# Patient Record
Sex: Male | Born: 1935 | Race: White | Hispanic: No | Marital: Married | Smoking: Former smoker
Health system: Southern US, Community
[De-identification: ages and names within clinical notes are randomized; demographics above are authoritative.]

## PROBLEM LIST (undated history)

## (undated) DIAGNOSIS — I10 Essential (primary) hypertension: Secondary | ICD-10-CM

## (undated) DIAGNOSIS — J984 Other disorders of lung: Secondary | ICD-10-CM

## (undated) DIAGNOSIS — M5030 Other cervical disc degeneration, unspecified cervical region: Secondary | ICD-10-CM

## (undated) DIAGNOSIS — I2510 Atherosclerotic heart disease of native coronary artery without angina pectoris: Secondary | ICD-10-CM

## (undated) DIAGNOSIS — D3500 Benign neoplasm of unspecified adrenal gland: Secondary | ICD-10-CM

## (undated) DIAGNOSIS — E785 Hyperlipidemia, unspecified: Secondary | ICD-10-CM

## (undated) DIAGNOSIS — I4891 Unspecified atrial fibrillation: Secondary | ICD-10-CM

## (undated) DIAGNOSIS — R911 Solitary pulmonary nodule: Secondary | ICD-10-CM

## (undated) DIAGNOSIS — I509 Heart failure, unspecified: Secondary | ICD-10-CM

## (undated) HISTORY — PX: REPLACEMENT TOTAL KNEE BILATERAL: SUR1225

## (undated) HISTORY — PX: CATARACT EXTRACTION, BILATERAL: SHX1313

## (undated) HISTORY — PX: EXPLORATION POST OPERATIVE OPEN HEART: SHX5061

## (undated) HISTORY — DX: Heart failure, unspecified: I50.9

## (undated) HISTORY — PX: CORONARY ARTERY BYPASS GRAFT: SHX141

## (undated) HISTORY — PX: CARDIAC SURGERY: SHX584

## (undated) HISTORY — PX: LUMBAR FUSION: SHX111

## (undated) HISTORY — DX: Essential (primary) hypertension: I10

---

## 2002-02-14 ENCOUNTER — Ambulatory Visit: Payer: Self-pay | Admitting: Unknown Physician Specialty

## 2005-01-19 ENCOUNTER — Ambulatory Visit: Payer: Self-pay | Admitting: *Deleted

## 2005-04-13 ENCOUNTER — Ambulatory Visit: Payer: Self-pay | Admitting: Internal Medicine

## 2005-09-02 ENCOUNTER — Ambulatory Visit: Payer: Self-pay | Admitting: Internal Medicine

## 2005-11-18 ENCOUNTER — Encounter: Payer: Self-pay | Admitting: Internal Medicine

## 2005-12-03 ENCOUNTER — Encounter: Payer: Self-pay | Admitting: Internal Medicine

## 2006-01-02 ENCOUNTER — Encounter: Payer: Self-pay | Admitting: Internal Medicine

## 2006-02-02 ENCOUNTER — Encounter: Payer: Self-pay | Admitting: Internal Medicine

## 2006-03-05 ENCOUNTER — Encounter: Payer: Self-pay | Admitting: Internal Medicine

## 2006-04-03 ENCOUNTER — Encounter: Payer: Self-pay | Admitting: Internal Medicine

## 2008-05-31 ENCOUNTER — Ambulatory Visit: Payer: Self-pay | Admitting: Unknown Physician Specialty

## 2010-09-03 ENCOUNTER — Ambulatory Visit: Payer: Self-pay | Admitting: Internal Medicine

## 2012-06-09 ENCOUNTER — Ambulatory Visit: Payer: Self-pay | Admitting: Internal Medicine

## 2013-06-19 ENCOUNTER — Other Ambulatory Visit: Payer: Self-pay | Admitting: Internal Medicine

## 2013-06-19 LAB — TROPONIN I: Troponin-I: 0.03 ng/mL

## 2013-06-23 ENCOUNTER — Ambulatory Visit: Payer: Self-pay | Admitting: Internal Medicine

## 2013-07-03 ENCOUNTER — Ambulatory Visit: Payer: Self-pay | Admitting: Internal Medicine

## 2013-07-07 ENCOUNTER — Ambulatory Visit: Payer: Self-pay | Admitting: Cardiothoracic Surgery

## 2013-07-12 LAB — CBC CANCER CENTER
Basophil #: 0 x10 3/mm (ref 0.0–0.1)
Basophil %: 0.5 %
EOS ABS: 0 x10 3/mm (ref 0.0–0.7)
EOS PCT: 0.1 %
HCT: 46.6 % (ref 40.0–52.0)
HGB: 15.5 g/dL (ref 13.0–18.0)
LYMPHS ABS: 1.2 x10 3/mm (ref 1.0–3.6)
LYMPHS PCT: 13.2 %
MCH: 32.5 pg (ref 26.0–34.0)
MCHC: 33.3 g/dL (ref 32.0–36.0)
MCV: 98 fL (ref 80–100)
Monocyte #: 0.5 x10 3/mm (ref 0.2–1.0)
Monocyte %: 5.9 %
Neutrophil #: 7.4 x10 3/mm — ABNORMAL HIGH (ref 1.4–6.5)
Neutrophil %: 80.3 %
Platelet: 186 x10 3/mm (ref 150–440)
RBC: 4.78 10*6/uL (ref 4.40–5.90)
RDW: 13.5 % (ref 11.5–14.5)
WBC: 9.2 x10 3/mm (ref 3.8–10.6)

## 2013-07-12 LAB — COMPREHENSIVE METABOLIC PANEL
ALBUMIN: 4 g/dL (ref 3.4–5.0)
ALK PHOS: 94 U/L
ANION GAP: 7 (ref 7–16)
BILIRUBIN TOTAL: 0.9 mg/dL (ref 0.2–1.0)
BUN: 15 mg/dL (ref 7–18)
CHLORIDE: 104 mmol/L (ref 98–107)
CREATININE: 0.68 mg/dL (ref 0.60–1.30)
Calcium, Total: 9.5 mg/dL (ref 8.5–10.1)
Co2: 29 mmol/L (ref 21–32)
EGFR (African American): >60
EGFR (Non-African Amer.): >60
GLUCOSE: 116 mg/dL — AB (ref 65–99)
OSMOLALITY: 281 (ref 275–301)
Potassium: 4 mmol/L (ref 3.5–5.1)
SGOT(AST): 16 U/L (ref 15–37)
SGPT (ALT): 28 U/L (ref 12–78)
Sodium: 140 mmol/L (ref 136–145)
Total Protein: 7.8 g/dL (ref 6.4–8.2)

## 2013-08-02 ENCOUNTER — Ambulatory Visit: Payer: Self-pay | Admitting: Cardiothoracic Surgery

## 2013-08-23 ENCOUNTER — Ambulatory Visit: Payer: Self-pay | Admitting: Internal Medicine

## 2013-11-16 ENCOUNTER — Ambulatory Visit: Payer: Self-pay | Admitting: Specialist

## 2015-01-07 ENCOUNTER — Other Ambulatory Visit
Admission: RE | Admit: 2015-01-07 | Discharge: 2015-01-07 | Disposition: A | Discharge status: Home / Self Care | Payer: Medicare Other | Source: Ambulatory Visit | Attending: Unknown Physician Specialty | Admitting: Unknown Physician Specialty

## 2015-01-07 DIAGNOSIS — Z96659 Presence of unspecified artificial knee joint: Secondary | ICD-10-CM | POA: Insufficient documentation

## 2015-01-07 LAB — SYNOVIAL CELL COUNT + DIFF, W/ CRYSTALS
Crystals, Fluid: NONE SEEN
Eosinophils-Synovial: 0 %
LYMPHOCYTES-SYNOVIAL FLD: 32 %
Monocyte-Macrophage-Synovial Fluid: 12 %
Neutrophil, Synovial: 56 %
Other Cells-SYN: 0
WBC, SYNOVIAL: 251 /mm3 — AB (ref 0–200)

## 2015-01-11 LAB — BODY FLUID CULTURE: Culture: NO GROWTH

## 2015-01-12 IMAGING — PT NM PET TUM IMG SKULL BASE T - THIGH
1 of 10 series · 1 of 25 positions shown · non-contrast
Comparison: 06/23/2013

CLINICAL DATA: Initial treatment strategy for lung nodule.

EXAM:
NUCLEAR MEDICINE PET SKULL BASE TO THIGH
TECHNIQUE: 12.9 mCi F-18 FDG was injected intravenously. Full-ring PET imaging
was performed from the skull base to thigh after the radiotracer. CT
data was obtained and used for attenuation correction and anatomic
localization.
FASTING BLOOD GLUCOSE:  Value: 102 mg/dl

[Series 3: ct wb 5.0 b30f · axial · 5.0mm · 0.98mm/px · 1 of 329 slices shown]
[im 329/329  brain]
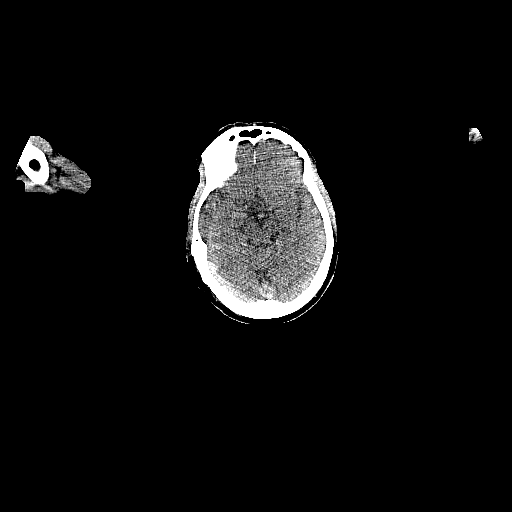

[1 of 25 positions shown; findings below may reference images not displayed]

FINDINGS: NECK

No hypermetabolic lymph nodes in the neck.

CHEST

9 mm right paratracheal lymph node has an SUV max equal to 4.1.
Pre-vascular lymph node measures 1 cm and has an SUV max equal to
3.7, image 80. SUV max associated with the right hilar lymph node is
equal to 3.7, image 90. The heart size appears enlarged. The patient
is status post median sternotomy and CABG procedure. Non-union
deformity involving the sternum is noted.

No pleural effusion identified. Right upper lobe pulmonary nodule
measures 1.5 cm, image 81/ series 3. The SUV max associated with
this is equal to 2.7, image 80. This is above background activity
within the lung parenchyma which is equal to approximately 1.3. The
SUV max associated with the right lower lobe subpleural nodule is
equal to 1.5, image 83.

ABDOMEN/PELVIS

No abnormal hypermetabolic activity within the liver, pancreas,
adrenal glands, or spleen. Stones identified within the gallbladder.
No hypermetabolic lymph nodes in the abdomen or pelvis.

SKELETON

No focal hypermetabolic activity to suggest skeletal metastasis.
IMPRESSION: 1. There is low level, malignant range FDG uptake associated with
the right upper lobe pulmonary nodule. Given the increase in size
from [DATE] this is worrisome for low grade pulmonary neoplasm.
2. Increased FDG uptake associated with right hilar and mediastinal
lymph nodes. The degree of uptake is as high at 1.5 times that of
background blood pool activity. Cannot rule out metastatic
adenopathy.
3. Atherosclerotic disease
4. Nonunion of the sternotomy defect.

## 2015-09-20 ENCOUNTER — Ambulatory Visit
Admission: RE | Admit: 2015-09-20 | Discharge: 2015-09-20 | Disposition: A | Discharge status: Home / Self Care | Payer: Medicare Other | Source: Ambulatory Visit | Attending: Internal Medicine | Admitting: Internal Medicine

## 2015-09-20 ENCOUNTER — Other Ambulatory Visit: Payer: Self-pay | Admitting: Internal Medicine

## 2015-09-20 DIAGNOSIS — R0602 Shortness of breath: Secondary | ICD-10-CM

## 2015-09-20 DIAGNOSIS — K8020 Calculus of gallbladder without cholecystitis without obstruction: Secondary | ICD-10-CM | POA: Insufficient documentation

## 2015-09-20 DIAGNOSIS — R109 Unspecified abdominal pain: Secondary | ICD-10-CM

## 2015-09-20 DIAGNOSIS — I700 Atherosclerosis of aorta: Secondary | ICD-10-CM | POA: Insufficient documentation

## 2015-09-20 DIAGNOSIS — M4640 Discitis, unspecified, site unspecified: Secondary | ICD-10-CM | POA: Insufficient documentation

## 2015-09-20 DIAGNOSIS — M5135 Other intervertebral disc degeneration, thoracolumbar region: Secondary | ICD-10-CM | POA: Insufficient documentation

## 2015-09-20 DIAGNOSIS — M4646 Discitis, unspecified, lumbar region: Secondary | ICD-10-CM

## 2015-10-09 ENCOUNTER — Other Ambulatory Visit: Payer: Self-pay | Admitting: Internal Medicine

## 2015-10-09 DIAGNOSIS — M545 Low back pain: Secondary | ICD-10-CM

## 2015-12-10 ENCOUNTER — Other Ambulatory Visit: Payer: Self-pay | Admitting: Neurological Surgery

## 2015-12-10 DIAGNOSIS — M545 Low back pain, unspecified: Secondary | ICD-10-CM

## 2015-12-10 DIAGNOSIS — G8929 Other chronic pain: Secondary | ICD-10-CM

## 2015-12-12 ENCOUNTER — Ambulatory Visit: Payer: Medicare Other

## 2015-12-24 ENCOUNTER — Other Ambulatory Visit (INDEPENDENT_AMBULATORY_CARE_PROVIDER_SITE_OTHER): Payer: Self-pay | Admitting: Vascular Surgery

## 2015-12-24 ENCOUNTER — Encounter (INDEPENDENT_AMBULATORY_CARE_PROVIDER_SITE_OTHER): Payer: Self-pay

## 2015-12-24 ENCOUNTER — Ambulatory Visit (INDEPENDENT_AMBULATORY_CARE_PROVIDER_SITE_OTHER): Payer: Medicare Other | Admitting: Vascular Surgery

## 2015-12-24 ENCOUNTER — Encounter (INDEPENDENT_AMBULATORY_CARE_PROVIDER_SITE_OTHER): Payer: Self-pay | Admitting: Vascular Surgery

## 2015-12-24 VITALS — BP 181/94 | HR 82 | Resp 16 | Ht 72.0 in | Wt 255.0 lb

## 2015-12-24 DIAGNOSIS — I10 Essential (primary) hypertension: Secondary | ICD-10-CM | POA: Diagnosis not present

## 2015-12-24 DIAGNOSIS — I25810 Atherosclerosis of coronary artery bypass graft(s) without angina pectoris: Secondary | ICD-10-CM | POA: Diagnosis not present

## 2015-12-24 DIAGNOSIS — I7025 Atherosclerosis of native arteries of other extremities with ulceration: Secondary | ICD-10-CM

## 2015-12-24 DIAGNOSIS — I509 Heart failure, unspecified: Secondary | ICD-10-CM

## 2015-12-24 DIAGNOSIS — I499 Cardiac arrhythmia, unspecified: Secondary | ICD-10-CM | POA: Insufficient documentation

## 2015-12-24 DIAGNOSIS — I4891 Unspecified atrial fibrillation: Secondary | ICD-10-CM | POA: Diagnosis not present

## 2015-12-24 NOTE — Assessment & Plan Note (Signed)
Has an appointment with cardiology tomorrow.

## 2015-12-24 NOTE — Assessment & Plan Note (Signed)
blood pressure control important in reducing the progression of atherosclerotic disease. On appropriate oral medications.  

## 2015-12-24 NOTE — Assessment & Plan Note (Signed)
On anticoagulation 

## 2015-12-24 NOTE — Patient Instructions (Signed)

## 2015-12-24 NOTE — Assessment & Plan Note (Signed)
The patient has ulcerations of both feet. His ulcerations are more severe on the right great and second toe than on the left, but on the left he has 3 small heel ulcerations which are more painful. His pedal pulse exam is poor and his foot has the appearance of chronic ischemia. This is a very worrisome and limb threatening situation and a very ill patient with multiple medical issues. Given the situation, limb threat is clearly present and he is very concerned about losing his legs. I have discussed options for treatment including expediting an angiogram. I discussed an angiogram would be used for one lower extremity at a time, and would be both diagnostic and potentially therapeutic. I discussed the risks and benefits of the procedure. I have discussed the gravity of the situation and discussed with the patient that even with revascularization, tissue loss is possible. The patient has a large list of medical comorbidities which make the situation more worrisome. He and his wife have decided to proceed with angiography and possible revascularization. Risks and benefits were discussed.

## 2015-12-24 NOTE — Progress Notes (Signed)
Patient ID: Norman Morales, male   DOB: 16-Sep-1926, 82 y.o.   MRN: [id number]  Chief Complaint  Patient presents with  . New Patient (Initial Visit)    HPI Norman Morales is a 82 y.o. male.  I am asked to see the patient by Caryl Comes for evaluation of lower extremity ischemia with ulceration.  The patient reports several ulcerations on both feet that have started over the past several weeks. He said he had an episode of worsening swelling of his legs likely associated with his congestive heart failure, and has had worsening of the ulceration since that time. Nothing seems to be making them better. The patient has ulcerations of both feet. His ulcerations are more severe on the right great and second toe than on the left, but on the left he has 3 small heel ulcerations which are more painful. His feet are very purplish discolored. He reports no trauma or injury. He does not have any fever or chills or signs systemic infection.   Past Medical History:  Diagnosis Date  . CHF (congestive heart failure) ([place])   . Hypertension     Past Surgical History:  Procedure Laterality Date  . CATARACT EXTRACTION, BILATERAL    . EXPLORATION POST OPERATIVE OPEN HEART    . LUMBAR FUSION    . REPLACEMENT TOTAL KNEE BILATERAL      Family History  Problem Relation Age of Onset  . Aortic aneurysm Mother   No bleeding disorders, clotting disorders, or autoimmune diseases  Social History Social History  Substance Use Topics  . Smoking status: Former Research scientist (life sciences)  . Smokeless tobacco: Never Used  . Alcohol use Yes     Comment: occasionally  Married, lives with wife. Retired physical therapist  No Known Allergies  Current Outpatient Prescriptions  Medication Sig Dispense Refill  . albuterol (PROVENTIL HFA;VENTOLIN HFA) 108 (90 Base) MCG/ACT inhaler INHALE TWO INHALATIONS INTO THE LUNGS EVERY 6 HOURS AS NEEDED FOR WHEEZING    . allopurinol (ZYLOPRIM) 100 MG tablet TAKE 2 TABLETS BY MOUTH EVERY DAY AS  DIRECTED    . aspirin EC 81 MG tablet Take 81 mg by mouth.    Marland Kitchen atorvastatin (LIPITOR) 80 MG tablet TAKE ONE TABLET AT BEDTIME    . azelastine (ASTELIN) 0.1 % nasal spray Place into the nose.    . bumetanide (BUMEX) 2 MG tablet TAKE 1 TABLET BY MOUTH EVERY DAY.    Marland Kitchen Cyanocobalamin (RA VITAMIN B-12 TR) 1000 MCG TBCR Take by mouth.    Arne Cleveland 5 MG TABS tablet     . metoprolol tartrate (LOPRESSOR) 25 MG tablet     . spironolactone (ALDACTONE) 25 MG tablet      No current facility-administered medications for this visit.       REVIEW OF SYSTEMS (Negative unless checked)  Constitutional: [] Weight loss  [] Fever  [] Chills Cardiac: [] Chest pain   [] Chest pressure   [x] Palpitations   [x] Shortness of breath when laying flat   [] Shortness of breath at rest   [x] Shortness of breath with exertion. Vascular:  [] Pain in legs with walking   [] Pain in legs at rest   [] Pain in legs when laying flat   [] Claudication   [x] Pain in feet when walking  [] Pain in feet at rest  [] Pain in feet when laying flat   [] History of DVT   [] Phlebitis   [x] Swelling in legs   [] Varicose veins   [] Non-healing ulcers Pulmonary:   [] Uses home oxygen   [] Productive cough   []   Hemoptysis   [] Wheeze  [] COPD   [] Asthma Neurologic:  [] Dizziness  [] Blackouts   [] Seizures   [] History of stroke   [] History of TIA  [] Aphasia   [] Temporary blindness   [] Dysphagia   [] Weakness or numbness in arms   [] Weakness or numbness in legs Musculoskeletal:  [] Arthritis   [] Joint swelling   [x] Joint pain   [] Low back pain Hematologic:  [] Easy bruising  [] Easy bleeding   [] Hypercoagulable state   [] Anemic  [] Hepatitis Gastrointestinal:  [] Blood in stool   [] Vomiting blood  [] Gastroesophageal reflux/heartburn   [] Abdominal pain Genitourinary:  [] Chronic kidney disease   [] Difficult urination  [] Frequent urination  [] Burning with urination   [] Hematuria Skin:  [] Rashes   [x] Ulcers   [x] Wounds Psychological:  [] History of anxiety   []  History of major  depression.    Physical Exam BP (!) 181/94   Pulse 82   Resp 16   Ht 6' (1.829 m)   Wt 255 lb (115.7 kg)   BMI 34.58 kg/m  Gen:  WD/WN, NAD Head: [place]/AT, No temporalis wasting. Prominent temp pulse not noted. Ear/Nose/Throat: Hearing grossly intact, nares w/o erythema or drainage, oropharynx w/o Erythema/Exudate Eyes: Conjunctiva clear, sclera non-icteric  Neck: trachea midline.  No bruit or JVD.  Pulmonary:  Good air movement, clear to auscultation bilaterally.  Cardiac: Irregularly irregular Vascular:  Vessel Right Left  Radial Palpable Palpable  Ulnar Palpable Palpable  Brachial Palpable Palpable  Carotid Palpable, without bruit Palpable, without bruit  Aorta Not palpable N/A  Femoral Palpable Palpable  Popliteal Not Palpable Not Palpable  PT Trace Palpable Not Palpable  DP Trace Palpable 1+ Palpable   Gastrointestinal: soft, non-tender/non-distended. No guarding/reflex. No masses, surgical incisions, or scars. Musculoskeletal: M/S 5/5 throughout.  Clean dry ulcerations as described above on both feet.  No deformity or atrophy. Trace right lower extremity edema and 1-2+ left lower extremity edema. Neurologic: Sensation grossly intact in extremities.  Symmetrical.  Speech is fluent. Motor exam as listed above. Psychiatric: Judgment intact, Mood & affect appropriate for pt's clinical situation. Dermatologic: Clean dry ulcerations on both feet as described above. No erythema or drainage. Lymph : No Cervical, Axillary, or Inguinal lymphadenopathy.   Radiology No results found.  Labs No results found for this or any previous visit (from the past 2160 hour(s)).  Assessment/Plan:  Arrhythmia On anticoagulation  Essential hypertension blood pressure control important in reducing the progression of atherosclerotic disease. On appropriate oral medications.   Congestive heart failure (HCC) On diuretics but still has issues with swelling which can exacerbate his lower  extremity ulcerations. Very worrisome situation. Overall perfusion could be impaired to his feet from cardiac dysfunction as well.  Atherosclerosis of native arteries of the extremities with ulceration ([place]) The patient has ulcerations of both feet. His ulcerations are more severe on the right great and second toe than on the left, but on the left he has 3 small heel ulcerations which are more painful. His pedal pulse exam is poor and his foot has the appearance of chronic ischemia. This is a very worrisome and limb threatening situation and a very ill patient with multiple medical issues. Given the situation, limb threat is clearly present and he is very concerned about losing his legs. I have discussed options for treatment including expediting an angiogram. I discussed an angiogram would be used for one lower extremity at a time, and would be both diagnostic and potentially therapeutic. I discussed the risks and benefits of the procedure. I have discussed the  gravity of the situation and discussed with the patient that even with revascularization, tissue loss is possible. The patient has a large list of medical comorbidities which make the situation more worrisome. He and his wife have decided to proceed with angiography and possible revascularization. Risks and benefits were discussed.  CAD (coronary artery disease) of artery bypass graft Has an appointment with cardiology tomorrow.      Leotis Pain 12/24/2015, 10:09 AM   This note was created with Dragon medical transcription system.  Any errors from dictation are unintentional.

## 2015-12-24 NOTE — Assessment & Plan Note (Signed)
On diuretics but still has issues with swelling which can exacerbate his lower extremity ulcerations. Very worrisome situation. Overall perfusion could be impaired to his feet from cardiac dysfunction as well.

## 2015-12-25 ENCOUNTER — Inpatient Hospital Stay: Admission: RE | Admit: 2015-12-25 | Payer: Medicare Other | Source: Ambulatory Visit

## 2015-12-27 ENCOUNTER — Other Ambulatory Visit
Admission: RE | Admit: 2015-12-27 | Discharge: 2015-12-27 | Disposition: A | Discharge status: Home / Self Care | Payer: Medicare Other | Source: Ambulatory Visit | Attending: Vascular Surgery | Admitting: Vascular Surgery

## 2015-12-27 DIAGNOSIS — I739 Peripheral vascular disease, unspecified: Secondary | ICD-10-CM | POA: Diagnosis present

## 2015-12-27 LAB — CREATININE, SERUM
Creatinine, Ser: 1.1 mg/dL (ref 0.61–1.24)
GFR calc Af Amer: >60 mL/min (ref >60)
GFR calc non Af Amer: 58 mL/min — ABNORMAL LOW (ref >60)

## 2015-12-27 LAB — BUN: BUN: 28 mg/dL — AB (ref 6–20)

## 2015-12-30 ENCOUNTER — Encounter: Payer: Self-pay | Admitting: *Deleted

## 2015-12-30 ENCOUNTER — Encounter
Admission: RE | Disposition: A | Discharge status: Home / Self Care | Payer: Self-pay | Source: Ambulatory Visit | Attending: Vascular Surgery

## 2015-12-30 ENCOUNTER — Ambulatory Visit
Admission: RE | Admit: 2015-12-30 | Discharge: 2015-12-30 | Disposition: A | Discharge status: Home / Self Care | Payer: Medicare Other | Source: Ambulatory Visit | Attending: Vascular Surgery | Admitting: Vascular Surgery

## 2015-12-30 DIAGNOSIS — Z981 Arthrodesis status: Secondary | ICD-10-CM | POA: Insufficient documentation

## 2015-12-30 DIAGNOSIS — I509 Heart failure, unspecified: Secondary | ICD-10-CM | POA: Diagnosis not present

## 2015-12-30 DIAGNOSIS — Z9841 Cataract extraction status, right eye: Secondary | ICD-10-CM | POA: Insufficient documentation

## 2015-12-30 DIAGNOSIS — I7025 Atherosclerosis of native arteries of other extremities with ulceration: Secondary | ICD-10-CM | POA: Insufficient documentation

## 2015-12-30 DIAGNOSIS — I110 Hypertensive heart disease with heart failure: Secondary | ICD-10-CM | POA: Diagnosis not present

## 2015-12-30 DIAGNOSIS — Z7982 Long term (current) use of aspirin: Secondary | ICD-10-CM | POA: Insufficient documentation

## 2015-12-30 DIAGNOSIS — Z96653 Presence of artificial knee joint, bilateral: Secondary | ICD-10-CM | POA: Diagnosis not present

## 2015-12-30 DIAGNOSIS — Z8249 Family history of ischemic heart disease and other diseases of the circulatory system: Secondary | ICD-10-CM | POA: Insufficient documentation

## 2015-12-30 DIAGNOSIS — Z87891 Personal history of nicotine dependence: Secondary | ICD-10-CM | POA: Insufficient documentation

## 2015-12-30 DIAGNOSIS — I2510 Atherosclerotic heart disease of native coronary artery without angina pectoris: Secondary | ICD-10-CM | POA: Insufficient documentation

## 2015-12-30 DIAGNOSIS — I70248 Atherosclerosis of native arteries of left leg with ulceration of other part of lower left leg: Secondary | ICD-10-CM

## 2015-12-30 DIAGNOSIS — Z9842 Cataract extraction status, left eye: Secondary | ICD-10-CM | POA: Insufficient documentation

## 2015-12-30 HISTORY — DX: Atherosclerotic heart disease of native coronary artery without angina pectoris: I25.10

## 2015-12-30 HISTORY — DX: Hyperlipidemia, unspecified: E78.5

## 2015-12-30 HISTORY — PX: PERIPHERAL VASCULAR CATHETERIZATION: SHX172C

## 2015-12-30 SURGERY — LOWER EXTREMITY ANGIOGRAPHY
Anesthesia: Moderate Sedation | Site: Leg Lower | Laterality: Left

## 2015-12-30 MED ORDER — HEPARIN (PORCINE) IN NACL 2-0.9 UNIT/ML-% IJ SOLN
INTRAMUSCULAR | Status: AC
  Filled 2015-12-30: qty 1000

## 2015-12-30 MED ORDER — HEPARIN SODIUM (PORCINE) 1000 UNIT/ML IJ SOLN
INTRAMUSCULAR | Status: AC
  Filled 2015-12-30: qty 1

## 2015-12-30 MED ORDER — FENTANYL CITRATE (PF) 100 MCG/2ML IJ SOLN
INTRAMUSCULAR | Status: AC
  Filled 2015-12-30: qty 2

## 2015-12-30 MED ORDER — IPRATROPIUM-ALBUTEROL 0.5-2.5 (3) MG/3ML IN SOLN
RESPIRATORY_TRACT | Status: AC
  Filled 2015-12-30: qty 3

## 2015-12-30 MED ORDER — ONDANSETRON HCL 4 MG/2ML IJ SOLN: 4.0000 mg | Freq: Four times a day (QID) | INTRAMUSCULAR | Status: DC | PRN

## 2015-12-30 MED ORDER — GUAIFENESIN-DM 100-10 MG/5ML PO SYRP: 15.0000 mL | ORAL_SOLUTION | ORAL | Status: DC | PRN

## 2015-12-30 MED ORDER — OXYCODONE-ACETAMINOPHEN 5-325 MG PO TABS
1.0000 | ORAL_TABLET | ORAL | Status: DC | PRN
  Administered 2015-12-30: 1 via ORAL

## 2015-12-30 MED ORDER — MIDAZOLAM HCL 2 MG/2ML IJ SOLN
INTRAMUSCULAR | Status: DC | PRN
  Administered 2015-12-30 (×3): 1 mg via INTRAVENOUS
  Administered 2015-12-30: 0.5 mg via INTRAVENOUS
  Administered 2015-12-30: 2 mg via INTRAVENOUS
  Administered 2015-12-30: 1 mg via INTRAVENOUS
  Administered 2015-12-30: 0.5 mg via INTRAVENOUS

## 2015-12-30 MED ORDER — LIDOCAINE HCL (PF) 1 % IJ SOLN
INTRAMUSCULAR | Status: AC
  Filled 2015-12-30: qty 10

## 2015-12-30 MED ORDER — ACETAMINOPHEN 325 MG PO TABS: 325.0000 mg | ORAL_TABLET | ORAL | Status: DC | PRN

## 2015-12-30 MED ORDER — IOPAMIDOL (ISOVUE-300) INJECTION 61%
INTRAVENOUS | Status: DC | PRN
  Administered 2015-12-30: 75 mL via INTRA_ARTERIAL

## 2015-12-30 MED ORDER — METOPROLOL TARTRATE 5 MG/5ML IV SOLN: 2.0000 mg | INTRAVENOUS | Status: DC | PRN

## 2015-12-30 MED ORDER — OXYCODONE-ACETAMINOPHEN 5-325 MG PO TABS
ORAL_TABLET | ORAL | Status: AC
  Filled 2015-12-30: qty 1

## 2015-12-30 MED ORDER — HEPARIN SODIUM (PORCINE) 1000 UNIT/ML IJ SOLN
INTRAMUSCULAR | Status: DC | PRN
  Administered 2015-12-30: 5000 [IU] via INTRAVENOUS

## 2015-12-30 MED ORDER — HYDRALAZINE HCL 20 MG/ML IJ SOLN: 5.0000 mg | INTRAMUSCULAR | Status: DC | PRN

## 2015-12-30 MED ORDER — ACETAMINOPHEN 325 MG RE SUPP
325.0000 mg | RECTAL | Status: DC | PRN
Start: 2015-12-30 — End: 2015-12-30
  Filled 2015-12-30: qty 2

## 2015-12-30 MED ORDER — LABETALOL HCL 5 MG/ML IV SOLN: 10.0000 mg | INTRAVENOUS | Status: DC | PRN

## 2015-12-30 MED ORDER — METHYLPREDNISOLONE SODIUM SUCC 125 MG IJ SOLR: 125.0000 mg | INTRAMUSCULAR | Status: DC | PRN

## 2015-12-30 MED ORDER — SODIUM CHLORIDE 0.9 % IV SOLN: 500.0000 mL | Freq: Once | INTRAVENOUS | Status: DC | PRN

## 2015-12-30 MED ORDER — DEXTROSE 5 % IV SOLN
1.5000 g | INTRAVENOUS | Status: AC
  Administered 2015-12-30: 1.5 g via INTRAVENOUS

## 2015-12-30 MED ORDER — SODIUM CHLORIDE 0.9 % IV SOLN
INTRAVENOUS | Status: DC
  Administered 2015-12-30: 11:00:00 via INTRAVENOUS

## 2015-12-30 MED ORDER — MIDAZOLAM HCL 5 MG/5ML IJ SOLN
INTRAMUSCULAR | Status: AC
  Filled 2015-12-30: qty 5

## 2015-12-30 MED ORDER — IPRATROPIUM-ALBUTEROL 0.5-2.5 (3) MG/3ML IN SOLN
3.0000 mL | Freq: Once | RESPIRATORY_TRACT | Status: AC
  Administered 2015-12-30: 3 mL via RESPIRATORY_TRACT

## 2015-12-30 MED ORDER — MIDAZOLAM HCL 2 MG/2ML IJ SOLN
INTRAMUSCULAR | Status: AC
  Filled 2015-12-30: qty 2

## 2015-12-30 MED ORDER — HYDROMORPHONE HCL 1 MG/ML IJ SOLN: 0.5000 mg | INTRAMUSCULAR | Status: DC | PRN

## 2015-12-30 MED ORDER — PHENOL 1.4 % MT LIQD: 1.0000 | OROMUCOSAL | Status: DC | PRN

## 2015-12-30 MED ORDER — FENTANYL CITRATE (PF) 100 MCG/2ML IJ SOLN
INTRAMUSCULAR | Status: DC | PRN
  Administered 2015-12-30: 25 ug via INTRAVENOUS
  Administered 2015-12-30: 50 ug via INTRAVENOUS
  Administered 2015-12-30 (×2): 25 ug via INTRAVENOUS
  Administered 2015-12-30: 50 ug via INTRAVENOUS
  Administered 2015-12-30 (×2): 25 ug via INTRAVENOUS
  Administered 2015-12-30: 50 ug via INTRAVENOUS

## 2015-12-30 MED ORDER — HYDROMORPHONE HCL 1 MG/ML IJ SOLN: 1.0000 mg | Freq: Once | INTRAMUSCULAR | Status: DC

## 2015-12-30 MED ORDER — FAMOTIDINE 20 MG PO TABS: 40.0000 mg | ORAL_TABLET | ORAL | Status: DC | PRN

## 2015-12-30 MED ORDER — HYDRALAZINE HCL 20 MG/ML IJ SOLN
INTRAMUSCULAR | Status: AC
  Filled 2015-12-30: qty 1

## 2015-12-30 MED ORDER — HYDRALAZINE HCL 20 MG/ML IJ SOLN
INTRAMUSCULAR | Status: DC | PRN
  Administered 2015-12-30: 20 mg via INTRAVENOUS

## 2015-12-30 SURGICAL SUPPLY — 31 items
BALLN ARMADA 5.0X60X150 (BALLOONS) ×4
BALLN LUTONIX 5X150X130 (BALLOONS) ×4
BALLN LUTONIX DCB 4X100X130 (BALLOONS) ×8
BALLN LUTONIX DCB 7X60X130 (BALLOONS) ×4
BALLN ULTRVRSE 018 2.5X100X150 (BALLOONS) ×4
BALLN ULTRVRSE 3X150X150 (BALLOONS) ×4
BALLOON ARMADA 5.0X60X150 (BALLOONS) ×2 IMPLANT
BALLOON LUTONIX 5X150X130 (BALLOONS) ×2 IMPLANT
BALLOON LUTONIX DCB 4X100X130 (BALLOONS) ×4 IMPLANT
BALLOON LUTONIX DCB 7X60X130 (BALLOONS) ×2 IMPLANT
BALLOON ULTRVRSE 3X150X150 (BALLOONS) ×2 IMPLANT
BALLOON ULTRVS 018 2.5X100X150 (BALLOONS) ×2 IMPLANT
CANNULA 5F STIFF (CANNULA) ×4 IMPLANT
CATH CXI SUPP ANG 4FR 135 (MICROCATHETER) ×2 IMPLANT
CATH CXI SUPP ANG 4FR 135CM (MICROCATHETER) ×4
CATH CXI SUPP ST 4FR 135CM (MICROCATHETER) ×4 IMPLANT
CATH PIG 70CM (CATHETERS) ×4 IMPLANT
CATH QUICKCROSS .035X135CM (MICROCATHETER) ×4 IMPLANT
CATH VERT 100CM (CATHETERS) ×4 IMPLANT
DEVICE PRESTO INFLATION (MISCELLANEOUS) ×4 IMPLANT
DEVICE STARCLOSE SE CLOSURE (Vascular Products) ×4 IMPLANT
GLIDEWIRE ADV .035X260CM (WIRE) ×4 IMPLANT
GUIDEWIRE PFTE-COATED .018X300 (WIRE) ×4 IMPLANT
PACK ANGIOGRAPHY (CUSTOM PROCEDURE TRAY) ×4 IMPLANT
SHEATH ANL2 6FRX45 HC (SHEATH) ×4 IMPLANT
SHEATH BRITE TIP 5FRX11 (SHEATH) ×4 IMPLANT
SHEATH RAABE 6FRX70 (SHEATH) ×4 IMPLANT
SYR MEDRAD MARK V 150ML (SYRINGE) ×4 IMPLANT
TUBING CONTRAST HIGH PRESS 72 (TUBING) ×4 IMPLANT
WIRE G V18X300CM (WIRE) ×4 IMPLANT
WIRE J 3MM .035X145CM (WIRE) ×4 IMPLANT

## 2015-12-30 NOTE — H&P (Signed)
[facility] History & Physical Update  The patient was interviewed and re-examined.  The patient's previous History and Physical has been reviewed and is unchanged.  There is no change in the plan of care. We plan to proceed with the scheduled procedure.  Leotis Pain, MD  12/30/2015, 11:51 AM

## 2015-12-30 NOTE — Op Note (Signed)
[facility] Percutaneous Study/Intervention Procedural Note   Date of Surgery: 12/30/2015  Surgeon(s):Shynia Daleo   Assistants:none  Pre-operative Diagnosis: PAD with ulceration bilateral lower extremities  Post-operative diagnosis: Same  Procedure(s) Performed: 1. Ultrasound guidance for vascular access right femoral artery 2. Catheter placement into left anterior tibial artery from right femoral approach 3. Aortogram and selective left lower extremity angiogram 4. Percutaneous transluminal angioplasty of proximal anterior tibial artery with 2.5 mm diameter conventional and 4 mm diameter Lutonix drug-coated balloon 5. Percutaneous transluminal angioplasty of the left popliteal artery with 5 mm diameter Lutonix drug-coated angioplasty balloon  6.  Percutaneous transluminal angioplasty of the proximal left SFA with 7 mm diameter Lutonix drug-coated angioplasty balloon 7. StarClose closure device right femoral artery  EBL: 25 cc  Contrast: 75 cc  Fluoro Time: 18.7 minutes  Moderate Conscious Sedation Time: approximately 70 minutes using 7 mg of Versed and 275 mcg of Fentanyl  Indications: Patient is a 82 y.o.male with nonhealing ulcerations and cyanosis of both feet. The patient is brought in for angiography for further evaluation and potential treatment. Risks and benefits are discussed and informed consent is obtained  Procedure: The patient was identified and appropriate procedural time out was performed. The patient was then placed supine on the table and prepped and draped in the usual sterile fashion.Moderate conscious sedation was administered during a face to face encounter with the patient throughout the procedure with my supervision of the RN administering medicines and monitoring the patient's vital signs, pulse oximetry, telemetry and mental status  throughout from the start of the procedure until the patient was taken to the recovery room. Ultrasound was used to evaluate the right common femoral artery. It was patent but highly calcific. A digital ultrasound image was acquired. A Seldinger needle was used to access the right common femoral artery under direct ultrasound guidance and a permanent image was performed. A 0.035 J wire was advanced without resistance and a 5Fr sheath was placed. Pigtail catheter was placed into the aorta and an AP aortogram was performed. This demonstrated a >60% right renal artery stenosis and a normal left renal artery and normal aorta and iliac segments without significant stenosis. I then crossed the aortic bifurcation and advanced to the left femoral head. Selective left lower extremity angiogram was then performed. This demonstrated moderate stenosis in the origin and proximal SFA in the 50-60% range. The popliteal artery was difficult to evaluate secondary to his knee prosthesis, but with flexed oblique imaging there was clearly a near occlusive stenosis in the mid popliteal artery at just above the knee. The vessel then normalized and the origin of all 3 tibial vessels was a very calcific occlusion. The anterior tibial artery reconstituted early in its course and was the best runoff to the foot. The peroneal artery also provided a secondary runoff vessel when it reconstituted in its proximal segment but was smaller. The posterior tibial artery appeared to have the least flow when it reconstituted. The patient was systemically heparinized and a 6 [place] Ansell sheath was then placed over the [employer] wire. I then used a Kumpe catheter and the advantage wire to navigate through the SFA and popliteal lesions. After a significant amount of difficulty I was able to get the advantage wire across the occlusion in the anterior tibial artery. Initially, a wire would not track and we exchanged for a 0.018 wire. Multiple  catheters were tried and eventually I was able to get a quick cross catheter across  the occlusion and confirm intraluminal flow in the mid anterior tibial artery. A V 18 wire was then replaced. Intervention was performed using a 2.5 mm diameter by 15 cm length angioplasty balloon in the anterior tibial artery and distal popliteal artery. This was inflated to 14 atm for 1 minute. High-grade residual stenosis persisted, and a 4 mm diameter by 10 cm length Lutonix drug-coated angioplasty balloon was used to treat the proximal anterior tibial artery and the distal popliteal artery. This was inflated to 10 atm for 1 minute. I then used a 5 mm diameter by 15 cm length Lutonix drug-coated angioplasty balloon to treat the popliteal stenosis from the below-knee popliteal artery up to the proximal popliteal artery. This was inflated to 12 atm for 1 minute. Completion and gram following this which again was difficult to see secondary to his knee prosthesis, but appeared to have markedly improved flow with only about a 20% residual stenosis in the popliteal artery and a 40% residual stenosis at the origin of the anterior tibial artery but brisk flow was seen and there did not appear to be residual high-grade stenosis. I then turned my attention to the proximal SFA for the moderate lesion air. A 7 mm diameter by 6 cm length Lutonix drug-coated angioplasty balloon was inflated to 10 atm for 1 minute. Completion angiogram showed about a 20% residual stenosis. I elected to terminate the procedure. The sheath was removed and StarClose closure device was deployed in the right femoral artery with excellent hemostatic result. The patient was taken to the recovery room in stable condition having tolerated the procedure well.  Findings:  Aortogram: right renal artery stenosis of >60%, no left renal artery stenosis, aorta widely patent, iliac with marked calcification, but no significant stenosis. Left Lower  Extremity: Moderate stenosis in the origin and proximal SFA in the 50-60% range. The popliteal artery was difficult to evaluate secondary to his knee prosthesis, but with flexed oblique imaging there was clearly a near occlusive stenosis in the mid popliteal artery at just above the knee. The vessel then normalized and the origin of all 3 tibial vessels was a very calcific occlusion. The anterior tibial artery reconstituted early in its course and was the best runoff to the foot. The peroneal artery also provided a secondary runoff vessel when it reconstituted in its proximal segment but was smaller. The posterior tibial artery appeared to have the least flow when it reconstituted.   Disposition: Patient was taken to the recovery room in stable condition having tolerated the procedure well.  Complications: None  Norman Morales 12/30/2015 1:41 PM   This note was created with Dragon Medical transcription system. Any errors in dictation are purely unintentional.

## 2015-12-30 NOTE — Discharge Instructions (Signed)

## 2015-12-30 NOTE — Progress Notes (Signed)
Patient placed on auto CPAP with 2l bleed for procedure, pt tol well at this time.

## 2015-12-30 NOTE — Progress Notes (Signed)
Pt clinically stable post angiogram , no bleeding nor hematoma at right groin site, Dr Lucky Cowboy out to speak with family and patient with questions answered, pt to come back next week for second procedure.

## 2015-12-31 ENCOUNTER — Other Ambulatory Visit (INDEPENDENT_AMBULATORY_CARE_PROVIDER_SITE_OTHER): Payer: Self-pay | Admitting: Vascular Surgery

## 2015-12-31 ENCOUNTER — Encounter: Payer: Self-pay | Admitting: Vascular Surgery

## 2016-01-02 ENCOUNTER — Telehealth (INDEPENDENT_AMBULATORY_CARE_PROVIDER_SITE_OTHER): Payer: Self-pay

## 2016-01-02 NOTE — Telephone Encounter (Signed)
Patient called yesterday to cancel his angio procedure scheduled for 01/06/16. Stating he has a cold and is getting fitted for a CPAP machine and he would call back to reschedule. Per the note left on my desk by Otila Kluver.

## 2016-01-06 ENCOUNTER — Ambulatory Visit: Admission: RE | Admit: 2016-01-06 | Payer: Medicare Other | Source: Ambulatory Visit | Admitting: Vascular Surgery

## 2016-01-06 ENCOUNTER — Encounter: Admission: RE | Payer: Self-pay | Source: Ambulatory Visit

## 2016-01-06 SURGERY — LOWER EXTREMITY ANGIOGRAPHY
Anesthesia: Moderate Sedation | Site: Leg Lower | Laterality: Right

## 2016-03-23 ENCOUNTER — Telehealth (INDEPENDENT_AMBULATORY_CARE_PROVIDER_SITE_OTHER): Payer: Self-pay | Admitting: Vascular Surgery

## 2016-03-23 NOTE — Telephone Encounter (Signed)
Attempted to contact patient but the number goes straight to the mailbox which is full and cannot except any messages.

## 2016-03-23 NOTE — Telephone Encounter (Signed)
Per wife calling pt had angio done 11.29.17 and pt is having redness,swelling,painful and drainage. [place] but need to speak to nurse.Please advise [phone]

## 2016-03-27 ENCOUNTER — Encounter (INDEPENDENT_AMBULATORY_CARE_PROVIDER_SITE_OTHER): Payer: Self-pay | Admitting: Vascular Surgery

## 2016-03-27 ENCOUNTER — Ambulatory Visit (INDEPENDENT_AMBULATORY_CARE_PROVIDER_SITE_OTHER): Payer: Medicare Other | Admitting: Vascular Surgery

## 2016-03-27 VITALS — BP 190/96 | HR 74 | Resp 16 | Ht 72.0 in | Wt 261.0 lb

## 2016-03-27 DIAGNOSIS — M7989 Other specified soft tissue disorders: Secondary | ICD-10-CM | POA: Diagnosis not present

## 2016-03-27 DIAGNOSIS — I4891 Unspecified atrial fibrillation: Secondary | ICD-10-CM

## 2016-03-27 DIAGNOSIS — I10 Essential (primary) hypertension: Secondary | ICD-10-CM | POA: Diagnosis not present

## 2016-03-27 DIAGNOSIS — I7025 Atherosclerosis of native arteries of other extremities with ulceration: Secondary | ICD-10-CM | POA: Diagnosis not present

## 2016-03-27 DIAGNOSIS — I509 Heart failure, unspecified: Secondary | ICD-10-CM

## 2016-03-27 NOTE — Assessment & Plan Note (Signed)
Needs to have his ABIs rechecked. We will do at his follow-up visits.

## 2016-03-27 NOTE — Assessment & Plan Note (Signed)
His swelling is markedly worse today. He needs to have his legs wrapped in pneumoboots. We placed 3 layer wraps on both lower extremities today. These will be changed weekly. I hope we can get his skin healed and his swelling under control over the next few weeks. After that, he would likely benefit from compression stockings and elevation. We are also going to be assessing his arterial system in the near future as well.

## 2016-03-27 NOTE — Progress Notes (Signed)
MRN : [id number]  Norman Morales is a 83 y.o. (25-Mar-1926) male who presents with chief complaint of  Chief Complaint  Patient presents with  . Re-evaluation    Leg leaking lymph fluid  .  History of Present Illness: Patient returns today in follow up of Worsening leg swelling. He and his wife Route of the country last week and he began developing lymphatic fluid from his left leg. The drainage has largely stopped since last night, but he still has several small scabs worse on the left leg than the right. He has swelling bilaterally which is worse than his last visit. He has not returned for his postprocedural arterial assessment after left lower extremity intervention back in November. He was scheduled to have his right leg fixed but this got delayed secondary to the flu and he has not returned for follow-up. He does not have fever or chills. He did take an antibiotic while he was out of the country and has resumed his Lasix which seems to help somewhat.       Past Medical History:  Diagnosis Date  . CHF (congestive heart failure) ([place])   . Hypertension          Past Surgical History:  Procedure Laterality Date  . CATARACT EXTRACTION, BILATERAL    . EXPLORATION POST OPERATIVE OPEN HEART    . LUMBAR FUSION    . REPLACEMENT TOTAL KNEE BILATERAL           Family History  Problem Relation Age of Onset  . Aortic aneurysm Mother   No bleeding disorders, clotting disorders, or autoimmune diseases  Social History        Social History   Substance Use Topics   . Smoking status: Former Research scientist (life sciences)   . Smokeless tobacco: Never Used   . Alcohol use Yes     Comment: occasionally   Married, lives with wife. Retired physical therapist  No Known Allergies        Current Outpatient Prescriptions  Medication Sig Dispense Refill  . albuterol (PROVENTIL HFA;VENTOLIN HFA) 108 (90 Base) MCG/ACT inhaler INHALE TWO INHALATIONS INTO THE LUNGS EVERY 6 HOURS AS NEEDED FOR  WHEEZING    . allopurinol (ZYLOPRIM) 100 MG tablet TAKE 2 TABLETS BY MOUTH EVERY DAY AS DIRECTED    . aspirin EC 81 MG tablet Take 81 mg by mouth.    Marland Kitchen atorvastatin (LIPITOR) 80 MG tablet TAKE ONE TABLET AT BEDTIME    . azelastine (ASTELIN) 0.1 % nasal spray Place into the nose.    . bumetanide (BUMEX) 2 MG tablet TAKE 1 TABLET BY MOUTH EVERY DAY.    Marland Kitchen Cyanocobalamin (RA VITAMIN B-12 TR) 1000 MCG TBCR Take by mouth.    Arne Cleveland 5 MG TABS tablet     . metoprolol tartrate (LOPRESSOR) 25 MG tablet     . spironolactone (ALDACTONE) 25 MG tablet      No current facility-administered medications for this visit.       REVIEW OF SYSTEMS (Negative unless checked)  Constitutional: [] Weight loss  [] Fever  [] Chills Cardiac: [] Chest pain   [] Chest pressure   [x] Palpitations   [x] Shortness of breath when laying flat   [] Shortness of breath at rest   [x] Shortness of breath with exertion. Vascular:  [] Pain in legs with walking   [] Pain in legs at rest   [] Pain in legs when laying flat   [] Claudication   [x] Pain in feet when walking  [] Pain in feet at rest  [] Pain  in feet when laying flat   [] History of DVT   [] Phlebitis   [x] Swelling in legs   [] Varicose veins   [] Non-healing ulcers Pulmonary:   [] Uses home oxygen   [] Productive cough   [] Hemoptysis   [] Wheeze  [] COPD   [] Asthma Neurologic:  [] Dizziness  [] Blackouts   [] Seizures   [] History of stroke   [] History of TIA  [] Aphasia   [] Temporary blindness   [] Dysphagia   [] Weakness or numbness in arms   [] Weakness or numbness in legs Musculoskeletal:  [] Arthritis   [] Joint swelling   [x] Joint pain   [] Low back pain Hematologic:  [] Easy bruising  [] Easy bleeding   [] Hypercoagulable state   [] Anemic  [] Hepatitis Gastrointestinal:  [] Blood in stool   [] Vomiting blood  [] Gastroesophageal reflux/heartburn   [] Abdominal pain Genitourinary:  [] Chronic kidney disease   [] Difficult urination  [] Frequent urination  [] Burning with urination    [] Hematuria Skin:  [] Rashes   [x] Ulcers   [x] Wounds Psychological:  [] History of anxiety   []  History of major depression.    Physical Exam BP (!) 181/94   Pulse 82   Resp 16   Ht 6' (1.829 m)   Wt 255 lb (115.7 kg)   BMI 34.58 kg/m  Gen:  WD/WN, NAD Head: [place]/AT, No temporalis wasting. Prominent temp pulse not noted. Ear/Nose/Throat: Hearing grossly intact, nares w/o erythema or drainage, oropharynx w/o Erythema/Exudate Eyes: Conjunctiva clear, sclera non-icteric  Neck: trachea midline.  No JVD.  Pulmonary:  Good air movement, no use of accessory muscles Cardiac: Irregularly irregular Vascular:  Vessel Right Left  Radial Palpable Palpable  Ulnar Palpable Palpable  Brachial Palpable Palpable  Carotid Palpable, without bruit Palpable, without bruit  Aorta Not palpable N/A  Femoral Palpable Palpable  Popliteal Not Palpable Not Palpable  PT Trace Palpable trace Palpable  DP Trace Palpable 1+ Palpable   Gastrointestinal: soft, non-tender/non-distended. No guarding/reflex. No masses, surgical incisions, or scars. Musculoskeletal: M/S 5/5 throughout.  Clean dry ulcerations as described above on both feet.  No deformity or atrophy. 1-2 right lower extremity edema and 2-3+ left lower extremity edema. Neurologic: Sensation grossly intact in extremities.  Symmetrical.  Speech is fluent. Motor exam as listed above. Psychiatric: Judgment intact, Mood & affect appropriate for pt's clinical situation. Dermatologic: Clean dry ulcerations on both feet as described above. No erythema or drainage. Several small superficial ulcerations on the left calf area. Lymph : No Cervical, Axillary, or Inguinal lymphadenopathy.    Labs No results found for this or any previous visit (from the past 2160 hour(s)).  Radiology No results found.   Assessment/Plan Arrhythmia On anticoagulation  Essential hypertension blood pressure control important in reducing the progression of  atherosclerotic disease. On appropriate oral medications.   Congestive heart failure (HCC) On diuretics but still has issues with swelling which can exacerbate his lower extremity ulcerations. Very worrisome situation. Overall perfusion could be impaired to his feet from cardiac dysfunction as well.  Atherosclerosis of native arteries of the extremities with ulceration ([place]) Needs to have his ABIs rechecked. We will do at his follow-up visits.  Swelling of limb His swelling is markedly worse today. He needs to have his legs wrapped in pneumoboots. We placed 3 layer wraps on both lower extremities today. These will be changed weekly. I hope we can get his skin healed and his swelling under control over the next few weeks. After that, he would likely benefit from compression stockings and elevation. We are also going to be assessing his arterial system  in the near future as well.    Leotis Pain, MD  03/27/2016 4:37 PM    This note was created with Dragon medical transcription system.  Any errors from dictation are purely unintentional

## 2016-04-03 ENCOUNTER — Encounter (INDEPENDENT_AMBULATORY_CARE_PROVIDER_SITE_OTHER): Payer: Medicare Other

## 2016-07-17 ENCOUNTER — Encounter: Payer: Self-pay | Admitting: Medical Oncology

## 2016-07-17 ENCOUNTER — Emergency Department
Admission: EM | Admit: 2016-07-17 | Discharge: 2016-07-17 | Disposition: A | Discharge status: Home / Self Care | Payer: Medicare Other | Attending: Emergency Medicine | Admitting: Emergency Medicine

## 2016-07-17 DIAGNOSIS — I509 Heart failure, unspecified: Secondary | ICD-10-CM | POA: Insufficient documentation

## 2016-07-17 DIAGNOSIS — Z87891 Personal history of nicotine dependence: Secondary | ICD-10-CM | POA: Diagnosis not present

## 2016-07-17 DIAGNOSIS — I2510 Atherosclerotic heart disease of native coronary artery without angina pectoris: Secondary | ICD-10-CM | POA: Insufficient documentation

## 2016-07-17 DIAGNOSIS — R040 Epistaxis: Secondary | ICD-10-CM | POA: Insufficient documentation

## 2016-07-17 DIAGNOSIS — I110 Hypertensive heart disease with heart failure: Secondary | ICD-10-CM | POA: Diagnosis not present

## 2016-07-17 DIAGNOSIS — Z96653 Presence of artificial knee joint, bilateral: Secondary | ICD-10-CM | POA: Insufficient documentation

## 2016-07-17 MED ORDER — OXYMETAZOLINE HCL 0.05 % NA SOLN
2.0000 | Freq: Once | NASAL | Status: AC
Start: 2016-07-17 — End: 2016-07-17
  Administered 2016-07-17: 2 via NASAL

## 2016-07-17 MED ORDER — TRANEXAMIC ACID 1000 MG/10ML IV SOLN
500.0000 mg | Freq: Once | INTRAVENOUS | Status: AC
  Administered 2016-07-17: 500 mg via TOPICAL
  Filled 2016-07-17: qty 10

## 2016-07-17 MED ORDER — OXYMETAZOLINE HCL 0.05 % NA SOLN
1.0000 | Freq: Once | NASAL | Status: AC
  Administered 2016-07-17: 1 via NASAL
  Filled 2016-07-17: qty 15

## 2016-07-17 MED ORDER — METOPROLOL TARTRATE 50 MG PO TABS
50.0000 mg | ORAL_TABLET | Freq: Once | ORAL | Status: AC
  Administered 2016-07-17: 50 mg via ORAL
  Filled 2016-07-17: qty 1

## 2016-07-17 MED ORDER — AMOXICILLIN 500 MG PO CAPS: 500.0000 mg | ORAL_CAPSULE | Freq: Three times a day (TID) | ORAL | 0 refills | Status: DC

## 2016-07-17 MED ORDER — OXYCODONE-ACETAMINOPHEN 5-325 MG PO TABS: 1.0000 | ORAL_TABLET | Freq: Four times a day (QID) | ORAL | 0 refills | Status: DC | PRN

## 2016-07-17 MED ORDER — SPIRONOLACTONE 25 MG PO TABS
25.0000 mg | ORAL_TABLET | Freq: Every day | ORAL | Status: DC
  Administered 2016-07-17: 25 mg via ORAL
  Filled 2016-07-17: qty 1

## 2016-07-17 MED ORDER — ACETAMINOPHEN 500 MG PO TABS
1000.0000 mg | ORAL_TABLET | Freq: Once | ORAL | Status: AC
  Administered 2016-07-17: 1000 mg via ORAL
  Filled 2016-07-17: qty 2

## 2016-07-17 NOTE — ED Notes (Signed)
Pt vomiting blood

## 2016-07-17 NOTE — ED Provider Notes (Signed)
[name] Emergency Department Provider Note       Time seen: ----------------------------------------- 9:18 AM on 07/17/2016 -----------------------------------------     I have reviewed the triage vital signs and the nursing notes.   HISTORY   Chief Complaint Epistaxis    HPI Norman Morales is a 83 y.o. male who presents to the ED for bilateral epistaxis that started about 30 mins prior to arrival. Patient denies any known cause. Patient states he does have a history of this because he is on Eliquis but typically just one side bleeds at a time and it stops with pressure. This time pressure has not improved it. He denies any other bleeding or other complaints.   Past Medical History:  Diagnosis Date  . CHF (congestive heart failure) ([place])   . Coronary artery disease   . Hyperlipidemia   . Hypertension     Patient Active Problem List   Diagnosis Date Noted  . Swelling of limb 03/27/2016  . Arrhythmia 12/24/2015  . Essential hypertension 12/24/2015  . Congestive heart failure ([place]) 12/24/2015  . Atherosclerosis of native arteries of the extremities with ulceration ([place]) 12/24/2015  . CAD (coronary artery disease) of artery bypass graft 12/24/2015    Past Surgical History:  Procedure Laterality Date  . CATARACT EXTRACTION, BILATERAL    . EXPLORATION POST OPERATIVE OPEN HEART    . LUMBAR FUSION    . PERIPHERAL VASCULAR CATHETERIZATION Left 12/30/2015   Procedure: Lower Extremity Angiography;  Surgeon: Algernon Huxley, MD;  Location: [facility];  Service: Cardiovascular;  Laterality: Left;  . PERIPHERAL VASCULAR CATHETERIZATION  12/30/2015   Procedure: Lower Extremity Intervention;  Surgeon: Algernon Huxley, MD;  Location: [facility];  Service: Cardiovascular;;  . REPLACEMENT TOTAL KNEE BILATERAL      Allergies Patient has no known allergies.  Social History Social History  Substance Use Topics  . Smoking status: Former  Smoker    Packs/day: 1.00    Years: 42.00    Types: Cigarettes  . Smokeless tobacco: Never Used     Comment: quit 42 years ago  . Alcohol use Yes     Comment: occasionally    Review of Systems Constitutional: Negative for fever. Eyes: Negative for vision changes ENT: Positive for bilateral epistaxis Cardiovascular: Negative for chest pain. Respiratory: Negative for shortness of breath. Gastrointestinal: Negative for abdominal pain, vomiting and diarrhea. Musculoskeletal: Negative for back pain. Skin: Negative for rash. Neurological: Negative for headaches, focal weakness or numbness.  All systems negative/normal/unremarkable except as stated in the HPI  ____________________________________________   PHYSICAL EXAM:  VITAL SIGNS: ED Triage Vitals  Enc Vitals Group     BP 07/17/16 0901 (!) 169/95     Pulse Rate 07/17/16 0901 86     Resp 07/17/16 0901 20     Temp 07/17/16 0901 97.8 F (36.6 C)     Temp Source 07/17/16 0901 Axillary     SpO2 07/17/16 0901 92 %     Weight 07/17/16 0859 235 lb (106.6 kg)     Height 07/17/16 0859 6' (1.829 m)     Head Circumference --      Peak Flow --      Pain Score --      Pain Loc --      Pain Edu? --      Excl. in [place]? --     Constitutional: Alert and oriented. Well appearing and in no distress. Eyes: Conjunctivae are normal. Normal extraocular  movements. ENT   Head: Normocephalic and atraumatic.   Nose: Unilateral anterior left-sided epistaxis is noted. No posterior bleeding   Mouth/Throat: Mucous membranes are moist.   Neck: No stridor. Cardiovascular: Normal rate, regular rhythm. No murmurs, rubs, or gallops. Respiratory: Normal respiratory effort without tachypnea nor retractions. Breath sounds are clear and equal bilaterally. No wheezes/rales/rhonchi. Musculoskeletal: Nontender with normal range of motion in extremities. No lower extremity tenderness nor edema. Neurologic:  Normal speech and language. No gross  focal neurologic deficits are appreciated.  Skin:  Skin is warm, dry and intact. No rash noted. Psychiatric: Mood and affect are normal. Speech and behavior are normal.  ___________________________________________  ED COURSE:  Pertinent labs & imaging results that were available during my care of the patient were reviewed by me and considered in my medical decision making (see chart for details). Patient presents for epistaxis, we will apply Afrin and nasal pressure. He would likely benefit from tranexamic acid   .Epistaxis Management Date/Time: 07/17/2016 9:39 AM Performed by: Earleen Newport Authorized by: Lenise Arena E   Consent:    Consent obtained:  Verbal   Consent given by:  Patient Procedure details:    Treatment site:  L anterior   Treatment method:  Anterior pack   Treatment complexity:  Limited   Treatment episode: initial   Post-procedure details:    Assessment:  Bleeding stopped   Patient tolerance of procedure:  Tolerated well, no immediate complications Comments:     TXA soaked cotton balls were applied and left in place  Bleeding subsequently reoccurred, calm balls were removed and rapid Rhino was placed    ____________________________________________  FINAL ASSESSMENT AND PLAN  Epistaxis  Plan: Patient's labs and imaging were dictated above. Patient had presented for epistaxis which initially did not improve until epistaxis balloon was placed. He was then observed for a long period time without any further bleeding. He is encouraged to hold his Eliquis the next 48 hours. He will have close outpatient follow-up with ENT.   Earleen Newport, MD   Note: This note was generated in part or whole with voice recognition software. Voice recognition is usually quite accurate but there are transcription errors that can and very often do occur. I apologize for any typographical errors that were not detected and corrected.     Earleen Newport, MD 07/17/16 [phone]

## 2016-07-17 NOTE — Discharge Instructions (Signed)
Hold Eliquis for 48 hours, return for rebleeding

## 2016-07-17 NOTE — ED Triage Notes (Signed)
Pt reports he began having bilt nare nose bleed about 30 min pta. Pt denies any known cause. Pt denies pain. Pt takes eliquis.

## 2016-09-04 ENCOUNTER — Ambulatory Visit (INDEPENDENT_AMBULATORY_CARE_PROVIDER_SITE_OTHER): Payer: Medicare Other | Admitting: Vascular Surgery

## 2016-09-04 ENCOUNTER — Encounter (INDEPENDENT_AMBULATORY_CARE_PROVIDER_SITE_OTHER): Payer: Self-pay | Admitting: Vascular Surgery

## 2016-09-04 VITALS — BP 159/80 | HR 79 | Resp 16 | Wt 257.0 lb

## 2016-09-04 DIAGNOSIS — I739 Peripheral vascular disease, unspecified: Secondary | ICD-10-CM | POA: Insufficient documentation

## 2016-09-04 DIAGNOSIS — I10 Essential (primary) hypertension: Secondary | ICD-10-CM | POA: Diagnosis not present

## 2016-09-04 DIAGNOSIS — I7025 Atherosclerosis of native arteries of other extremities with ulceration: Secondary | ICD-10-CM | POA: Diagnosis not present

## 2016-09-04 DIAGNOSIS — I509 Heart failure, unspecified: Secondary | ICD-10-CM

## 2016-09-04 DIAGNOSIS — E785 Hyperlipidemia, unspecified: Secondary | ICD-10-CM | POA: Insufficient documentation

## 2016-09-04 NOTE — Assessment & Plan Note (Signed)
lipid control important in reducing the progression of atherosclerotic disease. Continue statin therapy  

## 2016-09-04 NOTE — Patient Instructions (Signed)

## 2016-09-04 NOTE — Assessment & Plan Note (Signed)
Can certainly contribute to the poor perfusion to the feet

## 2016-09-04 NOTE — Assessment & Plan Note (Signed)
blood pressure control important in reducing the progression of atherosclerotic disease. On appropriate oral medications.  

## 2016-09-04 NOTE — Assessment & Plan Note (Signed)
The patient has a known history of peripheral arterial disease which is significant. A lot of what he describes sounds neuropathic, we're going to try some Neurontin 300 mg twice a day for several weeks to see if that gives him any improvement. We will have him come back and get some repeat noninvasive studies to reassess his perfusion as well. I'll see him back in a month or 2.

## 2016-09-04 NOTE — Progress Notes (Signed)
MRN : [id number]  Norman Morales is a 83 y.o. (Dec 19, 1926) male who presents with chief complaint of  Chief Complaint  Patient presents with  . Re-evaluation    circulation problems  .  History of Present Illness: Patient returns today in follow up of PAD. He continues to complain of a burning pain in the toes on both feet, worse on the right than the left. He is undergone revascularization of the left lower extremity almost a year ago now. This helped his foot and lower leg pain and little but certainly did not resolve it. He had some small ulcerations on his feet which have healed. He denies any new ulcerations. He denies fever or chills. He had some leg swelling after travel about 6 months ago, but really not much leg swelling recently.  Current Outpatient Prescriptions  Medication Sig Dispense Refill  . acetaminophen (TYLENOL) 325 MG tablet Take 650 mg by mouth daily as needed for mild pain or headache.    . albuterol (PROVENTIL HFA;VENTOLIN HFA) 108 (90 Base) MCG/ACT inhaler Inhale into the lungs every 6 (six) hours as needed for wheezing or shortness of breath.    . allopurinol (ZYLOPRIM) 100 MG tablet TAKE 2 TABLETS BY MOUTH EVERY DAY AS DIRECTED    . aspirin EC 81 MG tablet Take 81 mg by mouth.    Marland Kitchen atorvastatin (LIPITOR) 80 MG tablet TAKE ONE TABLET AT BEDTIME    . azelastine (ASTELIN) 0.1 % nasal spray Place 1-2 sprays into the nose 2 (two) times daily as needed for rhinitis.     . bumetanide (BUMEX) 2 MG tablet Take 42ms daily in the morning    . Cyanocobalamin (B-12) 1000 MCG/ML KIT Inject 1,000 mcg as directed every 30 (thirty) days.    .Marland KitchenELIQUIS 5 MG TABS tablet Take 5 mg by mouth 2 (two) times daily.     . Melatonin 1 MG TABS Take 1 mg by mouth daily as needed (sleep).    . metolazone (ZAROXOLYN) 2.5 MG tablet Take 2.5 mg by mouth every other day.    . metoprolol tartrate (LOPRESSOR) 25 MG tablet Take 75 mg by mouth 2 (two) times daily.     .Marland Kitchenneomycin-bacitracin-polymyxin  (NEOSPORIN) ointment Apply 1 application topically as needed for wound care. apply to eye    . spironolactone (ALDACTONE) 25 MG tablet Take 25 mg by mouth daily.     . Acetylcysteine 600 MG CAPS Take 1 capsule by mouth daily.    .Marland Kitchenamoxicillin (AMOXIL) 500 MG capsule Take 1 capsule (500 mg total) by mouth 3 (three) times daily. (Patient not taking: Reported on 09/04/2016) 21 capsule 0  . Coenzyme Q10 (COQ-10) 200 MG CAPS Take 200 mg by mouth every evening.    .Marland Kitchenoseltamivir (TAMIFLU) 75 MG capsule TAKE ONE CAPSULE BY MOUTH EVERY DAY FOR 10 DAYS  0  . oxyCODONE-acetaminophen (PERCOCET) 5-325 MG tablet Take 1-2 tablets by mouth every 6 (six) hours as needed. (Patient not taking: Reported on 09/04/2016) 20 tablet 0  . telmisartan (MICARDIS) 80 MG tablet Take 80 mg by mouth daily.    .Marland Kitchentopiramate (TOPAMAX) 50 MG tablet Take by mouth.    . traMADol (ULTRAM) 50 MG tablet     . umeclidinium-vilanterol (ANORO ELLIPTA) 62.5-25 MCG/INH AEPB Inhale 1 puff into the lungs 2 (two) times daily.     No current facility-administered medications for this visit.     Past Medical History:  Diagnosis Date  . CHF (congestive heart  failure) ([place])   . Coronary artery disease   . Hyperlipidemia   . Hypertension     Past Surgical History:  Procedure Laterality Date  . CATARACT EXTRACTION, BILATERAL    . EXPLORATION POST OPERATIVE OPEN HEART    . LUMBAR FUSION    . PERIPHERAL VASCULAR CATHETERIZATION Left 12/30/2015   Procedure: Lower Extremity Angiography;  Surgeon: Algernon Huxley, MD;  Location: [facility];  Service: Cardiovascular;  Laterality: Left;  . PERIPHERAL VASCULAR CATHETERIZATION  12/30/2015   Procedure: Lower Extremity Intervention;  Surgeon: Algernon Huxley, MD;  Location: [facility];  Service: Cardiovascular;;  . REPLACEMENT TOTAL KNEE BILATERAL      Social History Social History  Substance Use Topics  . Smoking status: Former Smoker    Packs/day: 1.00    Years: 42.00    Types:  Cigarettes  . Smokeless tobacco: Never Used     Comment: quit 42 years ago  . Alcohol use Yes     Comment: occasionally    Family History Family History  Problem Relation Age of Onset  . Aortic aneurysm Mother     No Known Allergies   REVIEW OF SYSTEMS (Negative unless checked)  Constitutional: _0 Weight loss  _1 Fever  _2 Chills Cardiac: _3 Chest pain   _4 Chest pressure   _5 Palpitations   _6 Shortness of breath when laying flat   _7 Shortness of breath at rest   _8 Shortness of breath with exertion. Vascular:  _9 Pain in legs with walking   _10 Pain in legs at rest   _11 Pain in legs when laying flat   _12 Claudication   _13 Pain in feet when walking  _14 Pain in feet at rest  _15 Pain in feet when laying flat   _16 History of DVT   _17 Phlebitis   _18 Swelling in legs   _19 Varicose veins   _20 Non-healing ulcers Pulmonary:   _21 Uses home oxygen   _22 Productive cough   _23 Hemoptysis   _24 Wheeze  _25 COPD   _26 Asthma Neurologic:  _27 Dizziness  _28 Blackouts   _29 Seizures   _30 History of stroke   _31 History of TIA  _32 Aphasia   _33 Temporary blindness   _34 Dysphagia   _35 Weakness or numbness in arms   _36 Weakness or numbness in legs Musculoskeletal:  _37 Arthritis   _38 Joint swelling   _39 Joint pain   _40 Low back pain Hematologic:  _41 Easy bruising  _42 Easy bleeding   _43 Hypercoagulable state   _44 Anemic   Gastrointestinal:  _45 Blood in stool   _46 Vomiting blood  _47 Gastroesophageal reflux/heartburn   _48 Abdominal pain Genitourinary:  _49 Chronic kidney disease   _50 Difficult urination  _51 Frequent urination  _52 Burning with urination   _53 Hematuria Skin:  _54 Rashes   _55 Ulcers   _56 Wounds Psychological:  _57 History of anxiety   _58  History of major depression.  Physical Examination  BP (!) 159/80   Pulse 79   Resp 16   Wt 257 lb (116.6 kg)   BMI 34.86 kg/m  Gen:  WD/WN, NAD Head: [place]/AT, No temporalis wasting. Ear/Nose/Throat: Hearing grossly intact, nares w/o erythema or drainage, trachea midline Eyes: Conjunctiva clear. Sclera  non-icteric Neck: Supple.  No JVD.  Pulmonary:  Good air movement, no use of accessory muscles.  Cardiac: Irregular Vascular:  Vessel Right Left  Radial Palpable Palpable                          PT Not Palpable Not Palpable  DP Trace Palpable 1+ Palpable    Musculoskeletal: M/S 5/5 throughout.  No deformity or atrophy. Moderate stasis changes and mild bilateral  edema. Neurologic: Sensation grossly intact in extremities.  Symmetrical.  Speech is fluent.  Psychiatric: Judgment intact, Mood & affect appropriate for pt's clinical situation. Dermatologic: No rashes or ulcers noted.  No cellulitis or open wounds.       Labs No results found for this or any previous visit (from the past 2160 hour(s)).  Radiology No results found.   Assessment/Plan  Congestive heart failure (HCC) Can certainly contribute to the poor perfusion to the feet  Essential hypertension blood pressure control important in reducing the progression of atherosclerotic disease. On appropriate oral medications.   Hyperlipidemia lipid control important in reducing the progression of atherosclerotic disease. Continue statin therapy   PAD (peripheral artery disease) ([place]) The patient has a known history of peripheral arterial disease which is significant. A lot of what he describes sounds neuropathic, we're going to try some Neurontin 300 mg twice a day for several weeks to see if that gives him any improvement. We will have him come back and get some repeat noninvasive studies to reassess his perfusion as well. I'll see him back in a month or 2.    Leotis Pain, MD  09/04/2016 1:48 PM    This note was created with Dragon medical transcription system.  Any errors from dictation are purely unintentional

## 2016-11-10 ENCOUNTER — Encounter (INDEPENDENT_AMBULATORY_CARE_PROVIDER_SITE_OTHER): Payer: Medicare Other

## 2016-11-10 ENCOUNTER — Ambulatory Visit (INDEPENDENT_AMBULATORY_CARE_PROVIDER_SITE_OTHER): Payer: Medicare Other | Admitting: Vascular Surgery

## 2016-12-23 ENCOUNTER — Other Ambulatory Visit: Payer: Self-pay

## 2016-12-23 ENCOUNTER — Encounter: Payer: Self-pay | Admitting: Emergency Medicine

## 2016-12-23 ENCOUNTER — Emergency Department: Payer: Medicare Other

## 2016-12-23 ENCOUNTER — Inpatient Hospital Stay
Admission: EM | Admit: 2016-12-23 | Discharge: 2016-12-27 | DRG: 871 | Disposition: A | Discharge status: Home Health | Payer: Medicare Other | Attending: Internal Medicine | Admitting: Internal Medicine

## 2016-12-23 DIAGNOSIS — Z79891 Long term (current) use of opiate analgesic: Secondary | ICD-10-CM | POA: Diagnosis not present

## 2016-12-23 DIAGNOSIS — I959 Hypotension, unspecified: Secondary | ICD-10-CM | POA: Diagnosis present

## 2016-12-23 DIAGNOSIS — I4891 Unspecified atrial fibrillation: Secondary | ICD-10-CM | POA: Diagnosis present

## 2016-12-23 DIAGNOSIS — Z9842 Cataract extraction status, left eye: Secondary | ICD-10-CM | POA: Diagnosis not present

## 2016-12-23 DIAGNOSIS — Z79899 Other long term (current) drug therapy: Secondary | ICD-10-CM

## 2016-12-23 DIAGNOSIS — I25810 Atherosclerosis of coronary artery bypass graft(s) without angina pectoris: Secondary | ICD-10-CM | POA: Diagnosis present

## 2016-12-23 DIAGNOSIS — Z981 Arthrodesis status: Secondary | ICD-10-CM

## 2016-12-23 DIAGNOSIS — E785 Hyperlipidemia, unspecified: Secondary | ICD-10-CM | POA: Diagnosis present

## 2016-12-23 DIAGNOSIS — I739 Peripheral vascular disease, unspecified: Secondary | ICD-10-CM | POA: Diagnosis present

## 2016-12-23 DIAGNOSIS — Z87891 Personal history of nicotine dependence: Secondary | ICD-10-CM | POA: Diagnosis not present

## 2016-12-23 DIAGNOSIS — Z7982 Long term (current) use of aspirin: Secondary | ICD-10-CM

## 2016-12-23 DIAGNOSIS — Z7901 Long term (current) use of anticoagulants: Secondary | ICD-10-CM

## 2016-12-23 DIAGNOSIS — J189 Pneumonia, unspecified organism: Secondary | ICD-10-CM

## 2016-12-23 DIAGNOSIS — I10 Essential (primary) hypertension: Secondary | ICD-10-CM | POA: Diagnosis present

## 2016-12-23 DIAGNOSIS — Z9841 Cataract extraction status, right eye: Secondary | ICD-10-CM | POA: Diagnosis not present

## 2016-12-23 DIAGNOSIS — A419 Sepsis, unspecified organism: Principal | ICD-10-CM | POA: Diagnosis present

## 2016-12-23 DIAGNOSIS — I5032 Chronic diastolic (congestive) heart failure: Secondary | ICD-10-CM | POA: Diagnosis present

## 2016-12-23 DIAGNOSIS — J9601 Acute respiratory failure with hypoxia: Secondary | ICD-10-CM | POA: Diagnosis present

## 2016-12-23 DIAGNOSIS — I509 Heart failure, unspecified: Secondary | ICD-10-CM

## 2016-12-23 DIAGNOSIS — J181 Lobar pneumonia, unspecified organism: Secondary | ICD-10-CM

## 2016-12-23 DIAGNOSIS — I110 Hypertensive heart disease with heart failure: Secondary | ICD-10-CM | POA: Diagnosis present

## 2016-12-23 LAB — COMPREHENSIVE METABOLIC PANEL
ALK PHOS: 92 U/L (ref 38–126)
ALT: 15 U/L — AB (ref 17–63)
AST: 25 U/L (ref 15–41)
Albumin: 3.9 g/dL (ref 3.5–5.0)
Anion gap: 11 (ref 5–15)
BUN: 37 mg/dL — AB (ref 6–20)
CALCIUM: 9.3 mg/dL (ref 8.9–10.3)
CHLORIDE: 103 mmol/L (ref 101–111)
CO2: 24 mmol/L (ref 22–32)
CREATININE: 1 mg/dL (ref 0.61–1.24)
Glucose, Bld: 144 mg/dL — ABNORMAL HIGH (ref 65–99)
Potassium: 4.7 mmol/L (ref 3.5–5.1)
Sodium: 138 mmol/L (ref 135–145)
Total Bilirubin: 1.2 mg/dL (ref 0.3–1.2)
Total Protein: 7.6 g/dL (ref 6.5–8.1)

## 2016-12-23 LAB — CBC WITH DIFFERENTIAL/PLATELET
BASOS PCT: 0 %
Basophils Absolute: 0 10*3/uL (ref 0–0.1)
Eosinophils Absolute: 0 10*3/uL (ref 0–0.7)
Eosinophils Relative: 0 %
HEMATOCRIT: 49.2 % (ref 40.0–52.0)
HEMOGLOBIN: 16.2 g/dL (ref 13.0–18.0)
LYMPHS ABS: 1 10*3/uL (ref 1.0–3.6)
LYMPHS PCT: 6 %
MCH: 34.1 pg — AB (ref 26.0–34.0)
MCHC: 32.9 g/dL (ref 32.0–36.0)
MCV: 103.7 fL — AB (ref 80.0–100.0)
MONO ABS: 0.6 10*3/uL (ref 0.2–1.0)
MONOS PCT: 4 %
NEUTROS ABS: 13.9 10*3/uL — AB (ref 1.4–6.5)
NEUTROS PCT: 90 %
Platelets: 171 10*3/uL (ref 150–440)
RBC: 4.74 MIL/uL (ref 4.40–5.90)
RDW: 17.6 % — AB (ref 11.5–14.5)
WBC: 15.6 10*3/uL — ABNORMAL HIGH (ref 3.8–10.6)

## 2016-12-23 LAB — BRAIN NATRIURETIC PEPTIDE: B NATRIURETIC PEPTIDE 5: 226 pg/mL — AB (ref 0.0–100.0)

## 2016-12-23 LAB — LACTIC ACID, PLASMA
LACTIC ACID, VENOUS: 2.6 mmol/L — AB (ref 0.5–1.9)
Lactic Acid, Venous: 2.7 mmol/L (ref 0.5–1.9)

## 2016-12-23 LAB — TROPONIN I: Troponin I: <0.03 ng/mL (ref <0.03)

## 2016-12-23 LAB — MAGNESIUM: MAGNESIUM: 2.1 mg/dL (ref 1.7–2.4)

## 2016-12-23 MED ORDER — METOPROLOL TARTRATE 50 MG PO TABS
75.0000 mg | ORAL_TABLET | Freq: Two times a day (BID) | ORAL | Status: DC
  Filled 2016-12-23: qty 1

## 2016-12-23 MED ORDER — GABAPENTIN 600 MG PO TABS
300.0000 mg | ORAL_TABLET | Freq: Two times a day (BID) | ORAL | Status: DC
  Administered 2016-12-23 – 2016-12-27 (×8): 300 mg via ORAL
  Filled 2016-12-23 (×8): qty 1

## 2016-12-23 MED ORDER — ZOLPIDEM TARTRATE 5 MG PO TABS
5.0000 mg | ORAL_TABLET | Freq: Every evening | ORAL | Status: DC | PRN
  Administered 2016-12-24 – 2016-12-27 (×4): 5 mg via ORAL
  Filled 2016-12-23 (×5): qty 1

## 2016-12-23 MED ORDER — ACETAMINOPHEN 325 MG PO TABS: 650.0000 mg | ORAL_TABLET | Freq: Four times a day (QID) | ORAL | Status: DC | PRN

## 2016-12-23 MED ORDER — CEFTRIAXONE SODIUM IN DEXTROSE 20 MG/ML IV SOLN
1.0000 g | INTRAVENOUS | Status: DC
  Filled 2016-12-23: qty 50

## 2016-12-23 MED ORDER — ATORVASTATIN CALCIUM 20 MG PO TABS
80.0000 mg | ORAL_TABLET | Freq: Every day | ORAL | Status: DC
  Administered 2016-12-23 – 2016-12-26 (×4): 80 mg via ORAL
  Filled 2016-12-23 (×4): qty 4

## 2016-12-23 MED ORDER — LABETALOL HCL 5 MG/ML IV SOLN: 10.0000 mg | INTRAVENOUS | Status: DC | PRN

## 2016-12-23 MED ORDER — SPIRONOLACTONE 25 MG PO TABS
25.0000 mg | ORAL_TABLET | Freq: Every day | ORAL | Status: DC
  Filled 2016-12-23: qty 1

## 2016-12-23 MED ORDER — BUMETANIDE 1 MG PO TABS
2.0000 mg | ORAL_TABLET | Freq: Once | ORAL | Status: AC
  Administered 2016-12-23: 2 mg via ORAL
  Filled 2016-12-23: qty 1

## 2016-12-23 MED ORDER — DEXTROSE 5 % IV SOLN
INTRAVENOUS | Status: DC
  Administered 2016-12-24 – 2016-12-26 (×3): via INTRAVENOUS
  Filled 2016-12-23 (×4): qty 10

## 2016-12-23 MED ORDER — PREDNISONE 50 MG PO TABS
50.0000 mg | ORAL_TABLET | Freq: Every day | ORAL | Status: DC
  Administered 2016-12-23 – 2016-12-27 (×5): 50 mg via ORAL
  Filled 2016-12-23 (×5): qty 1

## 2016-12-23 MED ORDER — ASPIRIN EC 81 MG PO TBEC
81.0000 mg | DELAYED_RELEASE_TABLET | Freq: Every day | ORAL | Status: DC
  Administered 2016-12-23: 81 mg via ORAL
  Filled 2016-12-23 (×2): qty 1

## 2016-12-23 MED ORDER — APIXABAN 5 MG PO TABS
5.0000 mg | ORAL_TABLET | Freq: Two times a day (BID) | ORAL | Status: DC
  Administered 2016-12-23 – 2016-12-27 (×9): 5 mg via ORAL
  Filled 2016-12-23 (×9): qty 1

## 2016-12-23 MED ORDER — IPRATROPIUM-ALBUTEROL 0.5-2.5 (3) MG/3ML IN SOLN: 3.0000 mL | RESPIRATORY_TRACT | Status: DC | PRN

## 2016-12-23 MED ORDER — ONDANSETRON HCL 4 MG PO TABS: 4.0000 mg | ORAL_TABLET | Freq: Four times a day (QID) | ORAL | Status: DC | PRN

## 2016-12-23 MED ORDER — GUAIFENESIN-DM 100-10 MG/5ML PO SYRP
5.0000 mL | ORAL_SOLUTION | ORAL | Status: DC | PRN
  Administered 2016-12-23 – 2016-12-27 (×3): 5 mL via ORAL
  Filled 2016-12-23 (×3): qty 5

## 2016-12-23 MED ORDER — METOPROLOL TARTRATE 5 MG/5ML IV SOLN: 5.0000 mg | INTRAVENOUS | Status: DC | PRN

## 2016-12-23 MED ORDER — ALLOPURINOL 100 MG PO TABS
200.0000 mg | ORAL_TABLET | Freq: Every day | ORAL | Status: DC
  Administered 2016-12-23 – 2016-12-27 (×5): 200 mg via ORAL
  Filled 2016-12-23 (×5): qty 2

## 2016-12-23 MED ORDER — CEFTRIAXONE SODIUM IN DEXTROSE 20 MG/ML IV SOLN
1.0000 g | Freq: Once | INTRAVENOUS | Status: AC
  Administered 2016-12-23: 1 g via INTRAVENOUS
  Filled 2016-12-23: qty 50

## 2016-12-23 MED ORDER — IRBESARTAN 150 MG PO TABS
300.0000 mg | ORAL_TABLET | Freq: Every day | ORAL | Status: DC
  Filled 2016-12-23: qty 2

## 2016-12-23 MED ORDER — ALBUTEROL SULFATE (2.5 MG/3ML) 0.083% IN NEBU: 2.5000 mg | INHALATION_SOLUTION | RESPIRATORY_TRACT | Status: DC | PRN

## 2016-12-23 MED ORDER — IPRATROPIUM-ALBUTEROL 0.5-2.5 (3) MG/3ML IN SOLN
3.0000 mL | Freq: Four times a day (QID) | RESPIRATORY_TRACT | Status: DC
  Administered 2016-12-23 – 2016-12-27 (×13): 3 mL via RESPIRATORY_TRACT
  Filled 2016-12-23 (×13): qty 3

## 2016-12-23 MED ORDER — BUMETANIDE 2 MG PO TABS
2.0000 mg | ORAL_TABLET | Freq: Every day | ORAL | Status: DC
Start: 2016-12-23 — End: 2016-12-27
  Administered 2016-12-24 – 2016-12-27 (×4): 2 mg via ORAL
  Filled 2016-12-23 (×5): qty 1

## 2016-12-23 MED ORDER — METOPROLOL TARTRATE 25 MG PO TABS: 12.5000 mg | ORAL_TABLET | Freq: Two times a day (BID) | ORAL | Status: DC

## 2016-12-23 MED ORDER — ACETAMINOPHEN 650 MG RE SUPP: 650.0000 mg | Freq: Four times a day (QID) | RECTAL | Status: DC | PRN

## 2016-12-23 MED ORDER — UMECLIDINIUM-VILANTEROL 62.5-25 MCG/INH IN AEPB
1.0000 | INHALATION_SPRAY | Freq: Every day | RESPIRATORY_TRACT | Status: DC
  Administered 2016-12-23 – 2016-12-27 (×5): 1 via RESPIRATORY_TRACT
  Filled 2016-12-23: qty 14

## 2016-12-23 MED ORDER — DEXTROSE 5 % IV SOLN
500.0000 mg | Freq: Once | INTRAVENOUS | Status: AC
  Administered 2016-12-23: 500 mg via INTRAVENOUS
  Filled 2016-12-23: qty 500

## 2016-12-23 MED ORDER — ONDANSETRON HCL 4 MG/2ML IJ SOLN: 4.0000 mg | Freq: Four times a day (QID) | INTRAMUSCULAR | Status: DC | PRN

## 2016-12-23 MED ORDER — DEXTROSE 5 % IV SOLN
500.0000 mg | INTRAVENOUS | Status: DC
  Administered 2016-12-24 – 2016-12-27 (×4): 500 mg via INTRAVENOUS
  Filled 2016-12-23 (×4): qty 500

## 2016-12-23 NOTE — ED Triage Notes (Signed)
Patient coming from home via EMS for shortness of breath x last couple hours. Patient 02 sat was 70-80% on RA. Patient placed on non-rebreather and up to 97%. Patient vitals for EMS was 201/112 and hx of a-fib and patient is in afib.

## 2016-12-23 NOTE — ED Notes (Signed)
Floor refused to take patient due to being on bipap. Report attempted to be called. Katie in ED given report

## 2016-12-23 NOTE — H&P (Signed)
[facility] [place] NAME: Norman Morales    MR#:  [id number]  DATE OF BIRTH:  1926-02-25  DATE OF ADMISSION:  12/23/2016  PRIMARY CARE PHYSICIAN: Adin Hector, MD   REQUESTING/REFERRING PHYSICIAN: Karma Greaser, MD  CHIEF COMPLAINT:   Chief Complaint  Patient presents with  . Shortness of Breath    HISTORY OF PRESENT ILLNESS:  Norman Morales  is a 83 y.o. male who presents with cough and productive sputum for around a week, but with acute onset shortness of breath that began last night.  He called EMS to bring him to the hospital and when they arrived his oxygen sats on room air were 70s-80s.  His sats improved significantly on nonrebreather, but he had some work of breathing when he arrived here to the ED was placed on BiPAP for some time.  Imaging showed pneumonia.  Hospitalist were called for admission  PAST MEDICAL HISTORY:   Past Medical History:  Diagnosis Date  . CHF (congestive heart failure) ([place])   . Coronary artery disease   . Hyperlipidemia   . Hypertension     PAST SURGICAL HISTORY:   Past Surgical History:  Procedure Laterality Date  . CARDIAC SURGERY    . CATARACT EXTRACTION, BILATERAL    . EXPLORATION POST OPERATIVE OPEN HEART    . LUMBAR FUSION    . PERIPHERAL VASCULAR CATHETERIZATION Left 12/30/2015   Procedure: Lower Extremity Angiography;  Surgeon: Algernon Huxley, MD;  Location: [facility];  Service: Cardiovascular;  Laterality: Left;  . PERIPHERAL VASCULAR CATHETERIZATION  12/30/2015   Procedure: Lower Extremity Intervention;  Surgeon: Algernon Huxley, MD;  Location: [facility];  Service: Cardiovascular;;  . REPLACEMENT TOTAL KNEE BILATERAL      SOCIAL HISTORY:   Social History   Tobacco Use  . Smoking status: Former Smoker    Packs/day: 1.00    Years: 42.00    Pack years: 42.00    Types: Cigarettes  . Smokeless tobacco: Never Used  . Tobacco comment: quit 42 years ago   Substance Use Topics  . Alcohol use: Yes    Comment: occasionally    FAMILY HISTORY:   Family History  Problem Relation Age of Onset  . Aortic aneurysm Mother     DRUG ALLERGIES:  No Known Allergies  MEDICATIONS AT HOME:   Prior to Admission medications   Medication Sig Start Date End Date Taking? Authorizing Provider  acetaminophen (TYLENOL) 325 MG tablet Take 650 mg by mouth daily as needed for mild pain or headache.    [provider]  Acetylcysteine 600 MG CAPS Take 1 capsule by mouth daily.    [provider]  albuterol (PROVENTIL HFA;VENTOLIN HFA) 108 (90 Base) MCG/ACT inhaler Inhale into the lungs every 6 (six) hours as needed for wheezing or shortness of breath.    [provider]  allopurinol (ZYLOPRIM) 100 MG tablet TAKE 2 TABLETS BY MOUTH EVERY DAY AS DIRECTED 07/15/15   [provider]  aspirin EC 81 MG tablet Take 81 mg by mouth.    [provider]  atorvastatin (LIPITOR) 80 MG tablet TAKE ONE TABLET AT BEDTIME 04/15/15   [provider]  azelastine (ASTELIN) 0.1 % nasal spray Place 1-2 sprays into the nose 2 (two) times daily as needed for rhinitis.     [provider]  bumetanide (BUMEX) 2 MG tablet Take 24ms daily in the morning 12/03/15   [provider]  Coenzyme Q10 (COQ-10) 200 MG CAPS Take 200 mg by mouth every evening.    [provider]  Cyanocobalamin (B-12) 1000 MCG/ML KIT Inject 1,000 mcg as directed every 30 (thirty) days.    [provider]  ELIQUIS 5 MG TABS tablet Take 5 mg by mouth 2 (two) times daily.  12/16/15   [provider]  Melatonin 1 MG TABS Take 1 mg by mouth daily as needed (sleep).    [provider]  metolazone (ZAROXOLYN) 2.5 MG tablet Take 2.5 mg by mouth every other day.    [provider]  metoprolol tartrate (LOPRESSOR) 25 MG tablet Take 75 mg by mouth 2 (two) times daily.  12/16/15   [provider]   neomycin-bacitracin-polymyxin (NEOSPORIN) ointment Apply 1 application topically as needed for wound care. apply to eye    [provider]  oseltamivir (TAMIFLU) 75 MG capsule TAKE ONE CAPSULE BY MOUTH EVERY DAY FOR 10 DAYS 03/02/16   [provider]  spironolactone (ALDACTONE) 25 MG tablet Take 25 mg by mouth daily.  12/16/15   [provider]  telmisartan (MICARDIS) 80 MG tablet Take 80 mg by mouth daily.    [provider]  topiramate (TOPAMAX) 50 MG tablet Take by mouth. 09/09/15 09/08/16  [provider]  traMADol Veatrice Bourbon) 50 MG tablet  03/06/16   [provider]  umeclidinium-vilanterol (ANORO ELLIPTA) 62.5-25 MCG/INH AEPB Inhale 1 puff into the lungs 2 (two) times daily. 05/16/14   [provider]    REVIEW OF SYSTEMS:  Review of Systems  Constitutional: Negative for chills, fever, malaise/fatigue and weight loss.  HENT: Negative for ear pain, hearing loss and tinnitus.   Eyes: Negative for blurred vision, double vision, pain and redness.  Respiratory: Positive for cough, sputum production and shortness of breath. Negative for hemoptysis.   Cardiovascular: Negative for chest pain, palpitations, orthopnea and leg swelling.  Gastrointestinal: Negative for abdominal pain, constipation, diarrhea, nausea and vomiting.  Genitourinary: Negative for dysuria, frequency and hematuria.  Musculoskeletal: Negative for back pain, joint pain and neck pain.  Skin:       No acne, rash, or lesions  Neurological: Negative for dizziness, tremors, focal weakness and weakness.  Endo/Heme/Allergies: Negative for polydipsia. Does not bruise/bleed easily.  Psychiatric/Behavioral: Negative for depression. The patient is not nervous/anxious and does not have insomnia.      VITAL SIGNS:   Vitals:   12/23/16 0358 12/23/16 0359 12/23/16 0445 12/23/16 0515  BP: (!) 202/91  (!) 160/100 (!) 147/73  Pulse: (!) 121  (!) 102   Resp: (!) 30  (!) 29 (!) 25   Temp: 98.1 F (36.7 C)     TempSrc: Oral     SpO2: 97%  96%   Weight:  108.9 kg (240 lb)    Height:  6' (1.829 m)     Wt Readings from Last 3 Encounters:  12/23/16 108.9 kg (240 lb)  09/04/16 116.6 kg (257 lb)  07/17/16 106.6 kg (235 lb)    PHYSICAL EXAMINATION:  Physical Exam  Vitals reviewed. Constitutional: He is oriented to person, place, and time. He appears well-developed and well-nourished. No distress.  HENT:  Head: Normocephalic and atraumatic.  Mouth/Throat: Oropharynx is clear and moist.  Eyes: Conjunctivae and EOM are normal. Pupils are equal, round, and reactive to light. No scleral icterus.  Neck: Normal range of motion. Neck supple. No JVD present. No thyromegaly present.  Cardiovascular: Intact distal pulses. Exam reveals no gallop and no  friction rub.  No murmur heard. Tachycardic, irregular rhythm  Respiratory: He is in respiratory distress. He has no wheezes. He has no rales.  Right greater than left basilar rhonchi  GI: Soft. Bowel sounds are normal. He exhibits no distension. There is no tenderness.  Musculoskeletal: Normal range of motion. He exhibits no edema.  No arthritis, no gout  Lymphadenopathy:    He has no cervical adenopathy.  Neurological: He is alert and oriented to person, place, and time. No cranial nerve deficit.  No dysarthria, no aphasia  Skin: Skin is warm and dry. No rash noted. No erythema.  Psychiatric: He has a normal mood and affect. His behavior is normal. Judgment and thought content normal.    LABORATORY PANEL:   CBC Recent Labs  Lab 12/23/16 0405  WBC 15.6*  HGB 16.2  HCT 49.2  PLT 171   ------------------------------------------------------------------------------------------------------------------  Chemistries  Recent Labs  Lab 12/23/16 0405  NA 138  K 4.7  CL 103  CO2 24  GLUCOSE 144*  BUN 37*  CREATININE 1.00  CALCIUM 9.3  MG 2.1  AST 25  ALT 15*  ALKPHOS 92  BILITOT 1.2    ------------------------------------------------------------------------------------------------------------------  Cardiac Enzymes Recent Labs  Lab 12/23/16 0405  TROPONINI <0.03   ------------------------------------------------------------------------------------------------------------------  RADIOLOGY:  Dg Chest Portable 1 View  Result Date: 12/23/2016 CLINICAL DATA:  Shortness of breath for couple of hours. History of CHF, hypertension, former smoker. EXAM: PORTABLE CHEST 1 VIEW COMPARISON:  None. FINDINGS: Postoperative changes in the mediastinum. Fractures of multiple sternotomy wires. Shallow inspiration with linear atelectasis in the lung bases. Asymmetric infiltration in the left lung base could represent pneumonia. No pneumothorax. No blunting of costophrenic angles. Cardiac enlargement without vascular congestion. Calcification of the aorta. IMPRESSION: Shallow inspiration with linear atelectasis in the lung bases. Possible pneumonia in the left lung base. Aortic atherosclerosis. Cardiac enlargement. Electronically Signed   By: Lucienne Capers M.D.   On: 12/23/2016 04:45    EKG:   Orders placed or performed during the hospital encounter of 12/23/16  . ED EKG  . ED EKG    IMPRESSION AND PLAN:  Principal Problem:   Sepsis ([place]) -etiology as his pneumonia as below, IV antibiotics started, patient is hemodynamically stable, cultures sent, lactic acid pending Active Problems:   CAP (community acquired pneumonia) -IV antibiotics as above, supportive treatments with duo nebs and antitussive, sputum culture   Essential hypertension -elevated, continue home antihypertensives with additional as needed antihypertensives for blood pressure goal less than 160/100   A. fib with RVR -continue home rate controlling medications, continue home dose anticoagulation, IV Lopressor here now to get his heart rate to goal less than 110   CAD (coronary artery disease) of artery bypass graft  -continue home medications   Hyperlipidemia -home dose statin  All the records are reviewed and case discussed with ED provider. Management plans discussed with the patient and/or family.  DVT PROPHYLAXIS: Systemic anticoagulation  GI PROPHYLAXIS: None  ADMISSION STATUS: Inpatient  CODE STATUS: Full Code Status History    Date Active Date Inactive Code Status Order ID Comments User Context   12/30/2015 14:29 12/30/2015 18:59 Full Code [id number]  Algernon Huxley, MD Inpatient    Advance Directive Documentation     Most Recent Value  Type of Advance Directive  Living will  Pre-existing out of facility DNR order (yellow form or pink MOST form)  No data  "MOST" Form in Place?  No data      TOTAL  TIME TAKING CARE OF THIS PATIENT: 45 minutes.   Jannifer Franklin, Norman Morales FIELDING 12/23/2016, 5:23 AM  [facility]  Office  [phone]  CC: Primary care physician; Adin Hector, MD  Note:  This document was prepared using Dragon voice recognition software and may include unintentional dictation errors.

## 2016-12-23 NOTE — ED Notes (Addendum)
Date and time results received: 12/23/16 0645  Test: Lactic Acid  Critical Value: 2.6  Name of Provider Notified: Dr. Brigitte Pulse

## 2016-12-23 NOTE — Progress Notes (Signed)
Paged by nurse regarding patient and family having questions.  Patient seen and examined.  S1, S2 heard.  Bilateral wheezing  * CAP * Sepsis POA * Acute hypoxic resp failure * HTN   IV abx Steroids and nebs added. Flutter valve  All questions answered. Code status discussed and FULL CODE  Time spent 45 minutes

## 2016-12-23 NOTE — ED Provider Notes (Signed)
[facility] Emergency Department Provider Note  ____________________________________________   First MD Initiated Contact with Patient 12/23/16 0406     (approximate)  I have reviewed the triage vital signs and the nursing notes.   HISTORY  Chief Complaint Shortness of Breath  Level 5 caveat:  history/ROS limited by acute/critical illness  HPI Norman Morales is a 83 y.o. male with medical history as listed below who presents by EMS for evaluation of acute onset and severe shortness of breath.  He states he has been in his usual state of health for the last few days and does state that he always has issues with his lungs but he does not use oxygen at home.  He states that he woke up prior to calling EMS and was in severe distress.  He needs to set up to be able to breathe, cannot lie flat or even recline, and had significantly increased work of breathing.  Paramedics discovered that his oxygen saturation was in the low to mid 70s, but they felt he was not using a significant amount of accessory muscles and did not appear to be laboring heavily.  On a nonrebreather his oxygen level came up to the mid 90s and upon arrival he stated that he felt a little bit better.  He denies any chest pain, fever/chills, nausea, vomiting, and abdominal pain.  He states he takes Bumex as a diuretic and has been compliant with his medications.  Past Medical History:  Diagnosis Date  . CHF (congestive heart failure) ([place])   . Coronary artery disease   . Hyperlipidemia   . Hypertension     Patient Active Problem List   Diagnosis Date Noted  . Sepsis ([place]) 12/23/2016  . CAP (community acquired pneumonia) 12/23/2016  . Hyperlipidemia 09/04/2016  . PAD (peripheral artery disease) ([place]) 09/04/2016  . Swelling of limb 03/27/2016  . Arrhythmia 12/24/2015  . Essential hypertension 12/24/2015  . Congestive heart failure ([place]) 12/24/2015  . Atherosclerosis of native arteries of the  extremities with ulceration ([place]) 12/24/2015  . CAD (coronary artery disease) of artery bypass graft 12/24/2015    Past Surgical History:  Procedure Laterality Date  . CARDIAC SURGERY    . CATARACT EXTRACTION, BILATERAL    . EXPLORATION POST OPERATIVE OPEN HEART    . LUMBAR FUSION    . PERIPHERAL VASCULAR CATHETERIZATION Left 12/30/2015   Procedure: Lower Extremity Angiography;  Surgeon: Algernon Huxley, MD;  Location: [facility];  Service: Cardiovascular;  Laterality: Left;  . PERIPHERAL VASCULAR CATHETERIZATION  12/30/2015   Procedure: Lower Extremity Intervention;  Surgeon: Algernon Huxley, MD;  Location: [facility];  Service: Cardiovascular;;  . REPLACEMENT TOTAL KNEE BILATERAL      Prior to Admission medications   Medication Sig Start Date End Date Taking? Authorizing Provider  acetaminophen (TYLENOL) 325 MG tablet Take 650 mg by mouth daily as needed for mild pain or headache.    [provider]  Acetylcysteine 600 MG CAPS Take 1 capsule by mouth daily.    [provider]  albuterol (PROVENTIL HFA;VENTOLIN HFA) 108 (90 Base) MCG/ACT inhaler Inhale into the lungs every 6 (six) hours as needed for wheezing or shortness of breath.    [provider]  allopurinol (ZYLOPRIM) 100 MG tablet TAKE 2 TABLETS BY MOUTH EVERY DAY AS DIRECTED 07/15/15   [provider]  aspirin EC 81 MG tablet Take 81 mg by mouth.    [provider]  atorvastatin (  LIPITOR) 80 MG tablet TAKE ONE TABLET AT BEDTIME 04/15/15   [provider]  azelastine (ASTELIN) 0.1 % nasal spray Place 1-2 sprays into the nose 2 (two) times daily as needed for rhinitis.     [provider]  bumetanide (BUMEX) 2 MG tablet Take 39ms daily in the morning 12/03/15   [provider]  Coenzyme Q10 (COQ-10) 200 MG CAPS Take 200 mg by mouth every evening.    [provider]  Cyanocobalamin (B-12) 1000 MCG/ML KIT Inject 1,000 mcg as directed every 30  (thirty) days.    [provider]  ELIQUIS 5 MG TABS tablet Take 5 mg by mouth 2 (two) times daily.  12/16/15   [provider]  Melatonin 1 MG TABS Take 1 mg by mouth daily as needed (sleep).    [provider]  metolazone (ZAROXOLYN) 2.5 MG tablet Take 2.5 mg by mouth every other day.    [provider]  metoprolol tartrate (LOPRESSOR) 25 MG tablet Take 75 mg by mouth 2 (two) times daily.  12/16/15   [provider]  neomycin-bacitracin-polymyxin (NEOSPORIN) ointment Apply 1 application topically as needed for wound care. apply to eye    [provider]  oseltamivir (TAMIFLU) 75 MG capsule TAKE ONE CAPSULE BY MOUTH EVERY DAY FOR 10 DAYS 03/02/16   [provider]  spironolactone (ALDACTONE) 25 MG tablet Take 25 mg by mouth daily.  12/16/15   [provider]  telmisartan (MICARDIS) 80 MG tablet Take 80 mg by mouth daily.    [provider]  topiramate (TOPAMAX) 50 MG tablet Take by mouth. 09/09/15 09/08/16  [provider]  traMADol (Veatrice Bourbon 50 MG tablet  03/06/16   [provider]  umeclidinium-vilanterol (ANORO ELLIPTA) 62.5-25 MCG/INH AEPB Inhale 1 puff into the lungs 2 (two) times daily. 05/16/14   [provider]    Allergies Patient has no known allergies.  Family History  Problem Relation Age of Onset  . Aortic aneurysm Mother     Social History Social History   Tobacco Use  . Smoking status: Former Smoker    Packs/day: 1.00    Years: 42.00    Pack years: 42.00    Types: Cigarettes  . Smokeless tobacco: Never Used  . Tobacco comment: quit 42 years ago  Substance Use Topics  . Alcohol use: Yes    Comment: occasionally  . Drug use: No    Review of Systems Level 5 caveat:  history/ROS limited by acute/critical illness, but the patient endorses shortness of breath and denies chest pain ____________________________________________   PHYSICAL EXAM:  VITAL SIGNS: ED  Triage Vitals  Enc Vitals Group     BP 12/23/16 0358 (!) 202/91     Pulse Rate 12/23/16 0358 (!) 121     Resp 12/23/16 0358 (!) 30     Temp 12/23/16 0358 98.1 F (36.7 C)     Temp Source 12/23/16 0358 Oral     SpO2 12/23/16 0358 97 %     Weight 12/23/16 0359 108.9 kg (240 lb)     Height 12/23/16 0359 1.829 m (6')     Head Circumference --      Peak Flow --      Pain Score 12/23/16 0357 0     Pain Loc --      Pain Edu? --      Excl. in [place] --     Constitutional: Alert and oriented.  Appears chronically ill and in mild respiratory  distress on a nonrebreather upon arrival Eyes: Conjunctivae are normal.  Head: Atraumatic. Nose: No congestion/rhinnorhea. Mouth/Throat: Mucous membranes are moist. Neck: No stridor.  No meningeal signs.   Cardiovascular: Tachycardia, irregular rhythm (with history of a-fib). Good peripheral circulation. Grossly normal heart sounds. Respiratory: Normal respiratory effort.  No retractions. Lungs CTAB. Gastrointestinal: Soft and nontender. +Distention, possibly chronic Musculoskeletal: No lower extremity tenderness nor edema but chronic skin thickening/darkening in lower extremities. No gross deformities of extremities. Neurologic:  Normal speech and language. No gross focal neurologic deficits are appreciated.  Skin:  Skin is warm, dry and intact. No rash noted. Psychiatric: Mood and affect are normal. Speech and behavior are normal.  ____________________________________________   LABS (all labs ordered are listed, but only abnormal results are displayed)  Labs Reviewed  CBC WITH DIFFERENTIAL/PLATELET - Abnormal; Notable for the following components:      Result Value   WBC 15.6 (*)    MCV 103.7 (*)    MCH 34.1 (*)    RDW 17.6 (*)    Neutro Abs 13.9 (*)    All other components within normal limits  BRAIN NATRIURETIC PEPTIDE - Abnormal; Notable for the following components:   B Natriuretic Peptide 226.0 (*)    All other components within normal  limits  COMPREHENSIVE METABOLIC PANEL - Abnormal; Notable for the following components:   Glucose, Bld 144 (*)    BUN 37 (*)    ALT 15 (*)    All other components within normal limits  CULTURE, BLOOD (ROUTINE X 2)  CULTURE, BLOOD (ROUTINE X 2)  TROPONIN I  MAGNESIUM  LACTIC ACID, PLASMA  LACTIC ACID, PLASMA   ____________________________________________  EKG  ED ECG REPORT I, Hinda Kehr, the attending physician, personally viewed and interpreted this ECG.  Date: 12/23/2016 EKG Time: 4:05 AM Rate: 107 Rhythm: atrial fibrillation QRS Axis: normal Intervals: a-fib, otherwise unremarkable ST/T Wave abnormalities: Non-specific ST segment / T-wave changes, but no evidence of acute ischemia. Narrative Interpretation: no evidence of acute ischemia   ____________________________________________  RADIOLOGY I, Hinda Kehr, personally viewed and evaluated these images (plain radiographs) as part of my medical decision making, as well as reviewing the written report by the radiologist.  Dg Chest Portable 1 View  Result Date: 12/23/2016 CLINICAL DATA:  Shortness of breath for couple of hours. History of CHF, hypertension, former smoker. EXAM: PORTABLE CHEST 1 VIEW COMPARISON:  None. FINDINGS: Postoperative changes in the mediastinum. Fractures of multiple sternotomy wires. Shallow inspiration with linear atelectasis in the lung bases. Asymmetric infiltration in the left lung base could represent pneumonia. No pneumothorax. No blunting of costophrenic angles. Cardiac enlargement without vascular congestion. Calcification of the aorta. IMPRESSION: Shallow inspiration with linear atelectasis in the lung bases. Possible pneumonia in the left lung base. Aortic atherosclerosis. Cardiac enlargement. Electronically Signed   By: Lucienne Capers M.D.   On: 12/23/2016 04:45    ____________________________________________   PROCEDURES  Critical Care performed: Yes, see critical care procedure  note(s)   Procedure(s) performed:   .Critical Care Performed by: Hinda Kehr, MD Authorized by: Hinda Kehr, MD   Critical care provider statement:    Critical care time (minutes):  45   Critical care time was exclusive of:  Separately billable procedures and treating other patients   Critical care was necessary to treat or prevent imminent or life-threatening deterioration of the following conditions:  Cardiac failure and respiratory failure   Critical care was time spent personally by me on the following activities:  Development  of treatment plan with patient or surrogate, discussions with consultants, evaluation of patient's response to treatment, examination of patient, obtaining history from patient or surrogate, ordering and performing treatments and interventions, ordering and review of laboratory studies, ordering and review of radiographic studies, pulse oximetry, re-evaluation of patient's condition and review of old charts     ____________________________________________   INITIAL IMPRESSION / ASSESSMENT AND PLAN / ED COURSE  As part of my medical decision making, I reviewed the following data within the [facility] notes reviewed and incorporated, Labs reviewed , EKG interpreted , Old chart reviewed, Radiograph reviewed  and Discussed with admitting physician     Upon arrival the patient has mild respiratory distress but reportedly was profoundly hypoxemic.  He states he uses a CPAP at night.  Differential diagnosis includes, but is not limited to, ACS, CHF exacerbation, PE, pneumonia, dissection.  Most likely based on the initial presentation is acute on chronic CHF likely with some degree of pulmonary edema.  I will start him on BiPAP given that he uses a CPAP at home and was profoundly hypoxemic.  There is nothing to suggest hypercapnia at this time.  He is not having any chest pain but I will perform a typical cardiac workup.  His blood pressure  was elevated in the field but once he is on BiPAP I will see if he continues to be elevated, if so we will consider nitroglycerin either sublingual or as a drip.  Chest x-ray and EKG are pending.  Clinical Course as of Dec 23 513  Wed Dec 23, 2016  0443 WBC: (!) 15.6 [CF]  0514 CXR suggestive of PNA.  Treating for CAP, obtaining blood cultures, discussed with Dr. Jannifer Franklin who will admit.  [CF]    Clinical Course User Index [CF] Hinda Kehr, MD    ____________________________________________  FINAL CLINICAL IMPRESSION(S) / ED DIAGNOSES  Final diagnoses:  Acute respiratory failure with hypoxemia ([place])  Community acquired pneumonia of left lower lobe of lung ([place])     MEDICATIONS GIVEN DURING THIS VISIT:  Medications  cefTRIAXone (ROCEPHIN) 1 g in dextrose 5 % 50 mL IVPB - Premix (1 g Intravenous New Bag/Given 12/23/16 0514)  azithromycin (ZITHROMAX) 500 mg in dextrose 5 % 250 mL IVPB (not administered)     ED Discharge Orders    None       Note:  This document was prepared using Dragon voice recognition software and may include unintentional dictation errors.    Hinda Kehr, MD 12/23/16 [phone]

## 2016-12-23 NOTE — ED Notes (Signed)
Talked with Dr Darvin Neighbours, orders to take patient off bipap and place on [place].

## 2016-12-23 NOTE — Progress Notes (Signed)
Pharmacy Antibiotic Note  Norman Morales is a 83 y.o. male admitted on 12/23/2016 with pneumonia.  Pharmacy has been consulted for azithromycin/ceftriaxone dosing.  Plan: Patient received Azithromycin 500mg  IV x 1 and Ceftriaxone 1 gram IV x 1 in ER. Will continue with Azithromycin 500mg  IV q24h and Ceftriaxone 1 gram IV q24h.    Height: 6' (182.9 cm) Weight: 240 lb (108.9 kg) IBW/kg (Calculated) : 77.6  Temp (24hrs), Avg:98.1 F (36.7 C), Min:98.1 F (36.7 C), Max:98.1 F (36.7 C)  Recent Labs  Lab 12/23/16 0405 12/23/16 0450  WBC 15.6*  --   CREATININE 1.00  --   LATICACIDVEN  --  2.6*    Estimated Creatinine Clearance: 65.1 mL/min (by C-G formula based on SCr of 1 mg/dL).    No Known Allergies  Antimicrobials this admission: azith 11/21 >>    Ceftriaxone 11/21 >>    Dose adjustments this admission:    Microbiology results: 11/21 BCx: pend   UCx:      Sputum:      MRSA PCR:    Thank you for allowing pharmacy to be a part of this patient's care.  Ryun Velez A 12/23/2016 7:43 AM

## 2016-12-24 ENCOUNTER — Encounter: Payer: Self-pay | Admitting: Internal Medicine

## 2016-12-24 LAB — BASIC METABOLIC PANEL
ANION GAP: 10 (ref 5–15)
BUN: 52 mg/dL — ABNORMAL HIGH (ref 6–20)
CALCIUM: 8.7 mg/dL — AB (ref 8.9–10.3)
CO2: 27 mmol/L (ref 22–32)
Chloride: 97 mmol/L — ABNORMAL LOW (ref 101–111)
Creatinine, Ser: 1.39 mg/dL — ABNORMAL HIGH (ref 0.61–1.24)
GFR calc non Af Amer: 44 mL/min — ABNORMAL LOW (ref >60)
GFR, EST AFRICAN AMERICAN: 51 mL/min — AB (ref >60)
Glucose, Bld: 142 mg/dL — ABNORMAL HIGH (ref 65–99)
Potassium: 4.7 mmol/L (ref 3.5–5.1)
SODIUM: 134 mmol/L — AB (ref 135–145)

## 2016-12-24 LAB — CBC
HCT: 42.3 % (ref 40.0–52.0)
HEMOGLOBIN: 14 g/dL (ref 13.0–18.0)
MCH: 34.1 pg — AB (ref 26.0–34.0)
MCHC: 33.1 g/dL (ref 32.0–36.0)
MCV: 102.9 fL — ABNORMAL HIGH (ref 80.0–100.0)
Platelets: 155 10*3/uL (ref 150–440)
RBC: 4.11 MIL/uL — AB (ref 4.40–5.90)
RDW: 17.3 % — ABNORMAL HIGH (ref 11.5–14.5)
WBC: 18.8 10*3/uL — AB (ref 3.8–10.6)

## 2016-12-24 LAB — HEMOGLOBIN: HEMOGLOBIN: 14.6 g/dL (ref 13.0–18.0)

## 2016-12-24 MED ORDER — METOPROLOL TARTRATE 50 MG PO TABS
50.0000 mg | ORAL_TABLET | Freq: Two times a day (BID) | ORAL | Status: DC
  Administered 2016-12-24 – 2016-12-27 (×7): 50 mg via ORAL
  Filled 2016-12-24 (×7): qty 1

## 2016-12-24 MED ORDER — ASPIRIN EC 81 MG PO TBEC
81.0000 mg | DELAYED_RELEASE_TABLET | Freq: Every day | ORAL | Status: DC
  Administered 2016-12-24 – 2016-12-26 (×3): 81 mg via ORAL
  Filled 2016-12-24 (×3): qty 1

## 2016-12-24 NOTE — Progress Notes (Signed)
Pt has refused cpap tonight 

## 2016-12-24 NOTE — Progress Notes (Signed)
[facility] at [place] NAME: Norman Morales    MR#:  [id number]  DATE OF BIRTH:  Aug 11, 1926  SUBJECTIVE:  CHIEF COMPLAINT:   Chief Complaint  Patient presents with  . Shortness of Breath   Is some improvement in shortness of breath.  Dry cough.  On 4 L oxygen.  REVIEW OF SYSTEMS:    Review of Systems  Constitutional: Positive for malaise/fatigue. Negative for chills and fever.  HENT: Negative for sore throat.   Eyes: Negative for blurred vision, double vision and pain.  Respiratory: Positive for cough and shortness of breath. Negative for hemoptysis and wheezing.   Cardiovascular: Negative for chest pain, palpitations, orthopnea and leg swelling.  Gastrointestinal: Negative for abdominal pain, constipation, diarrhea, heartburn, nausea and vomiting.  Genitourinary: Negative for dysuria and hematuria.  Musculoskeletal: Positive for back pain. Negative for joint pain.  Skin: Negative for rash.  Neurological: Positive for weakness. Negative for sensory change, speech change, focal weakness and headaches.  Endo/Heme/Allergies: Does not bruise/bleed easily.  Psychiatric/Behavioral: Negative for depression. The patient is not nervous/anxious.     DRUG ALLERGIES:  No Known Allergies  VITALS:  Blood pressure 138/80, pulse 77, temperature (!) 97.5 F (36.4 C), temperature source Oral, resp. rate 18, height 6' (1.829 m), weight 108.9 kg (240 lb), SpO2 93 %.  PHYSICAL EXAMINATION:   Physical Exam  GENERAL:  83 y.o.-year-old patient lying in the bed , obese.  Some conversational dyspnea. EYES: Pupils equal, round, reactive to light and accommodation. No scleral icterus. Extraocular muscles intact.  HEENT: Head atraumatic, normocephalic. Oropharynx and nasopharynx clear.  NECK:  Supple, no jugular venous distention. No thyroid enlargement, no tenderness.  LUNGS: Bilateral wheezing and crackles CARDIOVASCULAR: S1, S2 normal. No murmurs, rubs, or  gallops.  ABDOMEN: Soft, nontender, nondistended. Bowel sounds present. No organomegaly or mass.  EXTREMITIES: No cyanosis, clubbing or edema b/l.    NEUROLOGIC: Cranial nerves II through XII are intact. No focal Motor or sensory deficits b/l.   PSYCHIATRIC: The patient is alert and oriented x 3.  SKIN: No obvious rash, lesion, or ulcer.   LABORATORY PANEL:   CBC Recent Labs  Lab 12/24/16 0308 12/24/16 0948  WBC 18.8*  --   HGB 14.0 14.6  HCT 42.3  --   PLT 155  --    ------------------------------------------------------------------------------------------------------------------ Chemistries  Recent Labs  Lab 12/23/16 0405 12/24/16 0308  NA 138 134*  K 4.7 4.7  CL 103 97*  CO2 24 27  GLUCOSE 144* 142*  BUN 37* 52*  CREATININE 1.00 1.39*  CALCIUM 9.3 8.7*  MG 2.1  --   AST 25  --   ALT 15*  --   ALKPHOS 92  --   BILITOT 1.2  --    ------------------------------------------------------------------------------------------------------------------  Cardiac Enzymes Recent Labs  Lab 12/23/16 0405  TROPONINI <0.03   ------------------------------------------------------------------------------------------------------------------  RADIOLOGY:  Dg Chest Portable 1 View  Result Date: 12/23/2016 CLINICAL DATA:  Shortness of breath for couple of hours. History of CHF, hypertension, former smoker. EXAM: PORTABLE CHEST 1 VIEW COMPARISON:  None. FINDINGS: Postoperative changes in the mediastinum. Fractures of multiple sternotomy wires. Shallow inspiration with linear atelectasis in the lung bases. Asymmetric infiltration in the left lung base could represent pneumonia. No pneumothorax. No blunting of costophrenic angles. Cardiac enlargement without vascular congestion. Calcification of the aorta. IMPRESSION: Shallow inspiration with linear atelectasis in the lung bases. Possible pneumonia in the left lung base. Aortic atherosclerosis. Cardiac enlargement. Electronically Signed  By: Lucienne Capers M.D.   On: 12/23/2016 04:45     ASSESSMENT AND PLAN:   * CAP with acute hypoxic respiratory failure On IV antibiotics.  Prednisone and nebulizers added.  Continue oxygen and wean to room air as tolerated.  Encourage patient to ambulate.  * Sepsis POA Resolved  * HTN Blood pressure medication held initially on admission due to hypotension.  Will restart metoprolol now that blood pressure is improved.  *Chronic diastolic congestive heart failure.  On Bumex.  All the records are reviewed and case discussed with Care Management/Social Worker Management plans discussed with the patient, family and they are in agreement.  CODE STATUS: FULL CODE  DVT Prophylaxis: SCDs  TOTAL TIME TAKING CARE OF THIS PATIENT: 35 minutes.   POSSIBLE D/C IN 1-2 DAYS, DEPENDING ON CLINICAL CONDITION.  Neita Carp M.D on 12/24/2016 at 12:41 PM  Between 7am to 6pm - Pager - [phone]  After 6pm go to [email or web address] - password [email or web address] [facility]  [facility]  Office  [phone]  CC: Primary care physician; Adin Hector, MD  Note: This dictation was prepared with Dragon dictation along with smaller phrase technology. Any transcriptional errors that result from this process are unintentional.

## 2016-12-24 NOTE — Progress Notes (Signed)
Patient seen sitting up at side of bed in no distress. Patient has refused bipap for the night. States he just wants to use o2. Asked patient to have RN call RT if he changes his mind.

## 2016-12-25 ENCOUNTER — Inpatient Hospital Stay: Payer: Medicare Other

## 2016-12-25 LAB — BASIC METABOLIC PANEL
Anion gap: 10 (ref 5–15)
BUN: 63 mg/dL — AB (ref 6–20)
CHLORIDE: 97 mmol/L — AB (ref 101–111)
CO2: 27 mmol/L (ref 22–32)
CREATININE: 1.52 mg/dL — AB (ref 0.61–1.24)
Calcium: 8.8 mg/dL — ABNORMAL LOW (ref 8.9–10.3)
GFR calc Af Amer: 45 mL/min — ABNORMAL LOW (ref >60)
GFR calc non Af Amer: 39 mL/min — ABNORMAL LOW (ref >60)
GLUCOSE: 139 mg/dL — AB (ref 65–99)
Potassium: 4.6 mmol/L (ref 3.5–5.1)
SODIUM: 134 mmol/L — AB (ref 135–145)

## 2016-12-25 LAB — CBC WITH DIFFERENTIAL/PLATELET
Basophils Absolute: 0 10*3/uL (ref 0–0.1)
Basophils Relative: 0 %
EOS ABS: 0 10*3/uL (ref 0–0.7)
Eosinophils Relative: 0 %
HCT: 40.8 % (ref 40.0–52.0)
HEMOGLOBIN: 13.3 g/dL (ref 13.0–18.0)
LYMPHS ABS: 0.6 10*3/uL — AB (ref 1.0–3.6)
Lymphocytes Relative: 4 %
MCH: 33.9 pg (ref 26.0–34.0)
MCHC: 32.7 g/dL (ref 32.0–36.0)
MCV: 103.5 fL — ABNORMAL HIGH (ref 80.0–100.0)
MONOS PCT: 9 %
Monocytes Absolute: 1.3 10*3/uL — ABNORMAL HIGH (ref 0.2–1.0)
NEUTROS PCT: 87 %
Neutro Abs: 13.2 10*3/uL — ABNORMAL HIGH (ref 1.4–6.5)
Platelets: 170 10*3/uL (ref 150–440)
RBC: 3.94 MIL/uL — ABNORMAL LOW (ref 4.40–5.90)
RDW: 17.3 % — ABNORMAL HIGH (ref 11.5–14.5)
WBC: 15.2 10*3/uL — ABNORMAL HIGH (ref 3.8–10.6)

## 2016-12-25 MED ORDER — GUAIFENESIN-DM 100-10 MG/5ML PO SYRP
5.0000 mL | ORAL_SOLUTION | ORAL | 0 refills | Status: DC | PRN
Start: 2016-12-25 — End: 2017-03-29

## 2016-12-25 MED ORDER — ZOLPIDEM TARTRATE 5 MG PO TABS: 5.0000 mg | ORAL_TABLET | Freq: Every evening | ORAL | 0 refills | Status: DC | PRN

## 2016-12-25 MED ORDER — PREDNISONE 10 MG (21) PO TBPK: ORAL_TABLET | ORAL | 0 refills | Status: DC

## 2016-12-25 MED ORDER — LEVOFLOXACIN 250 MG PO TABS: 250.0000 mg | ORAL_TABLET | Freq: Every day | ORAL | 0 refills | Status: DC

## 2016-12-25 MED ORDER — SODIUM CHLORIDE 0.9% FLUSH
3.0000 mL | Freq: Two times a day (BID) | INTRAVENOUS | Status: DC
  Administered 2016-12-26 – 2016-12-27 (×3): 3 mL via INTRAVENOUS

## 2016-12-25 MED ORDER — SPIRONOLACTONE 25 MG PO TABS
25.0000 mg | ORAL_TABLET | Freq: Every day | ORAL | Status: DC
  Administered 2016-12-25 – 2016-12-27 (×3): 25 mg via ORAL
  Filled 2016-12-25 (×3): qty 1

## 2016-12-25 MED ORDER — ALBUTEROL SULFATE HFA 108 (90 BASE) MCG/ACT IN AERS: 2.0000 | INHALATION_SPRAY | RESPIRATORY_TRACT | 1 refills | Status: DC | PRN

## 2016-12-25 NOTE — Plan of Care (Signed)
  Progressing Education: Knowledge of General Education information will improve 12/25/2016 1307 - Progressing by Rolley Sims, RN Health Behavior/Discharge Planning: Ability to manage health-related needs will improve 12/25/2016 1307 - Progressing by Rolley Sims, RN Clinical Measurements: Ability to maintain clinical measurements within normal limits will improve 12/25/2016 1307 - Progressing by Rolley Sims, RN

## 2016-12-25 NOTE — Progress Notes (Signed)
[facility] at [place] NAME: Lark Runk    MR#:  [id number]  DATE OF BIRTH:  1926/04/22  SUBJECTIVE:  CHIEF COMPLAINT:   Chief Complaint  Patient presents with  . Shortness of Breath   SOB improving Specks of blood in sputum  REVIEW OF SYSTEMS:    Review of Systems  Constitutional: Positive for malaise/fatigue. Negative for chills and fever.  HENT: Negative for sore throat.   Eyes: Negative for blurred vision, double vision and pain.  Respiratory: Positive for cough and shortness of breath. Negative for hemoptysis and wheezing.   Cardiovascular: Negative for chest pain, palpitations, orthopnea and leg swelling.  Gastrointestinal: Negative for abdominal pain, constipation, diarrhea, heartburn, nausea and vomiting.  Genitourinary: Negative for dysuria and hematuria.  Musculoskeletal: Positive for back pain. Negative for joint pain.  Skin: Negative for rash.  Neurological: Positive for weakness. Negative for sensory change, speech change, focal weakness and headaches.  Endo/Heme/Allergies: Does not bruise/bleed easily.  Psychiatric/Behavioral: Negative for depression. The patient is not nervous/anxious.     DRUG ALLERGIES:  No Known Allergies  VITALS:  Blood pressure (!) 172/81, pulse (!) 102, temperature 98 F (36.7 C), resp. rate 18, height 6' (1.829 m), weight 108.9 kg (240 lb), SpO2 99 %.  PHYSICAL EXAMINATION:   Physical Exam  GENERAL:  83 y.o.-year-old patient lying in the bed , obese.  EYES: Pupils equal, round, reactive to light and accommodation. No scleral icterus. Extraocular muscles intact.  HEENT: Head atraumatic, normocephalic. Oropharynx and nasopharynx clear.  NECK:  Supple, no jugular venous distention. No thyroid enlargement, no tenderness.  LUNGS: Mild wheezing CARDIOVASCULAR: S1, S2 normal. No murmurs, rubs, or gallops.  ABDOMEN: Soft, nontender, nondistended. Bowel sounds present. No organomegaly or mass.   EXTREMITIES: No cyanosis, clubbing or edema b/l.    NEUROLOGIC: Cranial nerves II through XII are intact. No focal Motor or sensory deficits b/l.   PSYCHIATRIC: The patient is alert and oriented x 3.  SKIN: No obvious rash, lesion, or ulcer.   LABORATORY PANEL:   CBC Recent Labs  Lab 12/25/16 0438  WBC 15.2*  HGB 13.3  HCT 40.8  PLT 170   ------------------------------------------------------------------------------------------------------------------ Chemistries  Recent Labs  Lab 12/23/16 0405  12/25/16 0438  NA 138   < > 134*  K 4.7   < > 4.6  CL 103   < > 97*  CO2 24   < > 27  GLUCOSE 144*   < > 139*  BUN 37*   < > 63*  CREATININE 1.00   < > 1.52*  CALCIUM 9.3   < > 8.8*  MG 2.1  --   --   AST 25  --   --   ALT 15*  --   --   ALKPHOS 92  --   --   BILITOT 1.2  --   --    < > = values in this interval not displayed.   ------------------------------------------------------------------------------------------------------------------  Cardiac Enzymes Recent Labs  Lab 12/23/16 0405  TROPONINI <0.03   ------------------------------------------------------------------------------------------------------------------  RADIOLOGY:  Dg Chest 2 View  Result Date: 12/25/2016 CLINICAL DATA:  Intubated for shortness of breath 4 days ago. History of congestive heart failure, coronary artery disease and hypertension. EXAM: CHEST  2 VIEW COMPARISON:  Portable chest 12/23/2016.  Chest CT 06/23/2013. FINDINGS: There is stable cardiomegaly and aortic atherosclerosis status post median sternotomy and CABG. Multiple sternotomy wires are fractured as on previous studies. There are chronic low lung  volumes with chronic bibasilar atelectasis or scarring. No superimposed airspace disease or enlarging pulmonary nodule identified. There is no pleural effusion or pneumothorax. No acute osseous findings are seen. Chronic rib deformities are present on the left. IMPRESSION: No evidence of  acute cardiopulmonary process. Cardiomegaly with chronic bibasilar atelectasis or scarring. Electronically Signed   By: Richardean Sale M.D.   On: 12/25/2016 11:11     ASSESSMENT AND PLAN:   * CAP with acute hypoxic respiratory failure On IV antibiotics.  Prednisone and nebulizers added.   Wean O2 as tolerated. May need O2 at home  * Sepsis POA Resolved  * HTN Continue home meds  * Chronic diastolic congestive heart failure.  On Bumex. Repeat CXR shows no pulm edema  All the records are reviewed and case discussed with Care Management/Social Worker Management plans discussed with the patient, family and they are in agreement.  CODE STATUS: FULL CODE  DVT Prophylaxis: SCDs  TOTAL TIME TAKING CARE OF THIS PATIENT: 35 minutes.   POSSIBLE D/C IN 1-2 DAYS, DEPENDING ON CLINICAL CONDITION.  Neita Carp M.D on 12/25/2016 at 2:43 PM  Between 7am to 6pm - Pager - [phone]  After 6pm go to [email or web address] - password [email or web address] [facility]  [facility]  Office  [phone]  CC: Primary care physician; Adin Hector, MD  Note: This dictation was prepared with Dragon dictation along with smaller phrase technology. Any transcriptional errors that result from this process are unintentional.

## 2016-12-25 NOTE — Care Management (Signed)
Patient has qualified for home oxygen and agency preference is [facility]. Patient adamantly declines home health nurse follow up  as he is a retired Community education officer and his wife is a retired Therapist, sports.  Anticipate discharge home 11/24.  Requested oxygen order so can have it set up today. Jermaine with [facility] is aware of the oxygen referral and stated that patient's wife wants Patient to have a home health nurse.  CM again asked patient and he has "no."

## 2016-12-25 NOTE — Care Management Important Message (Signed)
Important Message  Patient Details  Name: Norman Morales MRN: [id number] Date of Birth: [birth year]   Medicare Important Message Given:  Yes Signed IM notice given    Katrina Stack, RN 12/25/2016, 6:57 PM

## 2016-12-25 NOTE — Progress Notes (Signed)
SATURATION QUALIFICATIONS: (This note is used to comply with regulatory documentation for home oxygen)  Patient Saturations on Room Air at Rest = 98%  Patient Saturations on Room Air while Ambulating = 88%  Patient Saturations on 3 Liters of oxygen while Ambulating = 93%

## 2016-12-26 MED ORDER — FLEET ENEMA 7-19 GM/118ML RE ENEM: 1.0000 | ENEMA | Freq: Once | RECTAL | Status: DC

## 2016-12-26 NOTE — Progress Notes (Signed)
[place] at [place] NAME: Norman Morales    MR#:  [id number]  DATE OF BIRTH:  [birth year]  SUBJECTIVE:  CHIEF COMPLAINT:   Chief Complaint  Patient presents with  . Shortness of Breath   Still has SOB. Streaks of blood in sputum  REVIEW OF SYSTEMS:    Review of Systems  Constitutional: Positive for malaise/fatigue. Negative for chills and fever.  HENT: Negative for sore throat.   Eyes: Negative for blurred vision, double vision and pain.  Respiratory: Positive for cough and shortness of breath. Negative for hemoptysis and wheezing.   Cardiovascular: Negative for chest pain, palpitations, orthopnea and leg swelling.  Gastrointestinal: Negative for abdominal pain, constipation, diarrhea, heartburn, nausea and vomiting.  Genitourinary: Negative for dysuria and hematuria.  Musculoskeletal: Positive for back pain. Negative for joint pain.  Skin: Negative for rash.  Neurological: Positive for weakness. Negative for sensory change, speech change, focal weakness and headaches.  Endo/Heme/Allergies: Does not bruise/bleed easily.  Psychiatric/Behavioral: Negative for depression. The patient is not nervous/anxious.     DRUG ALLERGIES:  No Known Allergies  VITALS:  Blood pressure (!) 145/73, pulse 72, temperature 98.2 F (36.8 C), temperature source Oral, resp. rate 18, height 6' (1.829 m), weight 108.9 kg (240 lb), SpO2 (!) 88 %.  PHYSICAL EXAMINATION:   Physical Exam  GENERAL:  83 y.o.-year-old patient lying in the bed , obese.  EYES: Pupils equal, round, reactive to light and accommodation. No scleral icterus. Extraocular muscles intact.  HEENT: Head atraumatic, normocephalic. Oropharynx and nasopharynx clear.  NECK:  Supple, no jugular venous distention. No thyroid enlargement, no tenderness.  LUNGS: Mild wheezing CARDIOVASCULAR: S1, S2 normal. No murmurs, rubs, or gallops.  ABDOMEN: Soft, nontender, nondistended. Bowel sounds present.  No organomegaly or mass.  EXTREMITIES: No cyanosis, clubbing or edema b/l.    NEUROLOGIC: Cranial nerves II through XII are intact. No focal Motor or sensory deficits b/l.   PSYCHIATRIC: The patient is alert and oriented x 3.  SKIN: No obvious rash, lesion, or ulcer.   LABORATORY PANEL:   CBC Recent Labs  Lab 12/25/16 0438  WBC 15.2*  HGB 13.3  HCT 40.8  PLT 170   ------------------------------------------------------------------------------------------------------------------ Chemistries  Recent Labs  Lab 12/23/16 0405  12/25/16 0438  NA 138   < > 134*  K 4.7   < > 4.6  CL 103   < > 97*  CO2 24   < > 27  GLUCOSE 144*   < > 139*  BUN 37*   < > 63*  CREATININE 1.00   < > 1.52*  CALCIUM 9.3   < > 8.8*  MG 2.1  --   --   AST 25  --   --   ALT 15*  --   --   ALKPHOS 92  --   --   BILITOT 1.2  --   --    < > = values in this interval not displayed.   ------------------------------------------------------------------------------------------------------------------  Cardiac Enzymes Recent Labs  Lab 12/23/16 0405  TROPONINI <0.03   ------------------------------------------------------------------------------------------------------------------  RADIOLOGY:  Dg Chest 2 View  Result Date: 12/25/2016 CLINICAL DATA:  Intubated for shortness of breath 4 days ago. History of congestive heart failure, coronary artery disease and hypertension. EXAM: CHEST  2 VIEW COMPARISON:  Portable chest 12/23/2016.  Chest CT 06/23/2013. FINDINGS: There is stable cardiomegaly and aortic atherosclerosis status post median sternotomy and CABG. Multiple sternotomy wires are fractured as on previous studies. There  are chronic low lung volumes with chronic bibasilar atelectasis or scarring. No superimposed airspace disease or enlarging pulmonary nodule identified. There is no pleural effusion or pneumothorax. No acute osseous findings are seen. Chronic rib deformities are present on the left.  IMPRESSION: No evidence of acute cardiopulmonary process. Cardiomegaly with chronic bibasilar atelectasis or scarring. Electronically Signed   By: Richardean Sale M.D.   On: 12/25/2016 11:11     ASSESSMENT AND PLAN:   * CAP with acute hypoxic respiratory failure On IV antibiotics.  Prednisone and nebulizers added.   Wean O2 as tolerated. Home O2 set up. But still SOB and sats 88% on RA at rest. Likely d/c in AM  * Sepsis POA Resolved  * HTN Continue home meds  * Chronic diastolic congestive heart failure.  On Bumex. Repeat CXR shows no pulm edema  All the records are reviewed and case discussed with Care Management/Social Worker Management plans discussed with the patient, family and they are in agreement.  CODE STATUS: FULL CODE  DVT Prophylaxis: SCDs  TOTAL TIME TAKING CARE OF THIS PATIENT: 35 minutes.   POSSIBLE D/C IN 1-2 DAYS, DEPENDING ON CLINICAL CONDITION.  Leia Alf Lavanya Roa M.D on 12/26/2016 at 1:13 PM  Between 7am to 6pm - Pager - [phone]  After 6pm go to [email or web address] - password [email or web address] [facility]  [facility] [place] [facility]  Office  [phone]  CC: Primary care physician; Adin Hector, MD  Note: This dictation was prepared with Dragon dictation along with smaller phrase technology. Any transcriptional errors that result from this process are unintentional.

## 2016-12-26 NOTE — Progress Notes (Signed)
Patients oxygen saturations 88% on room air. Placed on 2L nasal cannula.  MD notified.

## 2016-12-26 NOTE — Plan of Care (Signed)
Patient 02 sats 88% on room air at rest.

## 2016-12-27 LAB — CBC
HCT: 42 % (ref 40.0–52.0)
Hemoglobin: 14 g/dL (ref 13.0–18.0)
MCH: 34.4 pg — AB (ref 26.0–34.0)
MCHC: 33.3 g/dL (ref 32.0–36.0)
MCV: 103.3 fL — AB (ref 80.0–100.0)
PLATELETS: 181 10*3/uL (ref 150–440)
RBC: 4.06 MIL/uL — AB (ref 4.40–5.90)
RDW: 17 % — ABNORMAL HIGH (ref 11.5–14.5)
WBC: 11.2 10*3/uL — ABNORMAL HIGH (ref 3.8–10.6)

## 2016-12-27 LAB — CREATININE, SERUM
CREATININE: 1.23 mg/dL (ref 0.61–1.24)
GFR calc Af Amer: 59 mL/min — ABNORMAL LOW (ref >60)
GFR, EST NON AFRICAN AMERICAN: 51 mL/min — AB (ref >60)

## 2016-12-27 MED ORDER — FLEET ENEMA 7-19 GM/118ML RE ENEM
1.0000 | ENEMA | RECTAL | Status: AC
  Administered 2016-12-27: 1 via RECTAL

## 2016-12-27 MED ORDER — PREDNISONE 10 MG (21) PO TBPK: ORAL_TABLET | ORAL | 0 refills | Status: DC

## 2016-12-27 MED ORDER — LEVOFLOXACIN 250 MG PO TABS: 250.0000 mg | ORAL_TABLET | Freq: Every day | ORAL | 0 refills | Status: DC

## 2016-12-27 MED ORDER — POLYETHYLENE GLYCOL 3350 17 G PO PACK
17.0000 g | PACK | ORAL | Status: AC
  Administered 2016-12-27: 17 g via ORAL
  Filled 2016-12-27: qty 1

## 2016-12-27 MED ORDER — BISACODYL 5 MG PO TBEC
10.0000 mg | DELAYED_RELEASE_TABLET | ORAL | Status: AC
  Administered 2016-12-27: 10 mg via ORAL
  Filled 2016-12-27: qty 2

## 2016-12-27 MED ORDER — SENNOSIDES-DOCUSATE SODIUM 8.6-50 MG PO TABS
1.0000 | ORAL_TABLET | ORAL | Status: AC
  Administered 2016-12-27: 1 via ORAL
  Filled 2016-12-27: qty 1

## 2016-12-27 MED ORDER — AZITHROMYCIN 250 MG PO TABS: 500.0000 mg | ORAL_TABLET | Freq: Every day | ORAL | Status: DC

## 2016-12-27 NOTE — Discharge Summary (Signed)
[facility] NAME: Norman Morales    MR#:  [id number]  DATE OF BIRTH:  1926-12-07  DATE OF ADMISSION:  12/23/2016   ADMITTING PHYSICIAN: Lance Coon, MD  DATE OF DISCHARGE: 12/27/2016  1:12 PM  PRIMARY CARE PHYSICIAN: Adin Hector, MD   ADMISSION DIAGNOSIS:  Acute respiratory failure with hypoxemia ([place]) [J96.01] Community acquired pneumonia of left lower lobe of lung ([place]) [J18.1] DISCHARGE DIAGNOSIS:  Principal Problem:   Sepsis ([place]) Active Problems:   Essential hypertension   CAD (coronary artery disease) of artery bypass graft   Hyperlipidemia   PAD (peripheral artery disease) ([place])   CAP (community acquired pneumonia)  SECONDARY DIAGNOSIS:   Past Medical History:  Diagnosis Date  . CHF (congestive heart failure) ([place])   . Coronary artery disease   . Hyperlipidemia   . Hypertension    HOSPITAL COURSE:  83 y.o. male admitted with cough and productive sputum for a week, but with acute onset shortness of breath that began night before admission  * CAP with acute hypoxic respiratory failure - Improved with Steroids, Nebs and Abx - Being D/Ced on Abx And Prednisone Taper  * Sepsis POA Resolved  * HTN Continue home meds  * Chronic diastolic congestive heart failure.  On Bumex. Well Compensated at this time. DISCHARGE CONDITIONS:  Stable CONSULTS OBTAINED:   DRUG ALLERGIES:  No Known Allergies DISCHARGE MEDICATIONS:   Allergies as of 12/27/2016   No Known Allergies     Medication List    TAKE these medications   albuterol 108 (90 Base) MCG/ACT inhaler Commonly known as:  PROVENTIL HFA;VENTOLIN HFA Inhale 2 puffs into the lungs every 4 (four) hours as needed for wheezing or shortness of breath. What changed:    how much to take  when to take this   allopurinol 100 MG tablet Commonly known as:  ZYLOPRIM TAKE 2 TABLETS BY MOUTH EVERY DAY AS DIRECTED   ANORO ELLIPTA 62.5-25 MCG/INH  Aepb Generic drug:  umeclidinium-vilanterol Inhale 1 puff into the lungs daily.   aspirin EC 81 MG tablet Take 81 mg by mouth at bedtime.   atorvastatin 80 MG tablet Commonly known as:  LIPITOR TAKE ONE TABLET AT BEDTIME   azelastine 0.1 % nasal spray Commonly known as:  ASTELIN Place 1-2 sprays into the nose 2 (two) times daily as needed for rhinitis.   B-12 1000 MCG/ML Kit Inject 1,000 mcg as directed every 30 (thirty) days.   bumetanide 2 MG tablet Commonly known as:  BUMEX Take 52ms daily in the morning   CoQ-10 200 MG Caps Take 200 mg by mouth every evening.   ELIQUIS 5 MG Tabs tablet Generic drug:  apixaban Take 5 mg by mouth 2 (two) times daily.   gabapentin 300 MG capsule Commonly known as:  NEURONTIN Take 300 mg by mouth 2 (two) times daily.   guaiFENesin-dextromethorphan 100-10 MG/5ML syrup Commonly known as:  ROBITUSSIN DM Take 5 mLs by mouth every 4 (four) hours as needed for cough.   ibuprofen 200 MG tablet Commonly known as:  ADVIL,MOTRIN Take 200 mg by mouth every 6 (six) hours as needed for mild pain.   levofloxacin 250 MG tablet Commonly known as:  LEVAQUIN Take 1 tablet (250 mg total) by mouth daily.   Melatonin 1 MG Tabs Take 1 mg by mouth daily as needed (sleep).   metoprolol tartrate 25 MG tablet Commonly known as:  LOPRESSOR Take 50 mg by mouth  2 (two) times daily.   predniSONE 10 MG (21) Tbpk tablet Commonly known as:  STERAPRED UNI-PAK 21 TAB 36m day 1 and taper 10 mg daily until done   spironolactone 25 MG tablet Commonly known as:  ALDACTONE Take 25 mg by mouth daily.   zolpidem 5 MG tablet Commonly known as:  AMBIEN Take 1 tablet (5 mg total) by mouth at bedtime as needed for sleep.        DISCHARGE INSTRUCTIONS:   DIET:  Regular diet DISCHARGE CONDITION:  Good ACTIVITY:  Activity as tolerated OXYGEN:  Home Oxygen: Yes.    Oxygen Delivery: 2 liters/min via Patient connected to nasal cannula oxygen DISCHARGE  LOCATION:  home with Home Health  If you experience worsening of your admission symptoms, develop shortness of breath, life threatening emergency, suicidal or homicidal thoughts you must seek medical attention immediately by calling 911 or calling your MD immediately  if symptoms less severe.  You Must read complete instructions/literature along with all the possible adverse reactions/side effects for all the Medicines you take and that have been prescribed to you. Take any new Medicines after you have completely understood and accpet all the possible adverse reactions/side effects.   Please note  You were cared for by a hospitalist during your hospital stay. If you have any questions about your discharge medications or the care you received while you were in the hospital after you are discharged, you can call the unit and asked to speak with the hospitalist on call if the hospitalist that took care of you is not available. Once you are discharged, your primary care physician will handle any further medical issues. Please note that NO REFILLS for any discharge medications will be authorized once you are discharged, as it is imperative that you return to your primary care physician (or establish a relationship with a primary care physician if you do not have one) for your aftercare needs so that they can reassess your need for medications and monitor your lab values.    On the day of Discharge:  VITAL SIGNS:  Blood pressure (!) 152/74, pulse 69, temperature (!) 97.4 F (36.3 C), temperature source Oral, resp. rate 16, height 6' (1.829 m), weight 120 kg (264 lb 8 oz), SpO2 96 %. PHYSICAL EXAMINATION:  GENERAL:  83y.o.-year-old patient lying in the bed with no acute distress.  EYES: Pupils equal, round, reactive to light and accommodation. No scleral icterus. Extraocular muscles intact.  HEENT: Head atraumatic, normocephalic. Oropharynx and nasopharynx clear.  NECK:  Supple, no jugular venous  distention. No thyroid enlargement, no tenderness.  LUNGS: Normal breath sounds bilaterally, no wheezing, rales,rhonchi or crepitation. No use of accessory muscles of respiration.  CARDIOVASCULAR: S1, S2 normal. No murmurs, rubs, or gallops.  ABDOMEN: Soft, non-tender, non-distended. Bowel sounds present. No organomegaly or mass.  EXTREMITIES: No pedal edema, cyanosis, or clubbing.  NEUROLOGIC: Cranial nerves II through XII are intact. Muscle strength 5/5 in all extremities. Sensation intact. Gait not checked.  PSYCHIATRIC: The patient is alert and oriented x 3.  SKIN: No obvious rash, lesion, or ulcer.  DATA REVIEW:   CBC Recent Labs  Lab 12/27/16 0405  WBC 11.2*  HGB 14.0  HCT 42.0  PLT 181    Chemistries  Recent Labs  Lab 12/23/16 0405  12/25/16 0438 12/27/16 0405  NA 138   < > 134*  --   K 4.7   < > 4.6  --   CL 103   < >  97*  --   CO2 24   < > 27  --   GLUCOSE 144*   < > 139*  --   BUN 37*   < > 63*  --   CREATININE 1.00   < > 1.52* 1.23  CALCIUM 9.3   < > 8.8*  --   MG 2.1  --   --   --   AST 25  --   --   --   ALT 15*  --   --   --   ALKPHOS 92  --   --   --   BILITOT 1.2  --   --   --    < > = values in this interval not displayed.     Follow-up Information    Tama High III, MD Follow up in 1 week(s).   Specialty:  Internal Medicine Contact information: [address] [place] [address] [phone]           Management plans discussed with the patient, nursing and they are in agreement.  CODE STATUS: Prior   TOTAL TIME TAKING CARE OF THIS PATIENT: 45 minutes.    Max Sane M.D on 12/27/2016 at 5:41 PM  Between 7am to 6pm - Pager - [phone]  After 6pm go to [email or web address] - password [email or web address] [facility]  [employer] [facility]  Office  [phone]  CC: Primary care physician; Adin Hector, MD   Note: This dictation was prepared with Dragon dictation along with smaller phrase technology.  Any transcriptional errors that result from this process are unintentional.

## 2016-12-27 NOTE — Care Management Note (Signed)
Case Management Note  Patient Details  Name: Norman Morales MRN: [id number] Date of Birth: Oct 08, 1926  Subjective/Objective:        Mr Manor received a portable 02 tank from [facility] on Friday 12/25/16 which is at bedside in his [facility] hospital room. [facility] will deliver a new home 02 set up to Mr Tsuda residence today after he is discharged to home. Mr Doenges has made comments about not wanting home health but his wife has requested a HH=RN. [facility] will call the family tomorrow and offer home health services and family can accept or decline them after they see how well Mr Klich is functioning at home over night.            Action/Plan:   Expected Discharge Date:  12/27/16               Expected Discharge Plan:     In-House Referral:     Discharge planning Services  CM Consult  Post Acute Care Choice:  Durable Medical Equipment Choice offered to:  Patient  DME Arranged:    DME Agency:  [facility] Arranged:  Patient Refused [place] Agency:     Status of Service:  Completed, signed off  If discussed at [place] of Stay Meetings, dates discussed:    Additional Comments:  Lupie Sawa A, RN 12/27/2016, 12:10 PM

## 2016-12-27 NOTE — Plan of Care (Signed)
Na

## 2016-12-27 NOTE — Progress Notes (Signed)
IVs and tele removed from patient. Discharge instructions given to patient and wife. Verbalized understanding. Home oxygen is in room and set up. Wife to transport patient home.

## 2016-12-27 NOTE — Progress Notes (Signed)
Pharmacy Antibiotic Note  Norman Morales is a 83 y.o. male admitted on 12/23/2016 with pneumonia.  Pharmacy has been consulted for azithromycin/ceftriaxone dosing.  This is day #5 of antibiotics. Recommend discontinuing azithromycin.  Plan: Converted azithromycin to PO. Continue 500 mg daily dose. Continue ceftriaxone 1 g IV daily  Height: 6' (182.9 cm) Weight: 264 lb 8 oz (120 kg) IBW/kg (Calculated) : 77.6  Temp (24hrs), Avg:97.8 F (36.6 C), Min:97.4 F (36.3 C), Max:98 F (36.7 C)  Recent Labs  Lab 12/23/16 0405 12/23/16 0450 12/23/16 0820 12/24/16 0308 12/25/16 0438 12/27/16 0405  WBC 15.6*  --   --  18.8* 15.2* 11.2*  CREATININE 1.00  --   --  1.39* 1.52* 1.23  LATICACIDVEN  --  2.6* 2.7*  --   --   --     Estimated Creatinine Clearance: 55.5 mL/min (by C-G formula based on SCr of 1.23 mg/dL).    No Known Allergies  Antimicrobials this admission: azith 11/21 >>    Ceftriaxone 11/21 >>    Dose adjustments this admission:  Microbiology results: 11/21 BCx: No growth 4 days  Thank you for allowing pharmacy to be a part of this patient's care.  Lenis Noon, PharmD, BCPS Clinical Pharmacist 12/27/2016 11:47 AM

## 2016-12-27 NOTE — Progress Notes (Signed)
Patient ambulated around nurses desk three times without any complications. Patient has no signs or symptoms of respiratory distress. Patient was administered ambien at 2230, but was awaken most of the shift. Patient deferred not to have enema given this shift.

## 2016-12-28 LAB — CULTURE, BLOOD (ROUTINE X 2)
Culture: NO GROWTH
Culture: NO GROWTH

## 2017-03-29 ENCOUNTER — Ambulatory Visit (INDEPENDENT_AMBULATORY_CARE_PROVIDER_SITE_OTHER): Payer: Medicare Other | Admitting: Pulmonary Disease

## 2017-03-29 ENCOUNTER — Encounter: Payer: Self-pay | Admitting: Pulmonary Disease

## 2017-03-29 VITALS — BP 158/88 | HR 71 | Ht 72.0 in | Wt 266.0 lb

## 2017-03-29 DIAGNOSIS — Z87891 Personal history of nicotine dependence: Secondary | ICD-10-CM

## 2017-03-29 DIAGNOSIS — R5383 Other fatigue: Secondary | ICD-10-CM | POA: Diagnosis not present

## 2017-03-29 DIAGNOSIS — R0609 Other forms of dyspnea: Secondary | ICD-10-CM | POA: Diagnosis not present

## 2017-03-29 DIAGNOSIS — I482 Chronic atrial fibrillation, unspecified: Secondary | ICD-10-CM

## 2017-03-29 DIAGNOSIS — I509 Heart failure, unspecified: Secondary | ICD-10-CM | POA: Diagnosis not present

## 2017-03-29 DIAGNOSIS — G4733 Obstructive sleep apnea (adult) (pediatric): Secondary | ICD-10-CM | POA: Diagnosis not present

## 2017-03-29 DIAGNOSIS — R062 Wheezing: Secondary | ICD-10-CM

## 2017-03-29 NOTE — Patient Instructions (Addendum)
Continue Anoro inhaler Trial of our Arnuity inhaler -1 inhalation daily.  Rinse mouth after use.  Samples provided If you feel like the addition of the Arnuity nhaler has been beneficial, we can add this as a prescription Overnight oximetry on CPAP and room air 6-minute walk test on room air We discussed the importance of weight loss Follow-up in 4-6 weeks

## 2017-03-30 NOTE — Progress Notes (Signed)
PULMONARY CONSULT NOTE  Requesting MD/Service: Norman Morales Date of initial consultation: 03/29/17 Reason for consultation: DOE  PT PROFILE: 84 y.o. male remote smoker referred for progressive exertional dyspnea, now moderate to severe.  History of pneumonia November 2018 after which he was discharged to home with oxygen therapy.  DATA: CTA chest 06/23/13: Significant increase in size of the right upper lobe pulmonary nodule and of the superior segment right lower lobe pulmonary nodule, although this has occurred over an extended period of time, 8 years. 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 Prior CABG with sternal separation. Four chamber cardiomegaly. Mildly enlarged right hilar and infrahilar adenopathy Echocardiogram 11/27/15: Technically difficult study due to chest wall/lung interference.  Mild left ventricular hypertrophy.  LVEF 60-65%.  Mildly dilated left atrium.  Normal right ventricular systolic function.   HPI:  As above.  He reports several years of progressive exertional dyspnea.  At the present time he is limited to approximately 35 m before he has to rest.  He has difficulty discerning the difference between fatigue, weakness and shortness of breath.  He denies cough and sputum production.  He denies chest pain and palpitations.  He does have orthopnea.  He smoked very remotely quitting almost 60 years ago.  After his hospitalization in November, he was discharged home with oxygen but he does not wear this.  He has a home pulse oximeter and frequently checks his oxygen saturations.  On most occasions it is greater than 90%.  Very occasionally, might be in the high 80s.  He denies CP, fever, purulent sputum, hemoptysis, LE edema and calf tenderness.   He is maintained on Anoro inhaler but is unable to discern much benefit from this medication.  He is a little bit vague about how consistently he uses it.   Past Medical History:   Diagnosis Date  . CHF (congestive heart failure) ([place])   . Coronary artery disease   . Hyperlipidemia   . Hypertension     Past Surgical History:  Procedure Laterality Date  . CARDIAC SURGERY    . CATARACT EXTRACTION, BILATERAL    . EXPLORATION POST OPERATIVE OPEN HEART    . LUMBAR FUSION    . PERIPHERAL VASCULAR CATHETERIZATION Left 12/30/2015   Procedure: Lower Extremity Angiography;  Surgeon: Algernon Huxley, MD;  Location: [facility];  Service: Cardiovascular;  Laterality: Left;  . PERIPHERAL VASCULAR CATHETERIZATION  12/30/2015   Procedure: Lower Extremity Intervention;  Surgeon: Algernon Huxley, MD;  Location: [facility];  Service: Cardiovascular;;  . REPLACEMENT TOTAL KNEE BILATERAL      MEDICATIONS: I have reviewed all medications and confirmed regimen as documented  Social History   Socioeconomic History  . Marital status: Married    Spouse name: Not on file  . Number of children: Not on file  . Years of education: Not on file  . Highest education level: Not on file  Social Needs  . Financial resource strain: Not on file  . Food insecurity - worry: Not on file  . Food insecurity - inability: Not on file  . Transportation needs - medical: Not on file  . Transportation needs - non-medical: Not on file  Occupational History  . Not on file  Tobacco Use  . Smoking status: Former Smoker    Packs/day: 1.00    Years: 42.00    Pack years: 42.00    Types: Cigarettes  . Smokeless tobacco: Never Used  . Tobacco comment: quit  42 years ago  Substance and Sexual Activity  . Alcohol use: Yes    Comment: occasionally  . Drug use: No  . Sexual activity: Not on file  Other Topics Concern  . Not on file  Social History Narrative  . Not on file    Family History  Problem Relation Age of Onset  . Aortic aneurysm Mother     ROS: No fever, myalgias/arthralgias, unexplained weight loss or weight gain No new focal weakness or sensory deficits No otalgia,  hearing loss, visual changes, nasal and sinus symptoms, mouth and throat problems No neck pain or adenopathy No abdominal pain, N/V/D, diarrhea, change in bowel pattern No dysuria, change in urinary pattern   Vitals:   03/29/17 1059 03/29/17 1107  BP:  (!) 158/88  Pulse:  71  SpO2:  95%  Weight: 120.7 kg (266 lb)   Height: 6' (1.829 m)   Room air   EXAM:  Gen: WDWN, No overt respiratory distress, moderate centripetal obesity HEENT: NCAT, sclera white, oropharynx normal Neck: Supple without LAN, thyromegaly, JVD Lungs: breath sounds are moderately diminished throughout with distant and expiratory wheezes.  Percussion note is normal. Cardiovascular: IR IR with very distant heart sounds, no murmurs noted Abdomen: Obese, soft, nontender, normal BS Ext: Severe bilateral chronic venous stasis changes without clubbing, cyanosis Neuro: CNs grossly intact, motor and sensory intact Skin: Limited exam, no lesions noted  DATA:   BMP Latest Ref Rng & Units 12/27/2016 12/25/2016 12/24/2016  Glucose 65 - 99 mg/dL - 139(H) 142(H)  BUN 6 - 20 mg/dL - 63(H) 52(H)  Creatinine 0.61 - 1.24 mg/dL 1.23 1.52(H) 1.39(H)  Sodium 135 - 145 mmol/L - 134(L) 134(L)  Potassium 3.5 - 5.1 mmol/L - 4.6 4.7  Chloride 101 - 111 mmol/L - 97(L) 97(L)  CO2 22 - 32 mmol/L - 27 27  Calcium 8.9 - 10.3 mg/dL - 8.8(L) 8.7(L)    CBC Latest Ref Rng & Units 12/27/2016 12/25/2016 12/24/2016  WBC 3.8 - 10.6 K/uL 11.2(H) 15.2(H) -  Hemoglobin 13.0 - 18.0 g/dL 14.0 13.3 14.6  Hematocrit 40.0 - 52.0 % 42.0 40.8 -  Platelets 150 - 440 K/uL 181 170 -    CXR: 12/25/16 -cardiomegaly, left basilar platelike atelectasis  IMPRESSION:     ICD-10-CM   1. DOE (dyspnea on exertion) R06.09 6 minute walk  2. Chronic congestive heart failure I50.9 Pulse oximetry, overnight  3. Chronic atrial fibrillation (HCC) I48.2   4. Severe obesity (BMI >= 40) (HCC) E66.01   5. Former smoker Z87.891   14. Wheezing R06.2   7. Fatigue,  unspecified type R53.83   8. OSA (obstructive sleep apnea) G47.33 Pulse oximetry, overnight   His shortness of breath is almost certainly multifactorial.  He has chronic atrial fibrillation with chronic congestive heart failure.  Obesity is certainly a contributor as well.  However, he does exhibit distant and expiratory wheezes on exam today suggesting there might be a component of reversible airflow obstruction.  Fatigue appears to be a significant component of his symptomatology as well.  This might represent poor sleep quality.  He has a history of obstructive sleep apnea and wears CPAP.  It is still possible that he is suffering nocturnal desaturation.  PLAN:  Continue Anoro inhaler Samples of our Arnuity inhaler provided If he feels that he has benefited from [place] inhaler, we can consolidate his inhalers to [place] Overnight oximetry on CPAP and room air 6-minute walk test on room air We discussed the importance of  weight loss and possible strategies Follow-up in 4-6 weeks or sooner as needed   Merton Border, MD PCCM service Mobile [phone] Pager [phone] 03/30/2017 3:38 PM

## 2017-03-31 ENCOUNTER — Telehealth: Payer: Self-pay | Admitting: Pulmonary Disease

## 2017-03-31 NOTE — Telephone Encounter (Signed)
Lm with spouse to call office she states he cannot make appt for smw

## 2017-04-01 ENCOUNTER — Ambulatory Visit: Payer: Medicare Other

## 2017-04-26 ENCOUNTER — Ambulatory Visit: Payer: Medicare Other

## 2017-05-05 ENCOUNTER — Ambulatory Visit: Payer: Medicare Other | Admitting: Pulmonary Disease

## 2017-05-31 ENCOUNTER — Other Ambulatory Visit: Payer: Self-pay | Admitting: Internal Medicine

## 2017-05-31 DIAGNOSIS — R140 Abdominal distension (gaseous): Secondary | ICD-10-CM

## 2017-06-01 ENCOUNTER — Ambulatory Visit
Admission: RE | Admit: 2017-06-01 | Discharge: 2017-06-01 | Disposition: A | Discharge status: Home / Self Care | Payer: Medicare Other | Source: Ambulatory Visit | Attending: Internal Medicine | Admitting: Internal Medicine

## 2017-06-01 DIAGNOSIS — R140 Abdominal distension (gaseous): Secondary | ICD-10-CM | POA: Insufficient documentation

## 2017-06-04 ENCOUNTER — Other Ambulatory Visit: Payer: Self-pay

## 2017-06-04 ENCOUNTER — Inpatient Hospital Stay
Admission: AD | Admit: 2017-06-04 | Discharge: 2017-06-04 | Disposition: A | Discharge status: Home / Self Care | Payer: Medicare Other | Source: Ambulatory Visit | Attending: Internal Medicine | Admitting: Internal Medicine

## 2017-06-04 ENCOUNTER — Inpatient Hospital Stay
Admission: AD | Admit: 2017-06-04 | Discharge: 2017-06-10 | DRG: 291 | Disposition: A | Discharge status: Home Health | Payer: Medicare Other | Source: Ambulatory Visit | Attending: Internal Medicine | Admitting: Internal Medicine

## 2017-06-04 ENCOUNTER — Encounter: Payer: Self-pay | Admitting: Internal Medicine

## 2017-06-04 DIAGNOSIS — Z87891 Personal history of nicotine dependence: Secondary | ICD-10-CM | POA: Diagnosis not present

## 2017-06-04 DIAGNOSIS — K7460 Unspecified cirrhosis of liver: Secondary | ICD-10-CM | POA: Diagnosis present

## 2017-06-04 DIAGNOSIS — I130 Hypertensive heart and chronic kidney disease with heart failure and stage 1 through stage 4 chronic kidney disease, or unspecified chronic kidney disease: Principal | ICD-10-CM | POA: Diagnosis present

## 2017-06-04 DIAGNOSIS — R040 Epistaxis: Secondary | ICD-10-CM | POA: Diagnosis not present

## 2017-06-04 DIAGNOSIS — R911 Solitary pulmonary nodule: Secondary | ICD-10-CM | POA: Diagnosis present

## 2017-06-04 DIAGNOSIS — N183 Chronic kidney disease, stage 3 (moderate): Secondary | ICD-10-CM | POA: Diagnosis present

## 2017-06-04 DIAGNOSIS — M5030 Other cervical disc degeneration, unspecified cervical region: Secondary | ICD-10-CM | POA: Diagnosis present

## 2017-06-04 DIAGNOSIS — I2510 Atherosclerotic heart disease of native coronary artery without angina pectoris: Secondary | ICD-10-CM | POA: Diagnosis present

## 2017-06-04 DIAGNOSIS — Z6836 Body mass index (BMI) 36.0-36.9, adult: Secondary | ICD-10-CM

## 2017-06-04 DIAGNOSIS — Z96653 Presence of artificial knee joint, bilateral: Secondary | ICD-10-CM | POA: Diagnosis present

## 2017-06-04 DIAGNOSIS — I509 Heart failure, unspecified: Secondary | ICD-10-CM

## 2017-06-04 DIAGNOSIS — Z7901 Long term (current) use of anticoagulants: Secondary | ICD-10-CM

## 2017-06-04 DIAGNOSIS — I5033 Acute on chronic diastolic (congestive) heart failure: Secondary | ICD-10-CM | POA: Diagnosis present

## 2017-06-04 DIAGNOSIS — E669 Obesity, unspecified: Secondary | ICD-10-CM | POA: Diagnosis present

## 2017-06-04 DIAGNOSIS — J984 Other disorders of lung: Secondary | ICD-10-CM | POA: Diagnosis present

## 2017-06-04 DIAGNOSIS — Z981 Arthrodesis status: Secondary | ICD-10-CM | POA: Diagnosis not present

## 2017-06-04 DIAGNOSIS — I5030 Unspecified diastolic (congestive) heart failure: Secondary | ICD-10-CM

## 2017-06-04 DIAGNOSIS — Z9981 Dependence on supplemental oxygen: Secondary | ICD-10-CM | POA: Diagnosis not present

## 2017-06-04 DIAGNOSIS — J9621 Acute and chronic respiratory failure with hypoxia: Secondary | ICD-10-CM | POA: Diagnosis present

## 2017-06-04 DIAGNOSIS — Z66 Do not resuscitate: Secondary | ICD-10-CM | POA: Diagnosis present

## 2017-06-04 DIAGNOSIS — Z7982 Long term (current) use of aspirin: Secondary | ICD-10-CM

## 2017-06-04 DIAGNOSIS — Z79899 Other long term (current) drug therapy: Secondary | ICD-10-CM

## 2017-06-04 DIAGNOSIS — R188 Other ascites: Secondary | ICD-10-CM

## 2017-06-04 DIAGNOSIS — J42 Unspecified chronic bronchitis: Secondary | ICD-10-CM | POA: Diagnosis present

## 2017-06-04 DIAGNOSIS — E785 Hyperlipidemia, unspecified: Secondary | ICD-10-CM | POA: Diagnosis present

## 2017-06-04 DIAGNOSIS — Z8701 Personal history of pneumonia (recurrent): Secondary | ICD-10-CM

## 2017-06-04 HISTORY — DX: Benign neoplasm of unspecified adrenal gland: D35.00

## 2017-06-04 HISTORY — DX: Solitary pulmonary nodule: R91.1

## 2017-06-04 HISTORY — DX: Other cervical disc degeneration, unspecified cervical region: M50.30

## 2017-06-04 HISTORY — DX: Other disorders of lung: J98.4

## 2017-06-04 HISTORY — DX: Unspecified atrial fibrillation: I48.91

## 2017-06-04 LAB — CBC WITH DIFFERENTIAL/PLATELET
BASOS ABS: 0 10*3/uL (ref 0–0.1)
BASOS PCT: 0 %
EOS PCT: 1 %
Eosinophils Absolute: 0.1 10*3/uL (ref 0–0.7)
HCT: 40.2 % (ref 40.0–52.0)
Hemoglobin: 13.1 g/dL (ref 13.0–18.0)
LYMPHS PCT: 9 %
Lymphs Abs: 0.9 10*3/uL — ABNORMAL LOW (ref 1.0–3.6)
MCH: 31.1 pg (ref 26.0–34.0)
MCHC: 32.5 g/dL (ref 32.0–36.0)
MCV: 95.7 fL (ref 80.0–100.0)
MONO ABS: 0.9 10*3/uL (ref 0.2–1.0)
Monocytes Relative: 9 %
Neutro Abs: 8 10*3/uL — ABNORMAL HIGH (ref 1.4–6.5)
Neutrophils Relative %: 81 %
PLATELETS: 202 10*3/uL (ref 150–440)
RBC: 4.2 MIL/uL — AB (ref 4.40–5.90)
RDW: 18.9 % — AB (ref 11.5–14.5)
WBC: 9.9 10*3/uL (ref 3.8–10.6)

## 2017-06-04 LAB — COMPREHENSIVE METABOLIC PANEL
ALT: 11 U/L — AB (ref 17–63)
AST: 22 U/L (ref 15–41)
Albumin: 3.6 g/dL (ref 3.5–5.0)
Alkaline Phosphatase: 105 U/L (ref 38–126)
Anion gap: 6 (ref 5–15)
BILIRUBIN TOTAL: 1.1 mg/dL (ref 0.3–1.2)
BUN: 46 mg/dL — ABNORMAL HIGH (ref 6–20)
CHLORIDE: 93 mmol/L — AB (ref 101–111)
CO2: 37 mmol/L — ABNORMAL HIGH (ref 22–32)
CREATININE: 1.35 mg/dL — AB (ref 0.61–1.24)
Calcium: 8.8 mg/dL — ABNORMAL LOW (ref 8.9–10.3)
GFR, EST AFRICAN AMERICAN: 52 mL/min — AB (ref >60)
GFR, EST NON AFRICAN AMERICAN: 45 mL/min — AB (ref >60)
Glucose, Bld: 127 mg/dL — ABNORMAL HIGH (ref 65–99)
POTASSIUM: 4.3 mmol/L (ref 3.5–5.1)
Sodium: 136 mmol/L (ref 135–145)
TOTAL PROTEIN: 7.3 g/dL (ref 6.5–8.1)

## 2017-06-04 LAB — BRAIN NATRIURETIC PEPTIDE: B Natriuretic Peptide: 328 pg/mL — ABNORMAL HIGH (ref 0.0–100.0)

## 2017-06-04 LAB — MAGNESIUM: Magnesium: 2.3 mg/dL (ref 1.7–2.4)

## 2017-06-04 LAB — TSH: TSH: 2.658 u[IU]/mL (ref 0.350–4.500)

## 2017-06-04 MED ORDER — ASPIRIN EC 81 MG PO TBEC
81.0000 mg | DELAYED_RELEASE_TABLET | Freq: Every day | ORAL | Status: DC
  Administered 2017-06-04: 81 mg via ORAL
  Filled 2017-06-04: qty 1

## 2017-06-04 MED ORDER — SODIUM CHLORIDE 0.9% FLUSH
3.0000 mL | Freq: Two times a day (BID) | INTRAVENOUS | Status: DC
  Administered 2017-06-04 – 2017-06-10 (×13): 3 mL via INTRAVENOUS

## 2017-06-04 MED ORDER — MELATONIN 5 MG PO TABS
2.5000 mg | ORAL_TABLET | Freq: Every evening | ORAL | Status: DC | PRN
  Administered 2017-06-07: 2.5 mg via ORAL
  Filled 2017-06-04: qty 0.5

## 2017-06-04 MED ORDER — AZELASTINE HCL 0.1 % NA SOLN: 1.0000 | Freq: Two times a day (BID) | NASAL | Status: DC | PRN

## 2017-06-04 MED ORDER — ENOXAPARIN SODIUM 40 MG/0.4ML ~~LOC~~ SOLN: 40.0000 mg | SUBCUTANEOUS | Status: DC

## 2017-06-04 MED ORDER — UMECLIDINIUM-VILANTEROL 62.5-25 MCG/INH IN AEPB
1.0000 | INHALATION_SPRAY | Freq: Every day | RESPIRATORY_TRACT | Status: DC
  Administered 2017-06-04 – 2017-06-07 (×4): 1 via RESPIRATORY_TRACT
  Filled 2017-06-04: qty 14

## 2017-06-04 MED ORDER — ACETAMINOPHEN 650 MG RE SUPP: 650.0000 mg | Freq: Four times a day (QID) | RECTAL | Status: DC | PRN

## 2017-06-04 MED ORDER — ACETAMINOPHEN 325 MG PO TABS
650.0000 mg | ORAL_TABLET | Freq: Four times a day (QID) | ORAL | Status: DC | PRN
  Administered 2017-06-04: 650 mg via ORAL
  Filled 2017-06-04: qty 2

## 2017-06-04 MED ORDER — IBUPROFEN 200 MG PO TABS
200.0000 mg | ORAL_TABLET | Freq: Four times a day (QID) | ORAL | Status: DC | PRN
Start: 2017-06-04 — End: 2017-06-10

## 2017-06-04 MED ORDER — METOPROLOL TARTRATE 50 MG PO TABS
50.0000 mg | ORAL_TABLET | Freq: Two times a day (BID) | ORAL | Status: DC
  Administered 2017-06-04 – 2017-06-10 (×12): 50 mg via ORAL
  Filled 2017-06-04 (×12): qty 1

## 2017-06-04 MED ORDER — POLYETHYLENE GLYCOL 3350 17 G PO PACK
17.0000 g | PACK | Freq: Every day | ORAL | Status: DC | PRN
  Administered 2017-06-04: 17 g via ORAL
  Filled 2017-06-04: qty 1

## 2017-06-04 MED ORDER — ATORVASTATIN CALCIUM 80 MG PO TABS
80.0000 mg | ORAL_TABLET | Freq: Every day | ORAL | Status: DC
  Administered 2017-06-04 – 2017-06-09 (×6): 80 mg via ORAL
  Filled 2017-06-04: qty 4
  Filled 2017-06-04 (×2): qty 1
  Filled 2017-06-04: qty 4
  Filled 2017-06-04 (×3): qty 1
  Filled 2017-06-04: qty 4
  Filled 2017-06-04: qty 1
  Filled 2017-06-04 (×2): qty 4
  Filled 2017-06-04: qty 1

## 2017-06-04 MED ORDER — SPIRONOLACTONE 25 MG PO TABS
25.0000 mg | ORAL_TABLET | Freq: Every day | ORAL | Status: DC
  Administered 2017-06-04 – 2017-06-10 (×7): 25 mg via ORAL
  Filled 2017-06-04 (×7): qty 1

## 2017-06-04 MED ORDER — APIXABAN 5 MG PO TABS
5.0000 mg | ORAL_TABLET | Freq: Two times a day (BID) | ORAL | Status: DC
  Administered 2017-06-04: 5 mg via ORAL
  Filled 2017-06-04: qty 1

## 2017-06-04 MED ORDER — ZOLPIDEM TARTRATE 5 MG PO TABS
5.0000 mg | ORAL_TABLET | Freq: Every evening | ORAL | Status: DC | PRN
  Administered 2017-06-04 – 2017-06-09 (×6): 5 mg via ORAL
  Filled 2017-06-04 (×6): qty 1

## 2017-06-04 MED ORDER — ONDANSETRON HCL 4 MG PO TABS: 4.0000 mg | ORAL_TABLET | Freq: Four times a day (QID) | ORAL | Status: DC | PRN

## 2017-06-04 MED ORDER — ALBUTEROL SULFATE (2.5 MG/3ML) 0.083% IN NEBU: 2.5000 mg | INHALATION_SOLUTION | RESPIRATORY_TRACT | Status: DC | PRN

## 2017-06-04 MED ORDER — COQ-10 200 MG PO CAPS: 200.0000 mg | ORAL_CAPSULE | Freq: Every evening | ORAL | Status: DC

## 2017-06-04 MED ORDER — ALLOPURINOL 100 MG PO TABS
100.0000 mg | ORAL_TABLET | Freq: Every day | ORAL | Status: DC
  Administered 2017-06-04 – 2017-06-10 (×7): 100 mg via ORAL
  Filled 2017-06-04 (×7): qty 1

## 2017-06-04 MED ORDER — ONDANSETRON HCL 4 MG/2ML IJ SOLN: 4.0000 mg | Freq: Four times a day (QID) | INTRAMUSCULAR | Status: DC | PRN

## 2017-06-04 MED ORDER — GABAPENTIN 600 MG PO TABS
600.0000 mg | ORAL_TABLET | Freq: Two times a day (BID) | ORAL | Status: DC
  Administered 2017-06-04: 600 mg via ORAL
  Filled 2017-06-04: qty 1

## 2017-06-04 MED ORDER — GABAPENTIN 300 MG PO CAPS
300.0000 mg | ORAL_CAPSULE | Freq: Two times a day (BID) | ORAL | Status: DC
  Administered 2017-06-05 – 2017-06-10 (×11): 300 mg via ORAL
  Filled 2017-06-04 (×12): qty 1

## 2017-06-04 MED ORDER — FUROSEMIDE 10 MG/ML IJ SOLN
80.0000 mg | Freq: Two times a day (BID) | INTRAMUSCULAR | Status: DC
  Administered 2017-06-04 – 2017-06-10 (×12): 80 mg via INTRAVENOUS
  Filled 2017-06-04 (×12): qty 8

## 2017-06-04 NOTE — Progress Notes (Signed)
Anticoagulation monitoring:   84 yo male ordered enoxaparin 40mg  SQ daily and apixaban 5mg  BID (patient's home dose). Per protocol will discontinue enoxaparin.   Pharmacy will continue to monitor and adjust per consult.   MLS

## 2017-06-04 NOTE — Progress Notes (Signed)
*  PRELIMINARY RESULTS* Echocardiogram 2D Echocardiogram has been performed.  [place] 06/04/2017, 9:17 PM

## 2017-06-04 NOTE — Progress Notes (Signed)
Pt in room 232. VSS. MD aware new orders received. Pt oriented to room and assessed.

## 2017-06-04 NOTE — Progress Notes (Signed)
Advance care planning  Purpose of Encounter Acute on chronic diastolic congestive heart failure, deteriorating respiratory status.  CODE STATUS discussion  Parties in Attendance Patient and healthcare power of attorney his wife at bedside  Patients Decisional capacity Alert and oriented.  Able to make own decisions  Discussed with patient regarding his deteriorating respiratory status secondary to his multiple comorbidities and recent pneumonia.  At this time he is being admitted for acute on chronic diastolic congestive heart failure and has failed outpatient treatment with increasing diuretics.  Advised that we will start IV diuretics and see how patient responds.  He understands he might never returned to his baseline.  He has noticed slow progressive decline with his overall strength and breathing.  Continues to live at home with his wife.  Wears oxygen. We discussed regarding his CODE STATUS and patient mentions that he wants to be DO NOT RESUSCITATE and not intubate. He wants aggressive care at this time for his congestive heart failure.  DNR/DNI  Time spent - 20 minutes

## 2017-06-04 NOTE — H&P (Signed)
Norman Morales at [place] NAME: Norman Morales    MR#:  [id number]  DATE OF BIRTH:  1926-05-01  DATE OF ADMISSION:  06/04/2017  PRIMARY CARE PHYSICIAN: Adin Hector, MD   REQUESTING/REFERRING PHYSICIAN: Dr. Caryl Comes  CHIEF COMPLAINT:  No chief complaint on file.  SOB, fatigue  HISTORY OF PRESENT ILLNESS:  Norman Morales  is a 84 y.o. male with a known history of diastolic CHF, Afib, CKD3, obesity here with SOB and weight gain in spite of increasing torsemide from 20 mg daily 200 mg daily.  Patient was using oxygen as needed but presently has to use it continuously throughout the day to keep his saturations over 90%.  Patient was admitted through Thanksgiving for a pneumonia.  Since then he has had significant decline. He has gained 20 pounds in the last 2 weeks.  PAST MEDICAL HISTORY:   Past Medical History:  Diagnosis Date  . A-fib ([place])   . Adrenal adenoma   . CHF (congestive heart failure) ([place])   . Coronary artery disease   . DDD (degenerative disc disease), cervical   . Hyperlipidemia   . Hypertension   . Lung nodule   . Restrictive lung disease     PAST SURGICAL HISTORY:   Past Surgical History:  Procedure Laterality Date  . CARDIAC SURGERY    . CATARACT EXTRACTION, BILATERAL    . EXPLORATION POST OPERATIVE OPEN HEART    . LUMBAR FUSION    . PERIPHERAL VASCULAR CATHETERIZATION Left 12/30/2015   Procedure: Lower Extremity Angiography;  Surgeon: Algernon Huxley, MD;  Location: [facility];  Service: Cardiovascular;  Laterality: Left;  . PERIPHERAL VASCULAR CATHETERIZATION  12/30/2015   Procedure: Lower Extremity Intervention;  Surgeon: Algernon Huxley, MD;  Location: [facility];  Service: Cardiovascular;;  . REPLACEMENT TOTAL KNEE BILATERAL      SOCIAL HISTORY:   Social History   Tobacco Use  . Smoking status: Former Smoker    Packs/day: 1.00    Years: 42.00    Pack years: 42.00    Types: Cigarettes  .  Smokeless tobacco: Never Used  . Tobacco comment: quit 42 years ago  Substance Use Topics  . Alcohol use: Yes    Comment: occasionally    FAMILY HISTORY:   Family History  Problem Relation Age of Onset  . Aortic aneurysm Mother     DRUG ALLERGIES:  No Known Allergies  REVIEW OF SYSTEMS:   Review of Systems  Constitutional: Positive for malaise/fatigue. Negative for chills and fever.  HENT: Negative for sore throat.   Eyes: Negative for blurred vision, double vision and pain.  Respiratory: Positive for cough and shortness of breath. Negative for hemoptysis and wheezing.   Cardiovascular: Positive for leg swelling. Negative for chest pain, palpitations and orthopnea.  Gastrointestinal: Negative for abdominal pain, constipation, diarrhea, heartburn, nausea and vomiting.  Genitourinary: Negative for dysuria and hematuria.  Musculoskeletal: Negative for back pain and joint pain.  Skin: Negative for rash.  Neurological: Negative for sensory change, speech change, focal weakness and headaches.  Endo/Heme/Allergies: Does not bruise/bleed easily.  Psychiatric/Behavioral: Negative for depression. The patient is not nervous/anxious.     MEDICATIONS AT HOME:   Prior to Admission medications   Medication Sig Start Date End Date Taking? Authorizing Provider  albuterol (PROVENTIL HFA;VENTOLIN HFA) 108 (90 Base) MCG/ACT inhaler Inhale 2 puffs into the lungs every 4 (four) hours as needed for wheezing or shortness of breath.  12/25/16   Hillary Bow, MD  allopurinol (ZYLOPRIM) 100 MG tablet TAKE 2 TABLETS BY MOUTH EVERY DAY AS DIRECTED 07/15/15   [provider]  aspirin EC 81 MG tablet Take 81 mg by mouth at bedtime.     [provider]  atorvastatin (LIPITOR) 80 MG tablet TAKE ONE TABLET AT BEDTIME 04/15/15   [provider]  azelastine (ASTELIN) 0.1 % nasal spray Place 1-2 sprays into the nose 2 (two) times daily as needed for rhinitis.     [provider]  bumetanide (BUMEX) 2 MG tablet Take 31ms daily in the morning 12/03/15   [provider]  Coenzyme Q10 (COQ-10) 200 MG CAPS Take 200 mg by mouth every evening.    [provider]  Cyanocobalamin (B-12) 1000 MCG/ML KIT Inject 1,000 mcg as directed every 30 (thirty) days.    [provider]  ELIQUIS 5 MG TABS tablet Take 5 mg by mouth 2 (two) times daily.  12/16/15   [provider]  gabapentin (NEURONTIN) 300 MG capsule Take 300 mg by mouth 2 (two) times daily. 12/14/16   [provider]  ibuprofen (ADVIL,MOTRIN) 200 MG tablet Take 200 mg by mouth every 6 (six) hours as needed for mild pain.    [provider]  Melatonin 1 MG TABS Take 1 mg by mouth daily as needed (sleep).    [provider]  metoprolol tartrate (LOPRESSOR) 25 MG tablet Take 50 mg by mouth 2 (two) times daily.  12/16/15   [provider]  spironolactone (ALDACTONE) 25 MG tablet Take 25 mg by mouth daily.  12/16/15   [provider]  umeclidinium-vilanterol (ANORO ELLIPTA) 62.5-25 MCG/INH AEPB Inhale 1 puff into the lungs daily.  05/16/14   [provider]  zolpidem (AMBIEN) 5 MG tablet Take 1 tablet (5 mg total) by mouth at bedtime as needed for sleep. 12/25/16   SHillary Bow MD     VITAL SIGNS:  Blood pressure (!) 148/70, pulse 70, temperature 97.6 F (36.4 C), temperature source Oral, resp. rate 20, SpO2 90 %.  PHYSICAL EXAMINATION:  Physical Exam  GENERAL:  84y.o.-year-old patient lying in the bed with no acute distress.  EYES: Pupils equal, round, reactive to light and accommodation. No scleral icterus. Extraocular muscles intact.  HEENT: Head atraumatic, normocephalic. Oropharynx and nasopharynx clear. No oropharyngeal erythema, moist oral mucosa. NECK:  Supple, no jugular venous distention. No thyroid enlargement, no tenderness. LUNGS: Bibasilar crackles. CARDIOVASCULAR: S1, S2 normal. No murmurs, rubs, or gallops.   ABDOMEN: Soft, nontender, nondistended. Bowel sounds present. No organomegaly or mass.  EXTREMITIES: No  cyanosis, or clubbing. + 2 pedal & radial pulses b/l.  B/L LE edema NEUROLOGIC: Cranial nerves II through XII are intact. No focal Motor or sensory deficits appreciated b/l. PSYCHIATRIC: The patient is alert and oriented x 3. Good affect.  SKIN: No obvious rash, lesion, or ulcer.   LABORATORY PANEL:   CBC Recent Labs  Lab 06/04/17 1317  WBC 9.9  HGB 13.1  HCT 40.2  PLT 202   ------------------------------------------------------------------------------------------------------------------  Chemistries  No results for input(s): NA, K, CL, CO2, GLUCOSE, BUN, CREATININE, CALCIUM, MG, AST, ALT, ALKPHOS, BILITOT in the last 168 hours.  Invalid input(s): GFRCGP ------------------------------------------------------------------------------------------------------------------  Cardiac Enzymes No results for input(s): TROPONINI in the last 168 hours. ------------------------------------------------------------------------------------------------------------------  RADIOLOGY:  No results found.   IMPRESSION AND PLAN:   * Acute on chronic diastolic chf - IV Lasix, Beta blockers - Input and Output - Counseled  to limit fluids and Salt - Monitor Bun/Cr and Potassium - Echo - ordered -Cardiology follow up after discharge  * Acute on chronic resp failure due to chf Check CXR. Wean O2 as tolerated.  * CKD 3 Monitor while being diuresed  * HTN Continue home medications  * Afib Continue home meds and anti coagulation  All the records are reviewed and case discussed with ED provider. Management plans discussed with the patient, family and they are in agreement.  CODE STATUS:  DNR  TOTAL TIME TAKING CARE OF THIS PATIENT: 40 minutes.   Leia Alf Jennife Zaucha M.D on 06/04/2017 at 1:53 PM  Between 7am to 6pm - Pager - [phone]  After 6pm go to [email or web address] - password [email or web address]  [facility]  [facility]  Office  [phone]  CC: Primary care physician; Adin Hector, MD  Note: This dictation was prepared with Dragon dictation along with smaller phrase technology. Any transcriptional errors that result from this process are unintentional.

## 2017-06-04 NOTE — Progress Notes (Signed)

## 2017-06-05 ENCOUNTER — Inpatient Hospital Stay: Payer: Medicare Other

## 2017-06-05 LAB — ECHOCARDIOGRAM COMPLETE
HEIGHTINCHES: 73 in
WEIGHTICAEL: 4401.6 [oz_av]

## 2017-06-05 LAB — BASIC METABOLIC PANEL
Anion gap: 7 (ref 5–15)
BUN: 50 mg/dL — AB (ref 6–20)
CALCIUM: 8.6 mg/dL — AB (ref 8.9–10.3)
CO2: 35 mmol/L — AB (ref 22–32)
Chloride: 94 mmol/L — ABNORMAL LOW (ref 101–111)
Creatinine, Ser: 1.46 mg/dL — ABNORMAL HIGH (ref 0.61–1.24)
GFR calc non Af Amer: 41 mL/min — ABNORMAL LOW (ref >60)
GFR, EST AFRICAN AMERICAN: 47 mL/min — AB (ref >60)
Glucose, Bld: 123 mg/dL — ABNORMAL HIGH (ref 65–99)
Potassium: 4.2 mmol/L (ref 3.5–5.1)
Sodium: 136 mmol/L (ref 135–145)

## 2017-06-05 LAB — PROTIME-INR
INR: 1.29
PROTHROMBIN TIME: 16 s — AB (ref 11.4–15.2)

## 2017-06-05 LAB — CBC
HCT: 38 % — ABNORMAL LOW (ref 40.0–52.0)
Hemoglobin: 12.3 g/dL — ABNORMAL LOW (ref 13.0–18.0)
MCH: 31.1 pg (ref 26.0–34.0)
MCHC: 32.5 g/dL (ref 32.0–36.0)
MCV: 95.8 fL (ref 80.0–100.0)
PLATELETS: 191 10*3/uL (ref 150–440)
RBC: 3.97 MIL/uL — ABNORMAL LOW (ref 4.40–5.90)
RDW: 18.5 % — ABNORMAL HIGH (ref 11.5–14.5)
WBC: 8.2 10*3/uL (ref 3.8–10.6)

## 2017-06-05 MED ORDER — NYSTATIN 100000 UNIT/GM EX POWD
Freq: Two times a day (BID) | CUTANEOUS | Status: DC
  Administered 2017-06-05 – 2017-06-10 (×11): via TOPICAL
  Filled 2017-06-05 (×2): qty 15

## 2017-06-05 MED ORDER — BACITRACIN ZINC 500 UNIT/GM EX OINT
1.0000 "application " | TOPICAL_OINTMENT | Freq: Three times a day (TID) | CUTANEOUS | Status: DC
  Administered 2017-06-05 – 2017-06-09 (×12): 1 via TOPICAL
  Filled 2017-06-05 (×4): qty 0.9

## 2017-06-05 NOTE — Progress Notes (Signed)
Patient's nose has started bleeding again- called and made aware by nurse tech- Eliquis and ASA has already been held this morning. MD, Tressia Miners paged- patient packed his own nosed, on non-rebreather as unable to tolerate nasal cannula at this moment. Awaiting further instructions from MD

## 2017-06-05 NOTE — Progress Notes (Signed)
[facility] [place] NAME: Norman Morales    MR#:  [id number]  DATE OF BIRTH:  07-22-1926  SUBJECTIVE:  CHIEF COMPLAINT:  No chief complaint on file.  -Admitted with increased weight gain.  Significant lower extremity swelling and abdominal swelling. -Epistaxis this morning  REVIEW OF SYSTEMS:  Review of Systems  Constitutional: Positive for malaise/fatigue. Negative for chills and fever.  HENT: Positive for nosebleeds. Negative for hearing loss.   Respiratory: Positive for cough and shortness of breath. Negative for wheezing.   Cardiovascular: Positive for leg swelling. Negative for chest pain and palpitations.  Gastrointestinal: Positive for abdominal pain. Negative for constipation, diarrhea, nausea and vomiting.  Genitourinary: Negative for dysuria.  Musculoskeletal: Negative for myalgias.  Neurological: Negative for dizziness, focal weakness, seizures, weakness and headaches.  Psychiatric/Behavioral: Negative for depression.    DRUG ALLERGIES:  No Known Allergies  VITALS:  Blood pressure 139/89, pulse 78, temperature 98.1 F (36.7 C), temperature source Oral, resp. rate 14, height 6\' 1"  (1.854 m), weight 125 kg (275 lb 9.6 oz), SpO2 96 %.  PHYSICAL EXAMINATION:  Physical Exam  GENERAL:  84 y.o.-year-old patient lying in the bed with no acute distress.  EYES: Pupils equal, round, reactive to light and accommodation. No scleral icterus. Extraocular muscles intact.  HEENT: Head atraumatic, normocephalic. Oropharynx  clear.  Left nare packing NECK:  Supple, no jugular venous distention. No thyroid enlargement, no tenderness.  LUNGS: Normal breath sounds bilaterally, no wheezing, rhonchi or crepitation.  Fine bibasilar crackles heard no use of accessory muscles of respiration.  CARDIOVASCULAR: S1, S2 normal. No  rubs, or gallops. 2/6 systolic murmur present ABDOMEN: Soft, nontender, very distended. Bowel sounds present. No  organomegaly or mass.  EXTREMITIES: No  cyanosis, or clubbing.  3+ type pitting edema of both lower extremities with blisters. NEUROLOGIC: Cranial nerves II through XII are intact. Muscle strength 5/5 in all extremities. Sensation intact. Gait not checked.  PSYCHIATRIC: The patient is alert and oriented x 3.  SKIN: No obvious rash, lesion, or ulcer.  Under the breasts moist areas noted   LABORATORY PANEL:   CBC Recent Labs  Lab 06/05/17 0441  WBC 8.2  HGB 12.3*  HCT 38.0*  PLT 191   ------------------------------------------------------------------------------------------------------------------  Chemistries  Recent Labs  Lab 06/04/17 1317 06/05/17 0441  NA 136 136  K 4.3 4.2  CL 93* 94*  CO2 37* 35*  GLUCOSE 127* 123*  BUN 46* 50*  CREATININE 1.35* 1.46*  CALCIUM 8.8* 8.6*  MG 2.3  --   AST 22  --   ALT 11*  --   ALKPHOS 105  --   BILITOT 1.1  --    ------------------------------------------------------------------------------------------------------------------  Cardiac Enzymes No results for input(s): TROPONINI in the last 168 hours. ------------------------------------------------------------------------------------------------------------------  RADIOLOGY:  No results found.  EKG:  No orders found for this or any previous visit.  ASSESSMENT AND PLAN:   84 y/o male with past medical history significant for diastolic CHF, A. fib, CKD stage III presents to hospital secondary to worsening weight gain and anasarca and respiratory distress.  1. Acute hypoxic respiratory failure-secondary to acute on chronic diastolic heart failure. -Patient was using 2 L oxygen as needed at home, increased up to 3 and 4 L prior to admission and continuously using it. -Chest x-ray with pulmonary edema.  Torsemide was recently increased as outpatient with not much benefit. -IV Lasix twice daily. -Continue supplemental oxygen for now. -Daily weights, strict input and  output  monitoring  2. Epistaxis-has epistaxis episodes which are spontaneously resolved at home. -Hold Eliquis and aspirin for now -Temporary packing done.  If continues to bleed, will get ENT consult  3. Anasarca- on IV lasix for now - leg edema- unna wraps after wound care consult US abdomen for ascites   4. CKD stage 3- stable for now, monitor while on IV lasix  5. Afib- rate controlled, on metoprolol Held eliquis for now due to epistaxis  6. HTN- on lasix, metoprolol, aldactone  7. DVT Prophylaxis- eliquis held for now  PT consult may be tomorrow   All the records are reviewed and case discussed with Care Management/Social Workerr. Management plans discussed with the patient, family and they are in agreement.  CODE STATUS: DNR  TOTAL TIME TAKING CARE OF THIS PATIENT: 38 minutes.   POSSIBLE D/C IN 3 DAYS, DEPENDING ON CLINICAL CONDITION.   Gladstone Lighter M.D on 06/05/2017 at 9:48 AM  Between 7am to 6pm - Pager - [phone]  After 6pm go to [email or web address] - password [email or web address] [facility]  Office  [phone]  CC: Primary care physician; Adin Hector, MD

## 2017-06-05 NOTE — Progress Notes (Signed)
Patient had a small nosebleed, that has stopped when nurse entered there room- patient states that he has these a lot at home- MD, Dr. Tressia Miners made aware, labs ordered.

## 2017-06-05 NOTE — Consult Note (Signed)
Norman Morales, Norman Morales [id number] 03/06/1926 Norman Nearing, MD  Reason for Consult: Epistaxis Requesting Physician: Gladstone Lighter, MD Consulting Physician: Norman Morales  HPI: This 84 y.o. year old male was admitted on 06/04/2017 for Acute on Chronic Respiratory Failure CHF.  Patient has had intermittent mild to moderate epistaxis from the left side of the nose at home for a while.  He is usually able to control this with self packing or holding pressure.  Currently not actively bleeding but has some packing he put in his own nose to stop the most recent bleeding.  He is on Eliquis and has a history of hypertension with some slight pressure elevation during this admission.  He is also being administered oxygen.  Allergies: No Known Allergies  Medications:  Medications Prior to Admission  Medication Sig Dispense Refill  . albuterol (PROVENTIL HFA;VENTOLIN HFA) 108 (90 Base) MCG/ACT inhaler Inhale 2 puffs into the lungs every 4 (four) hours as needed for wheezing or shortness of breath. 1 Inhaler 1  . allopurinol (ZYLOPRIM) 100 MG tablet TAKE 2 TABLETS BY MOUTH EVERY DAY AS DIRECTED    . aspirin EC 81 MG tablet Take 81 mg by mouth at bedtime.     Marland Kitchen atorvastatin (LIPITOR) 80 MG tablet TAKE ONE TABLET AT BEDTIME    . azelastine (ASTELIN) 0.1 % nasal spray Place 1-2 sprays into the nose 2 (two) times daily as needed for rhinitis.     . bumetanide (BUMEX) 2 MG tablet Take 32ms daily in the morning    . Coenzyme Q10 (COQ-10) 200 MG CAPS Take 200 mg by mouth every evening.    . Cyanocobalamin (B-12) 1000 MCG/ML KIT Inject 1,000 mcg as directed every 30 (thirty) days.    .Marland KitchenELIQUIS 5 MG TABS tablet Take 5 mg by mouth 2 (two) times daily.     .Marland Kitchengabapentin (NEURONTIN) 300 MG capsule Take 300 mg by mouth 2 (two) times daily.    .Marland Kitchenibuprofen (ADVIL,MOTRIN) 200 MG tablet Take 200 mg by mouth every 6 (six) hours as needed for mild pain.    . Melatonin 1 MG TABS Take 1 mg by mouth daily as needed (sleep).    .  metoprolol tartrate (LOPRESSOR) 25 MG tablet Take 50 mg by mouth 2 (two) times daily.     .Marland Kitchenspironolactone (ALDACTONE) 25 MG tablet Take 25 mg by mouth daily.     .Marland Kitchenumeclidinium-vilanterol (ANORO ELLIPTA) 62.5-25 MCG/INH AEPB Inhale 1 puff into the lungs daily.     .Marland Kitchenzolpidem (AMBIEN) 5 MG tablet Take 1 tablet (5 mg total) by mouth at bedtime as needed for sleep. 10 tablet 0  .  Current Facility-Administered Medications  Medication Dose Route Frequency Provider Last Rate Last Dose  . acetaminophen (TYLENOL) tablet 650 mg  650 mg Oral Q6H PRN SHillary Bow MD   650 mg at 06/04/17 2031   Or  . acetaminophen (TYLENOL) suppository 650 mg  650 mg Rectal Q6H PRN Sudini, SAlveta Heimlich MD      . albuterol (PROVENTIL) (2.5 MG/3ML) 0.083% nebulizer solution 2.5 mg  2.5 mg Nebulization Q2H PRN Sudini, Srikar, MD      . allopurinol (ZYLOPRIM) tablet 100 mg  100 mg Oral Daily SHillary Bow MD   100 mg at 06/05/17 1105  . atorvastatin (LIPITOR) tablet 80 mg  80 mg Oral QHS SHillary Bow MD   80 mg at 06/04/17 2124  . azelastine (ASTELIN) 0.1 % nasal spray 1-2 spray  1-2 spray Each Nare BID PRN  Hillary Bow, MD      . furosemide (LASIX) injection 80 mg  80 mg Intravenous BID Hillary Bow, MD   80 mg at 06/05/17 1113  . gabapentin (NEURONTIN) capsule 300 mg  300 mg Oral BID Hillary Bow, MD   300 mg at 06/05/17 1105  . ibuprofen (ADVIL,MOTRIN) tablet 200 mg  200 mg Oral Q6H PRN Sudini, Alveta Heimlich, MD      . Melatonin TABS 2.5 mg  2.5 mg Oral QHS PRN Sudini, Srikar, MD      . metoprolol tartrate (LOPRESSOR) tablet 50 mg  50 mg Oral BID Hillary Bow, MD   50 mg at 06/05/17 1104  . nystatin (MYCOSTATIN/NYSTOP) topical powder   Topical BID Gladstone Lighter, MD      . ondansetron [facility]) tablet 4 mg  4 mg Oral Q6H PRN Hillary Bow, MD       Or  . ondansetron (ZOFRAN) injection 4 mg  4 mg Intravenous Q6H PRN Sudini, Srikar, MD      . polyethylene glycol (MIRALAX / GLYCOLAX) packet 17 g  17 g Oral Daily PRN  Hillary Bow, MD   17 g at 06/04/17 1544  . sodium chloride flush (NS) 0.9 % injection 3 mL  3 mL Intravenous Q12H Sudini, Alveta Heimlich, MD   3 mL at 06/05/17 1106  . spironolactone (ALDACTONE) tablet 25 mg  25 mg Oral Daily Sudini, Srikar, MD   25 mg at 06/05/17 1104  . umeclidinium-vilanterol (ANORO ELLIPTA) 62.5-25 MCG/INH 1 puff  1 puff Inhalation Daily Sudini, Alveta Heimlich, MD   1 puff at 06/05/17 1113  . zolpidem (AMBIEN) tablet 5 mg  5 mg Oral QHS PRN Hillary Bow, MD   5 mg at 06/04/17 2122    PMH:  Past Medical History:  Diagnosis Date  . A-fib ([place])   . Adrenal adenoma   . CHF (congestive heart failure) ([place])   . Coronary artery disease   . DDD (degenerative disc disease), cervical   . Hyperlipidemia   . Hypertension   . Lung nodule   . Restrictive lung disease     Fam Hx:  Family History  Problem Relation Age of Onset  . Aortic aneurysm Mother     Soc Hx:  Social History   Socioeconomic History  . Marital status: Married    Spouse name: Not on file  . Number of children: Not on file  . Years of education: Not on file  . Highest education level: Not on file  Occupational History  . Not on file  Social Needs  . Financial resource strain: Not on file  . Food insecurity:    Worry: Not on file    Inability: Not on file  . Transportation needs:    Medical: Not on file    Non-medical: Not on file  Tobacco Use  . Smoking status: Former Smoker    Packs/day: 1.00    Years: 42.00    Pack years: 42.00    Types: Cigarettes  . Smokeless tobacco: Never Used  . Tobacco comment: quit 42 years ago  Substance and Sexual Activity  . Alcohol use: Yes    Comment: occasionally  . Drug use: No  . Sexual activity: Not on file  Lifestyle  . Physical activity:    Days per week: Not on file    Minutes per session: Not on file  . Stress: Not on file  Relationships  . Social connections:    Talks on phone: Not on file  Gets together: Not on file    Attends religious  service: Not on file    Active member of club or organization: Not on file    Attends meetings of clubs or organizations: Not on file    Relationship status: Not on file  . Intimate partner violence:    Fear of current or ex partner: Not on file    Emotionally abused: Not on file    Physically abused: Not on file    Forced sexual activity: Not on file  Other Topics Concern  . Not on file  Social History Narrative  . Not on file    PSH:  Past Surgical History:  Procedure Laterality Date  . CARDIAC SURGERY    . CATARACT EXTRACTION, BILATERAL    . EXPLORATION POST OPERATIVE OPEN HEART    . LUMBAR FUSION    . PERIPHERAL VASCULAR CATHETERIZATION Left 12/30/2015   Procedure: Lower Extremity Angiography;  Surgeon: Algernon Huxley, MD;  Location: [place] [facility];  Service: Cardiovascular;  Laterality: Left;  . PERIPHERAL VASCULAR CATHETERIZATION  12/30/2015   Procedure: Lower Extremity Intervention;  Surgeon: Algernon Huxley, MD;  Location: [place] [facility];  Service: Cardiovascular;;  . REPLACEMENT TOTAL KNEE BILATERAL    . Procedures since admission: No admission procedures for hospital encounter.  ROS: Review of systems normal other than 12 systems except per HPI.  PHYSICAL EXAM Vitals:  Vitals:   06/05/17 0809 06/05/17 1102  BP: 139/89 121/83  Pulse: 78 73  Resp: 14   Temp:    SpO2: 96%   . General: Well-developed, Well-nourished in no acute distress Mood: Mood and affect well adjusted, pleasant and cooperative. Orientation: Grossly alert and oriented. Vocal Quality: No hoarseness. Communicates verbally. head and Face: NCAT. No facial asymmetry. No visible skin lesions. No significant facial scars. No tenderness with sinus percussion. Facial strength normal and symmetric. Ears: External ears with normal landmarks, no lesions. External auditory canals free of infection.  Right TM is found to be clear but the left TM is obscured by some cerumen. Hearing: Speech reception  grossly normal. Nose: External nose normal with midline dorsum and no lesions or deformity. Nasal Cavity reveals some rightward septal deviation and blood clot in the left nasal cavity without active bleeding.  After removal of the blood clot he is noted to have a prominent vessel on the inferior aspect of the septum, about 1.5 cm back.  It was noted to bleed mildly after removal of the blood clot. Oral Cavity/ Oropharynx: Lips are normal with no lesions. Teeth no frank dental caries. Gingiva healthy with no lesions or gingivitis. Oropharynx including tongue, buccal mucosa, floor of mouth, hard and soft palate, uvula and posterior pharynx free of exudates, erythema or lesions with normal symmetry and hydration.  No bleeding down the back of the throat Indirect Laryngoscopy/Nasopharyngoscopy: Visualization of the larynx, hypopharynx and nasopharynx is not possible in this setting with routine examination. Neck: Supple and symmetric with no palpable masses, tenderness or crepitance. The trachea is midline. Thyroid gland is soft, nontender and symmetric with no masses or enlargement. Parotid and submandibular glands are soft, nontender and symmetric, without masses. Lymphatic: Cervical lymph nodes are without palpable lymphadenopathy or tenderness. Respiratory: Normal respiratory effort without labored breathing. Cardiovascular: Carotid pulse shows regular rate and rhythm Neurologic: Cranial Nerves II through XII are grossly intact. Eyes: Gaze and Ocular Motility are grossly normal. PERRLA. No visible nystagmus.  MEDICAL DECISION MAKING: Data Review:  Results for orders placed  or performed during the hospital encounter of 06/04/17 (from the past 48 hour(s))  Comprehensive metabolic panel     Status: Abnormal   Collection Time: 06/04/17  1:17 PM  Result Value Ref Range   Sodium 136 135 - 145 mmol/L   Potassium 4.3 3.5 - 5.1 mmol/L   Chloride 93 (L) 101 - 111 mmol/L   CO2 37 (H) 22 - 32 mmol/L    Glucose, Bld 127 (H) 65 - 99 mg/dL   BUN 46 (H) 6 - 20 mg/dL   Creatinine, Ser 1.35 (H) 0.61 - 1.24 mg/dL   Calcium 8.8 (L) 8.9 - 10.3 mg/dL   Total Protein 7.3 6.5 - 8.1 g/dL   Albumin 3.6 3.5 - 5.0 g/dL   AST 22 15 - 41 U/L   ALT 11 (L) 17 - 63 U/L   Alkaline Phosphatase 105 38 - 126 U/L   Total Bilirubin 1.1 0.3 - 1.2 mg/dL   GFR calc non Af Amer 45 (L) >60 mL/min   GFR calc Af Amer 52 (L) >60 mL/min    Comment: (NOTE) The eGFR has been calculated using the CKD EPI equation. This calculation has not been validated in all clinical situations. eGFR's persistently <60 mL/min signify possible Chronic Kidney Disease.    Anion gap 6 5 - 15    Comment: Performed at [facility], [address] [place], [place] [address]  Magnesium     Status: None   Collection Time: 06/04/17  1:17 PM  Result Value Ref Range   Magnesium 2.3 1.7 - 2.4 mg/dL    Comment: Performed at [facility], [address] [place], [place] [address]  CBC WITH DIFFERENTIAL     Status: Abnormal   Collection Time: 06/04/17  1:17 PM  Result Value Ref Range   WBC 9.9 3.8 - 10.6 K/uL   RBC 4.20 (L) 4.40 - 5.90 MIL/uL   Hemoglobin 13.1 13.0 - 18.0 g/dL   HCT 40.2 40.0 - 52.0 %   MCV 95.7 80.0 - 100.0 fL   MCH 31.1 26.0 - 34.0 pg   MCHC 32.5 32.0 - 36.0 g/dL   RDW 18.9 (H) 11.5 - 14.5 %   Platelets 202 150 - 440 K/uL   Neutrophils Relative % 81 %   Neutro Abs 8.0 (H) 1.4 - 6.5 K/uL   Lymphocytes Relative 9 %   Lymphs Abs 0.9 (L) 1.0 - 3.6 K/uL   Monocytes Relative 9 %   Monocytes Absolute 0.9 0.2 - 1.0 K/uL   Eosinophils Relative 1 %   Eosinophils Absolute 0.1 0 - 0.7 K/uL   Basophils Relative 0 %   Basophils Absolute 0.0 0 - 0.1 K/uL    Comment: Performed at [facility], [address] [place], [place] [address]  TSH     Status: None   Collection Time: 06/04/17  1:17 PM  Result Value Ref Range   TSH 2.658 0.350 - 4.500 uIU/mL    Comment: Performed by a 3rd Generation assay  with a functional sensitivity of <=0.01 uIU/mL. Performed at [facility], [address] [place], [place] [address]   Brain natriuretic peptide     Status: Abnormal   Collection Time: 06/04/17  1:17 PM  Result Value Ref Range   B Natriuretic Peptide 328.0 (H) 0.0 - 100.0 pg/mL    Comment: Performed at [facility], [address] [place], [place] [address]  Basic metabolic panel     Status: Abnormal   Collection  Time: 06/05/17  4:41 AM  Result Value Ref Range   Sodium 136 135 - 145 mmol/L   Potassium 4.2 3.5 - 5.1 mmol/L   Chloride 94 (L) 101 - 111 mmol/L   CO2 35 (H) 22 - 32 mmol/L   Glucose, Bld 123 (H) 65 - 99 mg/dL   BUN 50 (H) 6 - 20 mg/dL   Creatinine, Ser 1.46 (H) 0.61 - 1.24 mg/dL   Calcium 8.6 (L) 8.9 - 10.3 mg/dL   GFR calc non Af Amer 41 (L) >60 mL/min   GFR calc Af Amer 47 (L) >60 mL/min    Comment: (NOTE) The eGFR has been calculated using the CKD EPI equation. This calculation has not been validated in all clinical situations. eGFR's persistently <60 mL/min signify possible Chronic Kidney Disease.    Anion gap 7 5 - 15    Comment: Performed at [facility], [address] [place], [place] [address]  CBC     Status: Abnormal   Collection Time: 06/05/17  4:41 AM  Result Value Ref Range   WBC 8.2 3.8 - 10.6 K/uL   RBC 3.97 (L) 4.40 - 5.90 MIL/uL   Hemoglobin 12.3 (L) 13.0 - 18.0 g/dL   HCT 38.0 (L) 40.0 - 52.0 %   MCV 95.8 80.0 - 100.0 fL   MCH 31.1 26.0 - 34.0 pg   MCHC 32.5 32.0 - 36.0 g/dL   RDW 18.5 (H) 11.5 - 14.5 %   Platelets 191 150 - 440 K/uL    Comment: Performed at [facility], [address] [place], [place] [address]  Protime-INR     Status: Abnormal   Collection Time: 06/05/17  9:08 AM  Result Value Ref Range   Prothrombin Time 16.0 (H) 11.4 - 15.2 seconds   INR 1.29     Comment: Performed at [facility], [address], [place], [place] [address]  . No results found.Marland Kitchen    PROCEDURE: Procedure: Cautery of left nasal septum for control of epistaxis Diagnosis: Epistaxis Indications: Patient with recurrent epistaxis from the left nostril Findings: Prominent vessel noted on left anterior inferior nasal septum, actively bleeding after clearing the nose of blood clot Description of Procedure: After discussing procedure and risks  (primarily nose bleed) with the patient, the nose was anesthetized with topical Lidocaine 4% and decongested with phenylephrine.  Blood clot was removed from the nasal cavity and a small bleeding vessel noted anteriorly and inferiorly.  This was cauterized with application of silver nitrate, which controlled the bleeding.  findings are as noted above. The scope was withdrawn. The patient tolerated the procedure well.  ASSESSMENT: Recurrent epistaxis  PLAN: Left septum was cauterized.  Would recommend application of antibiotic ointment 2-3 times daily for 14 days to aid in healing.  Long-term he can consider use of nasal saline gel to moisturize the nose, preventing nasal dryness that might aggravate further bleeding.   Norman Nearing, MD 06/05/2017 11:32 AM

## 2017-06-05 NOTE — Progress Notes (Signed)
Patient refused chair alarm and socks. Educated on safety. Agrees to call before getting up.

## 2017-06-05 NOTE — Progress Notes (Signed)
Report given to [facility] and care transferred at this time.

## 2017-06-06 ENCOUNTER — Inpatient Hospital Stay: Payer: Medicare Other

## 2017-06-06 MED ORDER — ASPIRIN EC 81 MG PO TBEC: 81.0000 mg | DELAYED_RELEASE_TABLET | Freq: Every day | ORAL | Status: DC

## 2017-06-06 MED ORDER — ASPIRIN EC 81 MG PO TBEC
81.0000 mg | DELAYED_RELEASE_TABLET | Freq: Once | ORAL | Status: AC
  Administered 2017-06-06: 81 mg via ORAL
  Filled 2017-06-06 (×2): qty 1

## 2017-06-06 NOTE — Consult Note (Signed)
Cardiology Consultation Note    Patient ID: Norman Morales, MRN: [id number], DOB/AGE: [birth year] 84 y.o. Admit date: 06/04/2017   Date of Consult: 06/06/2017 Primary Physician: Adin Hector, MD Primary Cardiologist: Dr. Nehemiah Massed  Chief Complaint: lower extremity edema Reason for Consultation: chf Requesting MD: Dr. Tressia Miners  HPI: Norman Morales is a 84 y.o. male with history of afib, hypertension, restrictive lung disease, hyperlipidemia, HFpEF with ef of 65%, admitted after being referred by his pcp for persistent lower extremety edema and sob. Has chronic lower extremity edema which has been treated with unna boots as an outpatient. He had them removed when he went to his pcp. The legs are weekping. He denies fever or chills. He has been compliant with meds. He has had some nose bleeding and was seen by ENT during this admission. CXR showed possible mild pulmonary edema. He has acute on chronic renal disease. Creatinine appears to be 1.3-1.4 at baseline and is 1.46 at present. BNP is mildly increased over his previous value at 328. He has no chest pian but has mild sob.   Past Medical History:  Diagnosis Date  . A-fib ([place])   . Adrenal adenoma   . CHF (congestive heart failure) ([place])   . Coronary artery disease   . DDD (degenerative disc disease), cervical   . Hyperlipidemia   . Hypertension   . Lung nodule   . Restrictive lung disease       Surgical History:  Past Surgical History:  Procedure Laterality Date  . CARDIAC SURGERY    . CATARACT EXTRACTION, BILATERAL    . EXPLORATION POST OPERATIVE OPEN HEART    . LUMBAR FUSION    . PERIPHERAL VASCULAR CATHETERIZATION Left 12/30/2015   Procedure: Lower Extremity Angiography;  Surgeon: Algernon Huxley, MD;  Location: [facility];  Service: Cardiovascular;  Laterality: Left;  . PERIPHERAL VASCULAR CATHETERIZATION  12/30/2015   Procedure: Lower Extremity Intervention;  Surgeon: Algernon Huxley, MD;  Location: [facility];  Service: Cardiovascular;;  . REPLACEMENT TOTAL KNEE BILATERAL       Home Meds: Prior to Admission medications   Medication Sig Start Date End Date Taking? Authorizing Provider  albuterol (PROVENTIL HFA;VENTOLIN HFA) 108 (90 Base) MCG/ACT inhaler Inhale 2 puffs into the lungs every 4 (four) hours as needed for wheezing or shortness of breath. 12/25/16   Hillary Bow, MD  allopurinol (ZYLOPRIM) 100 MG tablet TAKE 2 TABLETS BY MOUTH EVERY DAY AS DIRECTED 07/15/15   [provider]  aspirin EC 81 MG tablet Take 81 mg by mouth at bedtime.     [provider]  atorvastatin (LIPITOR) 80 MG tablet TAKE ONE TABLET AT BEDTIME 04/15/15   [provider]  azelastine (ASTELIN) 0.1 % nasal spray Place 1-2 sprays into the nose 2 (two) times daily as needed for rhinitis.     [provider]  bumetanide (BUMEX) 2 MG tablet Take 22ms daily in the morning 12/03/15   [provider]  Coenzyme Q10 (COQ-10) 200 MG CAPS Take 200 mg by mouth every evening.    [provider]  Cyanocobalamin (B-12) 1000 MCG/ML KIT Inject 1,000 mcg as directed every 30 (thirty) days.    [provider]  ELIQUIS 5 MG TABS tablet Take 5 mg by mouth 2 (two) times daily.  12/16/15   [provider]  gabapentin (NEURONTIN) 300 MG capsule Take 300 mg by mouth 2 (two) times daily. 12/14/16   [provider]  ibuprofen (ADVIL,MOTRIN) 200 MG tablet Take 200 mg by mouth every 6 (six) hours as needed for mild pain.    [provider]  Melatonin 1 MG TABS Take 1 mg by mouth daily as needed (sleep).    [provider]  metoprolol tartrate (LOPRESSOR) 25 MG tablet Take 50 mg by mouth 2 (two) times daily.  12/16/15   [provider]  spironolactone (ALDACTONE) 25 MG tablet Take 25 mg by mouth daily.  12/16/15   [provider]  umeclidinium-vilanterol (ANORO ELLIPTA) 62.5-25 MCG/INH AEPB Inhale 1 puff into the lungs daily.  05/16/14    [provider]  zolpidem (AMBIEN) 5 MG tablet Take 1 tablet (5 mg total) by mouth at bedtime as needed for sleep. 12/25/16   Hillary Bow, MD    Inpatient Medications:  . allopurinol  100 mg Oral Daily  . atorvastatin  80 mg Oral QHS  . bacitracin  1 application Topical TID  . furosemide  80 mg Intravenous BID  . gabapentin  300 mg Oral BID  . metoprolol tartrate  50 mg Oral BID  . nystatin   Topical BID  . sodium chloride flush  3 mL Intravenous Q12H  . spironolactone  25 mg Oral Daily  . umeclidinium-vilanterol  1 puff Inhalation Daily     Allergies: No Known Allergies  Social History   Socioeconomic History  . Marital status: Married    Spouse name: Not on file  . Number of children: Not on file  . Years of education: Not on file  . Highest education level: Not on file  Occupational History  . Not on file  Social Needs  . Financial resource strain: Not on file  . Food insecurity:    Worry: Not on file    Inability: Not on file  . Transportation needs:    Medical: Not on file    Non-medical: Not on file  Tobacco Use  . Smoking status: Former Smoker    Packs/day: 1.00    Years: 42.00    Pack years: 42.00    Types: Cigarettes  . Smokeless tobacco: Never Used  . Tobacco comment: quit 42 years ago  Substance and Sexual Activity  . Alcohol use: Yes    Comment: occasionally  . Drug use: No  . Sexual activity: Not on file  Lifestyle  . Physical activity:    Days per week: Not on file    Minutes per session: Not on file  . Stress: Not on file  Relationships  . Social connections:    Talks on phone: Not on file    Gets together: Not on file    Attends religious service: Not on file    Active member of club or organization: Not on file    Attends meetings of clubs or organizations: Not on file    Relationship status: Not on file  . Intimate partner violence:    Fear of current or ex partner: Not on file    Emotionally abused: Not on file     Physically abused: Not on file    Forced sexual activity: Not on file  Other Topics Concern  . Not on file  Social History Narrative  . Not on file     Family History  Problem Relation Age of Onset  . Aortic aneurysm Mother      Review of Systems: A 12-system review of systems was performed and is negative except as noted in the HPI.  Labs: No results for input(s): CKTOTAL, CKMB, TROPONINI in the last 72 hours. Lab Results  Component Value Date   WBC 8.2 06/05/2017   HGB 12.3 (L) 06/05/2017   HCT 38.0 (L) 06/05/2017   MCV 95.8 06/05/2017   PLT 191 06/05/2017    Recent Labs  Lab 06/04/17 1317 06/05/17 0441  NA 136 136  K 4.3 4.2  CL 93* 94*  CO2 37* 35*  BUN 46* 50*  CREATININE 1.35* 1.46*  CALCIUM 8.8* 8.6*  PROT 7.3  --   BILITOT 1.1  --   ALKPHOS 105  --   ALT 11*  --   AST 22  --   GLUCOSE 127* 123*   No results found for: CHOL, HDL, LDLCALC, TRIG No results found for: DDIMER  Radiology/Studies:  Dg Chest 2 View  Result Date: 06/06/2017 CLINICAL DATA:  84 year old male with shortness of breath. EXAM: CHEST - 2 VIEW COMPARISON:  Chest x-ray 12/25/2016. FINDINGS: There is cephalization of the pulmonary vasculature and slight indistinctness of the interstitial markings suggestive of mild pulmonary edema. Lung volumes are low. No pleural effusions. Heart size is mildly enlarged. The patient is rotated to the right on today's exam, resulting in distortion of the mediastinal contours and reduced diagnostic sensitivity and specificity for mediastinal pathology. Atherosclerosis in the thoracic aorta. Status post median sternotomy for CABG. Multiple broken median sternotomy wires are noted. IMPRESSION: 1. The appearance the chest suggests mild congestive heart failure, as above. 2. Aortic atherosclerosis. Electronically Signed   By: Vinnie Langton M.D.   On: 06/06/2017 11:43   US Abdomen Complete  Result Date: 06/05/2017 CLINICAL DATA:  Cirrhosis. EXAM: ABDOMEN  ULTRASOUND COMPLETE COMPARISON:  CT abdomen pelvis dated September 20, 2015. FINDINGS: Gallbladder: Single gallstone noted. No wall thickening. No sonographic Murphy sign noted by sonographer. Common bile duct: Diameter: 6 mm, normal. Liver: No focal lesion identified. Slightly nodular contour. Increased in parenchymal echogenicity. Portal vein is patent on color Doppler imaging with normal direction of blood flow towards the liver. IVC: No abnormality visualized. Pancreas: Visualized portion unremarkable. Spleen: Size and appearance within normal limits. Right Kidney: Length: 12.7 cm. Echogenicity within normal limits. No mass or hydronephrosis visualized. Left Kidney: Length: 13.9 cm. Echogenicity within normal limits. No mass or hydronephrosis visualized. Abdominal aorta: No aneurysm visualized. Other findings: None. IMPRESSION: 1. Slightly nodular liver contour, consistent with history of cirrhosis. 2. Cholelithiasis. Electronically Signed   By: Titus Dubin M.D.   On: 06/05/2017 17:38   US Abdomen Limited  Result Date: 06/01/2017 CLINICAL DATA:  Abdominal distension.  Ascites search EXAM: LIMITED ABDOMEN ULTRASOUND FOR ASCITES TECHNIQUE: Limited ultrasound survey for ascites was performed in all four abdominal quadrants. COMPARISON:  None. FINDINGS: No ascites noted in the abdomen or pelvis. IMPRESSION: No visible ascites. Electronically Signed   By: Rolm Baptise M.D.   On: 06/01/2017 09:13    Wt Readings from Last 3 Encounters:  06/06/17 124.9 kg (275 lb 4.8 oz)  03/29/17 120.7 kg (266 lb)  12/27/16 120 kg (264 lb 8 oz)      Physical Exam:  Blood pressure 132/64, pulse 66, temperature 98 F (36.7 C), temperature source Oral, resp. rate 18, height 6' 1"  (1.854 m), weight 124.9 kg (275 lb 4.8 oz), SpO2 93 %. Body mass index is 36.32 kg/m. General: Well developed, well nourished, in no acute distress. Head: Normocephalic, atraumatic, sclera non-icteric, no xanthomas, nares are without  discharge.  Neck: Negative for carotid bruits. JVD not elevated. Lungs: Decreased  breath sounds bilaterally.  Heart: irr, irr with S1 S2. No murmurs, rubs, or gallops appreciated. Abdomen: Soft, non-tender, non-distended with normoactive bowel sounds. No hepatomegaly. No rebound/guarding. No obvious abdominal masses. Msk:  Strength and tone appear normal for age. Extremities:4+ lower extremity edema with weeping. No ulcers noted Neuro: Alert and oriented X 3. No facial asymmetry. No focal deficit. Moves all extremities spontaneously. Psych:  Responds to questions appropriately with a normal affect.     Assessment and Plan  84 yo male with history of diastolic heart failure with preserved ef admitted with progressive sob. He has had progressive incrase in his edema despite aggressive po torsemide. He reports about a 20 pound weight gain in 2 weeks. He has diuresed 3.4 liters since admission and is down approximately 1 kg. CXR shows mild pulmonary edema. Will continue diuresis and follow renal function and electrolytes. Will replace unna boots.   afib-continue with rate control with metoprolol and hold anticoagulation due ot nose bleed.   Signed, Teodoro Spray MD 06/06/2017, 12:11 PM Pager: [phone]

## 2017-06-06 NOTE — Progress Notes (Signed)
[facility] NAME: Norman Morales    MR#:  [id number]  DATE OF BIRTH:  1926-03-17  SUBJECTIVE:  CHIEF COMPLAINT:  No chief complaint on file.  -Feels about the same.  Epistaxis is improved today. -Still has bilateral lower extremity edema with redness.  REVIEW OF SYSTEMS:  Review of Systems  Constitutional: Positive for malaise/fatigue. Negative for chills and fever.  HENT: Positive for nosebleeds. Negative for hearing loss.   Respiratory: Positive for cough and shortness of breath. Negative for wheezing.   Cardiovascular: Positive for leg swelling. Negative for chest pain and palpitations.  Gastrointestinal: Positive for abdominal pain. Negative for constipation, diarrhea, nausea and vomiting.  Genitourinary: Negative for dysuria.  Musculoskeletal: Negative for myalgias.  Neurological: Negative for dizziness, focal weakness, seizures, weakness and headaches.  Psychiatric/Behavioral: Negative for depression.    DRUG ALLERGIES:  No Known Allergies  VITALS:  Blood pressure 132/64, pulse 66, temperature 98 F (36.7 C), temperature source Oral, resp. rate 18, height 6\' 1"  (1.854 m), weight 124.9 kg (275 lb 4.8 oz), SpO2 93 %.  PHYSICAL EXAMINATION:  Physical Exam  GENERAL:  84 y.o.-year-old patient lying in the bed with no acute distress.  EYES: Pupils equal, round, reactive to light and accommodation. No scleral icterus. Extraocular muscles intact.  HEENT: Head atraumatic, normocephalic. Oropharynx  clear.  Left nare packing NECK:  Supple, no jugular venous distention. No thyroid enlargement, no tenderness.  LUNGS: Normal breath sounds bilaterally, no wheezing, rhonchi or crepitation.  Fine bibasilar crackles heard no use of accessory muscles of respiration.  CARDIOVASCULAR: S1, S2 normal. No  rubs, or gallops. 2/6 systolic murmur present ABDOMEN: Soft, nontender, very distended. Bowel sounds present. No organomegaly or mass.    EXTREMITIES: No  cyanosis, or clubbing.  3+ type pitting edema of both lower extremities with blisters. NEUROLOGIC: Cranial nerves II through XII are intact. Muscle strength 5/5 in all extremities. Sensation intact. Gait not checked.  PSYCHIATRIC: The patient is alert and oriented x 3.  SKIN: No obvious rash, lesion, or ulcer.  Under the breasts moist areas noted   LABORATORY PANEL:   CBC Recent Labs  Lab 06/05/17 0441  WBC 8.2  HGB 12.3*  HCT 38.0*  PLT 191   ------------------------------------------------------------------------------------------------------------------  Chemistries  Recent Labs  Lab 06/04/17 1317 06/05/17 0441  NA 136 136  K 4.3 4.2  CL 93* 94*  CO2 37* 35*  GLUCOSE 127* 123*  BUN 46* 50*  CREATININE 1.35* 1.46*  CALCIUM 8.8* 8.6*  MG 2.3  --   AST 22  --   ALT 11*  --   ALKPHOS 105  --   BILITOT 1.1  --    ------------------------------------------------------------------------------------------------------------------  Cardiac Enzymes No results for input(s): TROPONINI in the last 168 hours. ------------------------------------------------------------------------------------------------------------------  RADIOLOGY:  Dg Chest 2 View  Result Date: 06/06/2017 CLINICAL DATA:  84 year old male with shortness of breath. EXAM: CHEST - 2 VIEW COMPARISON:  Chest x-ray 12/25/2016. FINDINGS: There is cephalization of the pulmonary vasculature and slight indistinctness of the interstitial markings suggestive of mild pulmonary edema. Lung volumes are low. No pleural effusions. Heart size is mildly enlarged. The patient is rotated to the right on today's exam, resulting in distortion of the mediastinal contours and reduced diagnostic sensitivity and specificity for mediastinal pathology. Atherosclerosis in the thoracic aorta. Status post median sternotomy for CABG. Multiple broken median sternotomy wires are noted. IMPRESSION: 1. The appearance the chest  suggests mild congestive heart failure,  as above. 2. Aortic atherosclerosis. Electronically Signed   By: Vinnie Langton M.D.   On: 06/06/2017 11:43   US Abdomen Complete  Result Date: 06/05/2017 CLINICAL DATA:  Cirrhosis. EXAM: ABDOMEN ULTRASOUND COMPLETE COMPARISON:  CT abdomen pelvis dated September 20, 2015. FINDINGS: Gallbladder: Single gallstone noted. No wall thickening. No sonographic Murphy sign noted by sonographer. Common bile duct: Diameter: 6 mm, normal. Liver: No focal lesion identified. Slightly nodular contour. Increased in parenchymal echogenicity. Portal vein is patent on color Doppler imaging with normal direction of blood flow towards the liver. IVC: No abnormality visualized. Pancreas: Visualized portion unremarkable. Spleen: Size and appearance within normal limits. Right Kidney: Length: 12.7 cm. Echogenicity within normal limits. No mass or hydronephrosis visualized. Left Kidney: Length: 13.9 cm. Echogenicity within normal limits. No mass or hydronephrosis visualized. Abdominal aorta: No aneurysm visualized. Other findings: None. IMPRESSION: 1. Slightly nodular liver contour, consistent with history of cirrhosis. 2. Cholelithiasis. Electronically Signed   By: Titus Dubin M.D.   On: 06/05/2017 17:38    EKG:  No orders found for this or any previous visit.  ASSESSMENT AND PLAN:   84 y/o male with past medical history significant for diastolic CHF, A. fib, CKD stage III presents to hospital secondary to worsening weight gain and anasarca and respiratory distress.  1. Acute hypoxic respiratory failure-secondary to acute on chronic diastolic heart failure. -Patient was using 2 L oxygen as needed at home, increased up to 3 and 4 L prior to admission and continuously using it. -Chest x-ray with pulmonary edema.  Torsemide was recently increased as outpatient with not much benefit. -IV Lasix twice daily.  Continue IV Lasix as almost net negative by 4 L -Continue supplemental oxygen  for now. -Daily weights, strict input and output monitoring -Appreciate cardiology consult  2. Epistaxis-has epistaxis episodes which are spontaneously resolved at home. -Hold Eliquis and aspirin for now -Appreciate ENT consult.  Left septum was cauterized and antibiotic ointment for 2 weeks recommended.  3. Anasarca- on IV lasix for now - leg edema- unna wraps after wound care consult US abdomen showing signs of early cirrhosis, no significant ascites seen  4. CKD stage 3- stable for now, monitor while on IV lasix  5. Afib- rate controlled, on metoprolol Held eliquis for now due to epistaxis  6. HTN- on lasix, metoprolol, aldactone  7. DVT Prophylaxis- eliquis held for now  PT consult  Updated wife and daughter at bedside   All the records are reviewed and case discussed with Care Management/Social Workerr. Management plans discussed with the patient, family and they are in agreement.  CODE STATUS: DNR  TOTAL TIME TAKING CARE OF THIS PATIENT: 36 minutes.   POSSIBLE D/C IN 2-3 DAYS, DEPENDING ON CLINICAL CONDITION.   Iyonnah Ferrante M.D on 06/06/2017 at 1:04 PM  Between 7am to 6pm - Pager - [phone]  After 6pm go to [email or web address] - password [email or web address] [facility]  Office  [phone]  CC: Primary care physician; Adin Hector, MD

## 2017-06-06 NOTE — Progress Notes (Signed)
Patient refusing chair alarm. Educated, wife at bedside. Will continue to monitor patient.

## 2017-06-06 NOTE — Progress Notes (Signed)
Bilateral lower extremities wrapped with non-adherent dressing with kerlix. Pending wound consult. Will continue to monitor patient.

## 2017-06-06 NOTE — Progress Notes (Signed)
Pt's wife is concern that pt's Eliquis and aspirin are held, wife is requesting if we could at least restart pt's aspirin tonight. MD Sainani made aware and ordered to give 81 mg aspirin tonight. Will continue to monitor.

## 2017-06-07 LAB — GLUCOSE, CAPILLARY: Glucose-Capillary: 121 mg/dL — ABNORMAL HIGH (ref 65–99)

## 2017-06-07 LAB — BASIC METABOLIC PANEL
ANION GAP: 9 (ref 5–15)
BUN: 46 mg/dL — ABNORMAL HIGH (ref 6–20)
CO2: 35 mmol/L — ABNORMAL HIGH (ref 22–32)
CREATININE: 1.2 mg/dL (ref 0.61–1.24)
Calcium: 8.9 mg/dL (ref 8.9–10.3)
Chloride: 92 mmol/L — ABNORMAL LOW (ref 101–111)
GFR calc Af Amer: 60 mL/min — ABNORMAL LOW (ref >60)
GFR calc non Af Amer: 52 mL/min — ABNORMAL LOW (ref >60)
Glucose, Bld: 110 mg/dL — ABNORMAL HIGH (ref 65–99)
POTASSIUM: 4.2 mmol/L (ref 3.5–5.1)
SODIUM: 136 mmol/L (ref 135–145)

## 2017-06-07 MED ORDER — UMECLIDINIUM-VILANTEROL 62.5-25 MCG/INH IN AEPB
1.0000 | INHALATION_SPRAY | Freq: Every day | RESPIRATORY_TRACT | Status: DC
  Administered 2017-06-10: 1 via RESPIRATORY_TRACT
  Filled 2017-06-07: qty 14

## 2017-06-07 MED ORDER — IPRATROPIUM-ALBUTEROL 0.5-2.5 (3) MG/3ML IN SOLN
3.0000 mL | Freq: Once | RESPIRATORY_TRACT | Status: AC
  Administered 2017-06-07: 3 mL via RESPIRATORY_TRACT
  Filled 2017-06-07: qty 3

## 2017-06-07 MED ORDER — APIXABAN 5 MG PO TABS
5.0000 mg | ORAL_TABLET | Freq: Two times a day (BID) | ORAL | Status: DC
  Administered 2017-06-07 – 2017-06-10 (×6): 5 mg via ORAL
  Filled 2017-06-07 (×7): qty 1

## 2017-06-07 MED ORDER — POLYETHYLENE GLYCOL 3350 17 G PO PACK
17.0000 g | PACK | Freq: Every day | ORAL | Status: DC
  Administered 2017-06-07 – 2017-06-10 (×4): 17 g via ORAL
  Filled 2017-06-07 (×4): qty 1

## 2017-06-07 NOTE — Progress Notes (Signed)
[facility] NAME: Norman Morales    MR#:  [id number]  DATE OF BIRTH:  [birth year]  SUBJECTIVE:  CHIEF COMPLAINT:  No chief complaint on file.  - feeling some better today. - weight down by almost 16 lbs since admission - renal function stable, no further nose bleeds  REVIEW OF SYSTEMS:  Review of Systems  Constitutional: Positive for malaise/fatigue. Negative for chills and fever.  HENT: Positive for nosebleeds. Negative for hearing loss.   Respiratory: Positive for cough and shortness of breath. Negative for wheezing.   Cardiovascular: Positive for leg swelling. Negative for chest pain and palpitations.  Gastrointestinal: Positive for abdominal pain. Negative for constipation, diarrhea, nausea and vomiting.  Genitourinary: Negative for dysuria.  Musculoskeletal: Negative for myalgias.  Neurological: Negative for dizziness, focal weakness, seizures, weakness and headaches.  Psychiatric/Behavioral: Negative for depression.    DRUG ALLERGIES:  No Known Allergies  VITALS:  Blood pressure 126/68, pulse 78, temperature 97.8 F (36.6 C), temperature source Oral, resp. rate 18, height 6\' 1"  (1.854 m), weight 116.6 kg (257 lb), SpO2 90 %.  PHYSICAL EXAMINATION:  Physical Exam  GENERAL:  84 y.o.-year-old patient lying in the bed with no acute distress.  EYES: Pupils equal, round, reactive to light and accommodation. No scleral icterus. Extraocular muscles intact.  HEENT: Head atraumatic, normocephalic. Oropharynx  clear.  Left nare packing NECK:  Supple, no jugular venous distention. No thyroid enlargement, no tenderness.  LUNGS: Normal breath sounds bilaterally, no wheezing, rhonchi or crepitation.  Fine bibasilar crackles heard no use of accessory muscles of respiration.  CARDIOVASCULAR: S1, S2 normal. No  rubs, or gallops. 2/6 systolic murmur present ABDOMEN: Soft, nontender, very distended. Bowel sounds present. No organomegaly  or mass.  EXTREMITIES: No  cyanosis, or clubbing.  3+ type pitting edema of both lower extremities with blisters. NEUROLOGIC: Cranial nerves II through XII are intact. Muscle strength 5/5 in all extremities. Sensation intact. Gait not checked.  PSYCHIATRIC: The patient is alert and oriented x 3.  SKIN: No obvious rash, lesion, or ulcer.  Under the breasts moist areas noted   LABORATORY PANEL:   CBC Recent Labs  Lab 06/05/17 0441  WBC 8.2  HGB 12.3*  HCT 38.0*  PLT 191   ------------------------------------------------------------------------------------------------------------------  Chemistries  Recent Labs  Lab 06/04/17 1317  06/07/17 0518  NA 136   < > 136  K 4.3   < > 4.2  CL 93*   < > 92*  CO2 37*   < > 35*  GLUCOSE 127*   < > 110*  BUN 46*   < > 46*  CREATININE 1.35*   < > 1.20  CALCIUM 8.8*   < > 8.9  MG 2.3  --   --   AST 22  --   --   ALT 11*  --   --   ALKPHOS 105  --   --   BILITOT 1.1  --   --    < > = values in this interval not displayed.   ------------------------------------------------------------------------------------------------------------------  Cardiac Enzymes No results for input(s): TROPONINI in the last 168 hours. ------------------------------------------------------------------------------------------------------------------  RADIOLOGY:  Dg Chest 2 View  Result Date: 06/06/2017 CLINICAL DATA:  84 year old male with shortness of breath. EXAM: CHEST - 2 VIEW COMPARISON:  Chest x-ray 12/25/2016. FINDINGS: There is cephalization of the pulmonary vasculature and slight indistinctness of the interstitial markings suggestive of mild pulmonary edema. Lung volumes are low. No pleural effusions.  Heart size is mildly enlarged. The patient is rotated to the right on today's exam, resulting in distortion of the mediastinal contours and reduced diagnostic sensitivity and specificity for mediastinal pathology. Atherosclerosis in the thoracic aorta. Status  post median sternotomy for CABG. Multiple broken median sternotomy wires are noted. IMPRESSION: 1. The appearance the chest suggests mild congestive heart failure, as above. 2. Aortic atherosclerosis. Electronically Signed   By: Vinnie Langton M.D.   On: 06/06/2017 11:43   US Abdomen Complete  Result Date: 06/05/2017 CLINICAL DATA:  Cirrhosis. EXAM: ABDOMEN ULTRASOUND COMPLETE COMPARISON:  CT abdomen pelvis dated September 20, 2015. FINDINGS: Gallbladder: Single gallstone noted. No wall thickening. No sonographic Murphy sign noted by sonographer. Common bile duct: Diameter: 6 mm, normal. Liver: No focal lesion identified. Slightly nodular contour. Increased in parenchymal echogenicity. Portal vein is patent on color Doppler imaging with normal direction of blood flow towards the liver. IVC: No abnormality visualized. Pancreas: Visualized portion unremarkable. Spleen: Size and appearance within normal limits. Right Kidney: Length: 12.7 cm. Echogenicity within normal limits. No mass or hydronephrosis visualized. Left Kidney: Length: 13.9 cm. Echogenicity within normal limits. No mass or hydronephrosis visualized. Abdominal aorta: No aneurysm visualized. Other findings: None. IMPRESSION: 1. Slightly nodular liver contour, consistent with history of cirrhosis. 2. Cholelithiasis. Electronically Signed   By: Titus Dubin M.D.   On: 06/05/2017 17:38    EKG:  No orders found for this or any previous visit.  ASSESSMENT AND PLAN:   84 y/o male with past medical history significant for diastolic CHF, A. fib, CKD stage III presents to hospital secondary to worsening weight gain and anasarca and respiratory distress.  1. Acute hypoxic respiratory failure-secondary to acute on chronic diastolic heart failure. -Patient was using 2 L oxygen as needed at home,now on 3L o2  -Chest x-ray with pulmonary edema.  Torsemide was recently increased as outpatient with not much benefit. -IV Lasix twice daily.  Continue IV  Lasix for atleast 1 more day , weight getting close to dry weight, net negative by 6L since adm -Continue supplemental oxygen for now. Wean as tolerated -Daily weights, strict input and output monitoring -Appreciate cardiology consult  2. Epistaxis-has epistaxis episodes which are spontaneously resolved at home. -Appreciate ENT consult.  Left septum was cauterized and antibiotic ointment for 2 weeks recommended. - eliquis restarted  3. Anasarca- on IV lasix for now - leg edema- unna wraps with wound care consult US abdomen showing signs of early cirrhosis, no significant ascites seen  4. CKD stage 3- stable for now, monitor while on IV lasix  5. Afib- rate controlled, on metoprolol On eliquis   6. HTN- on lasix, metoprolol, aldactone  7. DVT Prophylaxis- eliquis    PT consult - needs outpatient PT at discharge Updated wife  at bedside   All the records are reviewed and case discussed with Care Management/Social Workerr. Management plans discussed with the patient, family and they are in agreement.  CODE STATUS: DNR  TOTAL TIME TAKING CARE OF THIS PATIENT: 33 minutes.   POSSIBLE D/C IN 1-2 DAYS, DEPENDING ON CLINICAL CONDITION.   Gladstone Lighter M.D on 06/07/2017 at 2:19 PM  Between 7am to 6pm - Pager - [phone]  After 6pm go to [email or web address] - password [email or web address] [facility]  Office  [phone]  CC: Primary care physician; Adin Hector, MD

## 2017-06-07 NOTE — Consult Note (Signed)
[facility] Nurse wound consult note Reason for Consult: bilateral LE wounds Patient with known venous stasis with weeping noted in two areas on the right LE (dorsal foot and right medial); left leg laterally  Wound type:weeping related to venous stasis dx. Patient has had Unna's boots at home that were managed by his family, wife is retired Marine scientist and daughter owns medical supply company Pressure Injury POA:NA Drainage (amount, consistency, odor) areas that are weeping, it is serous fluid with no odor Periwound: redness noted bilaterally with skin changes related to venous disease  Dressing procedure/placement/frequency: Covered weeping areas with vaseline gauze.  Applied zinc Unna's boots bilaterally. Patient tolerated well.  Will follow up later in the week to make sure if edema has improved and wraps are too loose will need to re-wrap.  [place], [place], [place]

## 2017-06-07 NOTE — Progress Notes (Addendum)
Cardiovascular and Pulmonary Nurse Navigator Note  Attempted to provide education to patient and wife.  Patient sitting up in recliner and wife at bedside.  Patient falling asleep every time this RN started speaking.  Patient and wife both apologized.  Patient asked, "Is it an inconvenience to ask you to come back at another time?  I am interested in what you have to say."  I will round on patient later today or in the morning.   Dietitian Consultation entered for diet education.  Patient has scales.   "Living Better with Heart Failure" booklet left at bedside along with handouts on low sodium heart healthy diet; list of sodium content of foods; brochure on [facility].     Roanna Epley, RN, BSN, [facility] Cardiovascular and Pulmonary Nurse Navigator

## 2017-06-07 NOTE — Care Management Important Message (Signed)
Copy of signed IM left in patient's room.    

## 2017-06-07 NOTE — Evaluation (Signed)
Physical Therapy Evaluation Patient Details Name: Norman Morales MRN: [id number] DOB: [birth year] Today's Date: 06/07/2017   History of Present Illness  Pt is an 84 y.o. male presenting to hospital with SOB and weight gain.  Pt admitted with acute on chronic diastolic CHF, acute on chronic respiratory failure d/t CHF, epistaxis, and anasarca.  PMH includes diastolic CHF, a-fib, CKD stage 3, htn, B TKR, chronic LE edema.  Clinical Impression  Prior to hospital admission, pt was modified independent ambulating with 4ww; on 2 L home O2 PRN.  Pt lives with his wife in 1 level home with 1 step to enter B railings.  Currently pt is SBA with transfers and CGA to SBA with ambulation in hallway with 4ww.  O2 sats (on 3 L O2 via nasal cannual throughout session) 90% at rest beginning of session with HR 77-83 bpm; O2 sats 86-87% post ambulation with HR 112 bpm; and O2 sats 93% with HR in 90's end of session resting in chair (nursing notified of above vitals).  Pt would benefit from skilled PT to address noted impairments and functional limitations (see below for any additional details).  Upon hospital discharge, recommend pt discharge to home with support of family; pt would benefit from OP Pulmonary Rehab.    Follow Up Recommendations Other (comment)(pt would benefit from OP Pulmonary Rehab)    Equipment Recommendations  Other (comment)(4ww (pt already has 4ww))    Recommendations for Other Services       Precautions / Restrictions Precautions Precautions: Fall Restrictions Weight Bearing Restrictions: No      Mobility  Bed Mobility               General bed mobility comments: Deferred (pt up in chair beginning and end of session)  Transfers Overall transfer level: Needs assistance Equipment used: Rolling walker (2 wheeled) Transfers: Sit to/from Stand Sit to Stand: Supervision         General transfer comment: mild increased effort to stand but  steady  Ambulation/Gait Ambulation/Gait assistance: Min guard;Supervision Ambulation Distance (Feet): (40 feet; 180 feet) Assistive device: 4-wheeled walker Gait Pattern/deviations: Step-through pattern   Gait velocity interpretation: <1.31 ft/sec, indicative of household ambulator General Gait Details: steady with 4ww; increased SOB with distance; therapist requested 1 sitting rest break from pt after 40 feet of ambulation in order to obtain O2 sats  Stairs            Wheelchair Mobility    Modified Rankin (Stroke Patients Only)       Balance Overall balance assessment: No apparent balance deficits (not formally assessed)(normal sitting balance; no loss of balance with ambulation using 4ww)                                           Pertinent Vitals/Pain Pain Assessment: 0-10 Pain Score: 4  Pain Location: B feet Pain Descriptors / Indicators: Discomfort Pain Intervention(s): Limited activity within patient's tolerance;Monitored during session;Repositioned    Home Living Family/patient expects to be discharged to:: Private residence Living Arrangements: Spouse/significant other Available Help at Discharge: Family Type of Home: House Home Access: Stairs to enter Entrance Stairs-Rails: Right;Left;Can reach both Technical brewer of Steps: 1 Home Layout: One level Home Equipment: Walker - 4 wheels      Prior Function Level of Independence: Independent with assistive device(s)         Comments: Pt ambulates with  4ww.  Reports no falls in past 6 months.     Hand Dominance        Extremity/Trunk Assessment   Upper Extremity Assessment Upper Extremity Assessment: Generalized weakness    Lower Extremity Assessment Lower Extremity Assessment: Generalized weakness       Communication   Communication: No difficulties  Cognition Arousal/Alertness: Awake/alert Behavior During Therapy: WFL for tasks assessed/performed Overall  Cognitive Status: Within Functional Limits for tasks assessed                                        General Comments General comments (skin integrity, edema, etc.): Pt sitting up in chair upon PT arrival; pt's wife present.  Nursing cleared pt for participation in physical therapy.  Pt agreeable to PT session.    Exercises  Ambulation; education on pacing with activities   Assessment/Plan    PT Assessment Patient needs continued PT services  PT Problem List Decreased strength;Decreased activity tolerance;Decreased mobility;Cardiopulmonary status limiting activity       PT Treatment Interventions DME instruction;Gait training;Stair training;Functional mobility training;Therapeutic activities;Therapeutic exercise;Balance training;Patient/family education    PT Goals (Current goals can be found in the Care Plan section)  Acute Rehab PT Goals Patient Stated Goal: to go home PT Goal Formulation: With patient Time For Goal Achievement: 06/21/17 Potential to Achieve Goals: Good    Frequency Min 2X/week   Barriers to discharge        Co-evaluation               AM-PAC PT "6 Clicks" Daily Activity  Outcome Measure Difficulty turning over in bed (including adjusting bedclothes, sheets and blankets)?: A Little Difficulty moving from lying on back to sitting on the side of the bed? : A Little Difficulty sitting down on and standing up from a chair with arms (e.g., wheelchair, bedside commode, etc,.)?: A Little Help needed moving to and from a bed to chair (including a wheelchair)?: A Little Help needed walking in hospital room?: A Little Help needed climbing 3-5 steps with a railing? : A Little 6 Click Score: 18    End of Session Equipment Utilized During Treatment: Gait belt;Oxygen(3 L O2 via nasal cannula) Activity Tolerance: Patient tolerated treatment well Patient left: in chair;with call bell/phone within reach;with family/visitor present(pt declined  chair alarm (nursing notified)) Nurse Communication: Mobility status;Precautions;Other (comment)(Pt's O2 and HR vitals during session) PT Visit Diagnosis: Other abnormalities of gait and mobility (R26.89);Muscle weakness (generalized) (M62.81)    Time: 6384-6659 PT Time Calculation (min) (ACUTE ONLY): 30 min   Charges:   PT Evaluation $PT Eval Low Complexity: 1 Low PT Treatments $Therapeutic Exercise: 8-22 mins   PT G CodesLeitha Bleak, PT 06/07/17, 10:06 AM [phone]

## 2017-06-08 LAB — CBC
HEMATOCRIT: 38 % — AB (ref 40.0–52.0)
Hemoglobin: 12.3 g/dL — ABNORMAL LOW (ref 13.0–18.0)
MCH: 30.8 pg (ref 26.0–34.0)
MCHC: 32.3 g/dL (ref 32.0–36.0)
MCV: 95.4 fL (ref 80.0–100.0)
PLATELETS: 185 10*3/uL (ref 150–440)
RBC: 3.99 MIL/uL — AB (ref 4.40–5.90)
RDW: 19.1 % — AB (ref 11.5–14.5)
WBC: 8.1 10*3/uL (ref 3.8–10.6)

## 2017-06-08 LAB — BASIC METABOLIC PANEL
ANION GAP: 8 (ref 5–15)
BUN: 42 mg/dL — ABNORMAL HIGH (ref 6–20)
CALCIUM: 9 mg/dL (ref 8.9–10.3)
CO2: 36 mmol/L — ABNORMAL HIGH (ref 22–32)
Chloride: 92 mmol/L — ABNORMAL LOW (ref 101–111)
Creatinine, Ser: 1.21 mg/dL (ref 0.61–1.24)
GFR, EST AFRICAN AMERICAN: 59 mL/min — AB (ref >60)
GFR, EST NON AFRICAN AMERICAN: 51 mL/min — AB (ref >60)
GLUCOSE: 140 mg/dL — AB (ref 65–99)
POTASSIUM: 4.5 mmol/L (ref 3.5–5.1)
Sodium: 136 mmol/L (ref 135–145)

## 2017-06-08 MED ORDER — PREMIER PROTEIN SHAKE
11.0000 [oz_av] | Freq: Two times a day (BID) | ORAL | Status: DC
  Administered 2017-06-09: 11 [oz_av] via ORAL

## 2017-06-08 MED ORDER — MUPIROCIN 2 % EX OINT
TOPICAL_OINTMENT | Freq: Two times a day (BID) | CUTANEOUS | Status: DC
  Administered 2017-06-08: 12:00:00 via NASAL
  Filled 2017-06-08: qty 22

## 2017-06-08 MED ORDER — AYR SALINE NASAL NA GEL
1.0000 "application " | Freq: Four times a day (QID) | NASAL | Status: DC
  Filled 2017-06-08: qty 14.1

## 2017-06-08 MED ORDER — SALINE SPRAY 0.65 % NA SOLN
1.0000 | Freq: Four times a day (QID) | NASAL | Status: DC
  Administered 2017-06-08 – 2017-06-10 (×8): 1 via NASAL
  Filled 2017-06-08: qty 44

## 2017-06-08 NOTE — Progress Notes (Signed)
Pt refusing chair alarm but was educated about safety.

## 2017-06-08 NOTE — Progress Notes (Signed)
Patient Name: Norman Morales Date of Encounter: 06/08/2017  Hospital Problem List     Active Problems:   CHF (congestive heart failure) [facility])    Patient Profile     84 yo with history of cad s/p cabg afib on eliquis with persistant lower extremety edema and diastolic chf.   Subjective   Still edematous. Unna boots and complression device in place on both lower extremities.   Inpatient Medications    . allopurinol  100 mg Oral Daily  . apixaban  5 mg Oral BID  . atorvastatin  80 mg Oral QHS  . bacitracin  1 application Topical TID  . furosemide  80 mg Intravenous BID  . gabapentin  300 mg Oral BID  . metoprolol tartrate  50 mg Oral BID  . nystatin   Topical BID  . polyethylene glycol  17 g Oral Daily  . sodium chloride flush  3 mL Intravenous Q12H  . spironolactone  25 mg Oral Daily  . [START ON 06/10/2017] umeclidinium-vilanterol  1 puff Inhalation Daily    Vital Signs    Vitals:   06/07/17 1921 06/07/17 2304 06/08/17 0407 06/08/17 0728  BP: (!) 154/73  135/87 136/74  Pulse: 85 90 68 84  Resp: 18 20 18 18   Temp: 97.8 F (36.6 C)  98.2 F (36.8 C) 98 F (36.7 C)  TempSrc: Oral   Oral  SpO2: 90% 92% 93% 91%  Weight:   125 kg (275 lb 8 oz) 124.8 kg (275 lb 1.6 oz)  Height:        Intake/Output Summary (Last 24 hours) at 06/08/2017 0842 Last data filed at 06/08/2017 0521 Gross per 24 hour  Intake 960 ml  Output 1400 ml  Net -440 ml   Filed Weights   06/07/17 0356 06/08/17 0407 06/08/17 0728  Weight: 116.6 kg (257 lb) 125 kg (275 lb 8 oz) 124.8 kg (275 lb 1.6 oz)    Physical Exam    GEN: Well nourished, well developed, in no acute distress.  HEENT: normal.  Neck: Supple, no JVD, carotid bruits, or masses. Cardiac: irr, irr, no murmurs, rubs, or gallops.4+ ;pwer extremity edema.  Respiratory:  Respirations regular and unlabored, clear to auscultation bilaterally. GI: Soft, nontender, nondistended, BS + x 4. MS: no deformity or atrophy. Skin: warm and dry,  no rash. Neuro:  Strength and sensation are intact. Psych: Normal affect.  Labs    CBC Recent Labs    06/08/17 0643  WBC 8.1  HGB 12.3*  HCT 38.0*  MCV 95.4  PLT 893   Basic Metabolic Panel Recent Labs    06/07/17 0518 06/08/17 0643  NA 136 136  K 4.2 4.5  CL 92* 92*  CO2 35* 36*  GLUCOSE 110* 140*  BUN 46* 42*  CREATININE 1.20 1.21  CALCIUM 8.9 9.0   Liver Function Tests No results for input(s): AST, ALT, ALKPHOS, BILITOT, PROT, ALBUMIN in the last 72 hours. No results for input(s): LIPASE, AMYLASE in the last 72 hours. Cardiac Enzymes No results for input(s): CKTOTAL, CKMB, CKMBINDEX, TROPONINI in the last 72 hours. BNP No results for input(s): BNP in the last 72 hours. D-Dimer No results for input(s): DDIMER in the last 72 hours. Hemoglobin A1C No results for input(s): HGBA1C in the last 72 hours. Fasting Lipid Panel No results for input(s): CHOL, HDL, LDLCALC, TRIG, CHOLHDL, LDLDIRECT in the last 72 hours. Thyroid Function Tests No results for input(s): TSH, T4TOTAL, T3FREE, THYROIDAB in the last 72 hours.  Invalid input(s): FREET3  Telemetry    afib  ECG    afib  Radiology    Dg Chest 2 View  Result Date: 06/06/2017 CLINICAL DATA:  84 year old male with shortness of breath. EXAM: CHEST - 2 VIEW COMPARISON:  Chest x-ray 12/25/2016. FINDINGS: There is cephalization of the pulmonary vasculature and slight indistinctness of the interstitial markings suggestive of mild pulmonary edema. Lung volumes are low. No pleural effusions. Heart size is mildly enlarged. The patient is rotated to the right on today's exam, resulting in distortion of the mediastinal contours and reduced diagnostic sensitivity and specificity for mediastinal pathology. Atherosclerosis in the thoracic aorta. Status post median sternotomy for CABG. Multiple broken median sternotomy wires are noted. IMPRESSION: 1. The appearance the chest suggests mild congestive heart failure, as above. 2.  Aortic atherosclerosis. Electronically Signed   By: Vinnie Langton M.D.   On: 06/06/2017 11:43   US Abdomen Complete  Result Date: 06/05/2017 CLINICAL DATA:  Cirrhosis. EXAM: ABDOMEN ULTRASOUND COMPLETE COMPARISON:  CT abdomen pelvis dated September 20, 2015. FINDINGS: Gallbladder: Single gallstone noted. No wall thickening. No sonographic Murphy sign noted by sonographer. Common bile duct: Diameter: 6 mm, normal. Liver: No focal lesion identified. Slightly nodular contour. Increased in parenchymal echogenicity. Portal vein is patent on color Doppler imaging with normal direction of blood flow towards the liver. IVC: No abnormality visualized. Pancreas: Visualized portion unremarkable. Spleen: Size and appearance within normal limits. Right Kidney: Length: 12.7 cm. Echogenicity within normal limits. No mass or hydronephrosis visualized. Left Kidney: Length: 13.9 cm. Echogenicity within normal limits. No mass or hydronephrosis visualized. Abdominal aorta: No aneurysm visualized. Other findings: None. IMPRESSION: 1. Slightly nodular liver contour, consistent with history of cirrhosis. 2. Cholelithiasis. Electronically Signed   By: Titus Dubin M.D.   On: 06/05/2017 17:38   US Abdomen Limited  Result Date: 06/01/2017 CLINICAL DATA:  Abdominal distension.  Ascites search EXAM: LIMITED ABDOMEN ULTRASOUND FOR ASCITES TECHNIQUE: Limited ultrasound survey for ascites was performed in all four abdominal quadrants. COMPARISON:  None. FINDINGS: No ascites noted in the abdomen or pelvis. IMPRESSION: No visible ascites. Electronically Signed   By: Rolm Baptise M.D.   On: 06/01/2017 09:13    Assessment & Plan    Diastolic heart failure-reserved lv function. No ascites. 4+ peripheral edema. On 80 mg iv lasix bid with nearly 7 liters of diuresis. Wt 124.785 kg this am. Will recheck weights and continue with iv diuresis. Consideration for lasix drip could be made if response to iv lasix is subopitmal. Will follow  electrolytes.   Signed, Javier Docker Fath MD 06/08/2017, 8:42 AM  Pager: [phone]

## 2017-06-08 NOTE — Progress Notes (Addendum)
[facility]ales at [facility] NAME: Norman Morales    MR#:  [id number]  DATE OF BIRTH:  August 01, 1926  SUBJECTIVE:  CHIEF COMPLAINT:  No chief complaint on file.  - net negative by almost 7 Liters since admission- but no significant change in weight noted. -Starts to have some more bleeding through left nostril again.  Eliquis restarted yesterday  REVIEW OF SYSTEMS:  Review of Systems  Constitutional: Positive for malaise/fatigue. Negative for chills and fever.  HENT: Positive for nosebleeds. Negative for hearing loss.   Respiratory: Positive for cough and shortness of breath. Negative for wheezing.   Cardiovascular: Positive for leg swelling. Negative for chest pain and palpitations.  Gastrointestinal: Positive for abdominal pain. Negative for constipation, diarrhea, nausea and vomiting.  Genitourinary: Negative for dysuria.  Musculoskeletal: Negative for myalgias.  Neurological: Negative for dizziness, focal weakness, seizures, weakness and headaches.  Psychiatric/Behavioral: Negative for depression.    DRUG ALLERGIES:  No Known Allergies  VITALS:  Blood pressure 136/74, pulse 84, temperature 98 F (36.7 C), temperature source Oral, resp. rate 18, height 6\' 1"  (1.854 m), weight 125.1 kg (275 lb 11.2 oz), SpO2 91 %.  PHYSICAL EXAMINATION:  Physical Exam  GENERAL:  84 y.o.-year-old patient lying in the bed with no acute distress.  EYES: Pupils equal, round, reactive to light and accommodation. No scleral icterus. Extraocular muscles intact.  HEENT: Head atraumatic, normocephalic. Oropharynx  clear.  Left nare packing NECK:  Supple, no jugular venous distention. No thyroid enlargement, no tenderness.  LUNGS: Normal breath sounds bilaterally, no wheezing, rhonchi or crepitation.  Fine bibasilar crackles heard no use of accessory muscles of respiration.  CARDIOVASCULAR: S1, S2 normal. No  rubs, or gallops. 2/6 systolic murmur present ABDOMEN:  Soft, nontender, very distended. Bowel sounds present. No organomegaly or mass.  EXTREMITIES: No  cyanosis, or clubbing.  2-3+ type pitting edema of both lower extremities with blisters. NEUROLOGIC: Cranial nerves II through XII are intact. Muscle strength 5/5 in all extremities. Sensation intact. Gait not checked.  PSYCHIATRIC: The patient is alert and oriented x 3.  SKIN: No obvious rash, lesion, or ulcer.      LABORATORY PANEL:   CBC Recent Labs  Lab 06/08/17 0643  WBC 8.1  HGB 12.3*  HCT 38.0*  PLT 185   ------------------------------------------------------------------------------------------------------------------  Chemistries  Recent Labs  Lab 06/04/17 1317  06/08/17 0643  NA 136   < > 136  K 4.3   < > 4.5  CL 93*   < > 92*  CO2 37*   < > 36*  GLUCOSE 127*   < > 140*  BUN 46*   < > 42*  CREATININE 1.35*   < > 1.21  CALCIUM 8.8*   < > 9.0  MG 2.3  --   --   AST 22  --   --   ALT 11*  --   --   ALKPHOS 105  --   --   BILITOT 1.1  --   --    < > = values in this interval not displayed.   ------------------------------------------------------------------------------------------------------------------  Cardiac Enzymes No results for input(s): TROPONINI in the last 168 hours. ------------------------------------------------------------------------------------------------------------------  RADIOLOGY:  No results found.  EKG:  No orders found for this or any previous visit.  ASSESSMENT AND PLAN:   84 y/o male with past medical history significant for diastolic CHF, A. fib, CKD stage III presents to hospital secondary to worsening weight gain and anasarca and  respiratory distress.  1. Acute hypoxic respiratory failure-secondary to acute on chronic diastolic heart failure. -Patient was using 2 L oxygen as needed at home,now on 3L o2  -Chest x-ray with pulmonary edema.  Torsemide was recently increased as outpatient with not much benefit. -IV Lasix twice  daily.  Continue IV Lasix for atleast 1 more day , weight getting close to dry weight, net negative by 6L since adm -Continue supplemental oxygen for now. Wean as tolerated -Daily weights, strict input and output monitoring -Appreciate cardiology consult  2. Epistaxis-has epistaxis episodes which are spontaneously resolved at home. -Appreciate ENT consult.  Left septum was cauterized and antibiotic ointment for 2 weeks recommended.  Also saline gel ordered. - eliquis restarted-minimal epistaxis again today.  Continue to monitor closely  3. Anasarca- on IV lasix for now-if needed will change to Lasix drip - leg edema- unna wraps with wound care consult US abdomen showing signs of early cirrhosis, no significant ascites seen  4. CKD stage 3- stable for now, monitor while on IV lasix  5. Afib- rate controlled, on metoprolol On eliquis   6. HTN- on lasix, metoprolol, aldactone  7. DVT Prophylaxis- eliquis    PT consult - needs outpatient PT at discharge Updated wife  at bedside Encourage ambulation today   All the records are reviewed and case discussed with Care Management/Social Workerr. Management plans discussed with the patient, family and they are in agreement.  CODE STATUS: DNR  TOTAL TIME TAKING CARE OF THIS PATIENT: 37 minutes.   POSSIBLE D/C IN 1-2 DAYS, DEPENDING ON CLINICAL CONDITION.   Norman Morales M.D on 06/08/2017 at 10:11 AM  Between 7am to 6pm - Pager - [phone]  After 6pm go to [email or web address] - password [email or web address] [facility]  Office  [phone]  CC: Primary care physician; Norman Hector, MD

## 2017-06-08 NOTE — Plan of Care (Signed)
Nutrition Education Note  RD consulted for nutrition education regarding new onset CHF.    84 y/o male with past medical history significant for diastolic CHF, A. fib, CKD stage III presents to hospital secondary to worsening weight gain and anasarca and respiratory distress.  Met with pt in room today. Pt reports good appetite and oral intake at baseline. Per chart, pt with wt gain secondary to CHF. RD will add Premier Protein BID to help pt meet his estimated needs, each supplement provides 160 kcal and 30 grams of protein.   RD provided "Low Sodium Nutrition Therapy" handout from the Academy of [employer]. Reviewed patient's dietary recall. Provided examples on ways to decrease sodium intake in diet. Discouraged intake of processed foods and use of salt shaker. Encouraged fresh fruits and vegetables as well as whole grain sources of carbohydrates to maximize fiber intake.   RD discussed why it is important for patient to adhere to diet recommendations, and emphasized the role of fluids, foods to avoid, and importance of weighing self daily. Teach back method used.  Expect fair compliance.  Body mass index is 36.37 kg/m. Pt meets criteria for obesity based on current BMI.  Current diet order is 2 gram, patient is consuming approximately 100% of meals at this time. Labs and medications reviewed. No further nutrition interventions warranted at this time. RD contact information provided. If additional nutrition issues arise, please re-consult RD.   Koleen Distance MS, RD, LDN Pager #- [phone] After Hours Pager: [phone]

## 2017-06-08 NOTE — Progress Notes (Addendum)
Cardiovascular and Pulmonary Nurse Navigator Note  84 year old male with PMH of diastolic CHF, A-fib, CKD Stage III who presented to the ED with worsening weight gain and anasarca with respiratory distress.  BNP 328.    Study Result      ------------------------------------------------------------------- Transthoracic Echocardiography  Patient:    Norman Morales, Norman Morales MR #:       [id number] Study Date: 06/04/2017 Gender:     M Age:        33 Height:     185.4 cm Weight:     124.8 kg BSA:        2.58 m^2 Pt. Status: Room:   ADMITTING    Sudini, Srikar R  ATTENDING    Sudini, Srikar R  ORDERING     Sudini, Srikar R  REFERRING    [place], Artist R  SONOGRAPHER  Tikeshia Stills RDCS  PERFORMING   [place], [facility]  cc:  ------------------------------------------------------------------- LV EF: 60% -   65%  ------------------------------------------------------------------- Indications:      CHF 428.18.  ------------------------------------------------------------------- Study Conclusions  - Procedure narrative: Transthoracic echocardiography. Image   quality was poor. The study was technically difficult, as a   result of body habitus. - Left ventricle: Wall thickness was increased in a pattern of mild   LVH. Systolic function was normal. The estimated ejection   fraction was in the range of 60% to 65%. - Left atrium: The atrium was mildly dilated. - Right ventricle: The cavity size was mildly dilated. - Right atrium: The atrium was mildly dilated.     Active Problem List this admission:   1. Acute hypoxic respiratory failure secondary to chronic diastolic heart failure.  Patient on chronic oxygen.  CXR revealed pulmonary edema.  Discussion today about lasix gtt.   2. Epitaxis  3. Anasarca - Considering Lasix gtt.  Unna wraps in place.   4. CKD stage III 5. Afib - rate controlled, on metoprolol. 6. HTN - on lasix, metoprolol, aldactone.    CHF  Education:?? Educational session with patient and wife completed.  Provided patient with "Living Better with Heart Failure" packet. Briefly reviewed definition of heart failure and signs and symptoms of an exacerbation.?Explained to patient that HF is a chronic illness which requires self-assessment / self-management along with help from the cardiologist/PCP.?? ? *Reviewed importance of and reason behind checking weight daily in the AM, after using the bathroom, but before getting dressed. Patient has scales.  ? *Reviewed with patient the following information: *Discussed when to call the Dr= weight gain of >2-3lb overnight of 5lb in a week,  *Discussed yellow zone= call MD: weight gain of >2-3lb overnight of 5lb in a week, increased swelling, increased SOB when lying down, chest discomfort, dizziness, increased fatigue *Red Zone= call 911: struggle to breath, fainting or near fainting, significant chest pain   *Diet - Reviewed low sodium diet-provided handout of recommended and not recommended foods.  Patient reports that he has not used a salt shaker in 50 years.  Wife spoke up and stated there is sodium in the foods we eat however.  Discussed foods high in sodium. Handout on the sodium content of foods provided to patient as well as information on low sodium heart healthy diet.  Dietitian Consultation entered yesterday for diet education.   ? *Discussed fluid restricitions with patient as well. Patient on 1500 ml fluid restriction today.?Stessed this fluid restriction includes all fluids, liquids, soups.  Patient seemed surprised by this.   ? *Instructed patient to  take medications as prescribed for heart failure. Explained briefly why pt is on the medications (either make you feel better, live longer or keep you out of the hospital) and discussed monitoring and side effects.  ? *Discussed exercise. Patient encourages to remain as active as possible.  Plan for outpatient PT as discharge.    ? *Smoking Cessation- Patient is a former smoker.? ? *[facility] - Explained the purpose of the HF Clinic. ?Explained to patient the HF Clinic does not replace PCP nor Cardiologist, but is an additional resource to helping patient manage heart failure at home. Appointment scheduled for 06/15/2017 at 3:40 p.m.   ? Again, the 5 Steps to Living Better with Heart Failure were reviewed with patient.   ? Patient thanked me for providing the above information. ? ? Roanna Epley, RN, BSN, [facility]? [place] [facility]  Cardiovascular &?Pulmonary Nurse Navigator  Direct Line: [phone]  Department Phone #: [phone] Fax: [phone]? Email Address: [email or web address]

## 2017-06-09 ENCOUNTER — Inpatient Hospital Stay: Payer: Medicare Other

## 2017-06-09 LAB — BASIC METABOLIC PANEL
Anion gap: 8 (ref 5–15)
BUN: 38 mg/dL — ABNORMAL HIGH (ref 6–20)
CALCIUM: 8.7 mg/dL — AB (ref 8.9–10.3)
CHLORIDE: 91 mmol/L — AB (ref 101–111)
CO2: 33 mmol/L — ABNORMAL HIGH (ref 22–32)
CREATININE: 1.17 mg/dL (ref 0.61–1.24)
GFR, EST NON AFRICAN AMERICAN: 53 mL/min — AB (ref >60)
Glucose, Bld: 110 mg/dL — ABNORMAL HIGH (ref 65–99)
Potassium: 4.4 mmol/L (ref 3.5–5.1)
SODIUM: 132 mmol/L — AB (ref 135–145)

## 2017-06-09 LAB — GLUCOSE, CAPILLARY: GLUCOSE-CAPILLARY: 132 mg/dL — AB (ref 65–99)

## 2017-06-09 MED ORDER — FUROSEMIDE 10 MG/ML IJ SOLN
60.0000 mg | Freq: Once | INTRAMUSCULAR | Status: AC
  Administered 2017-06-09: 60 mg via INTRAVENOUS
  Filled 2017-06-09: qty 6

## 2017-06-09 NOTE — Discharge Instructions (Signed)

## 2017-06-09 NOTE — Plan of Care (Signed)
  Problem: Activity: Goal: Risk for activity intolerance will decrease Outcome: Progressing   Problem: Skin Integrity: Goal: Risk for impaired skin integrity will decrease Outcome: Progressing   

## 2017-06-09 NOTE — Care Management Important Message (Signed)
Copy of signed IM left in patient's room.    

## 2017-06-09 NOTE — Plan of Care (Signed)
  Problem: Clinical Measurements: Goal: Cardiovascular complication will be avoided Outcome: Progressing   Problem: Activity: Goal: Risk for activity intolerance will decrease Outcome: Progressing   Problem: Elimination: Goal: Will not experience complications related to urinary retention Outcome: Progressing   Problem: Safety: Goal: Ability to remain free from injury will improve Outcome: Progressing   Problem: Skin Integrity: Goal: Risk for impaired skin integrity will decrease Outcome: Progressing   Problem: Cardiac: Goal: Ability to achieve and maintain adequate cardiopulmonary perfusion will improve Outcome: Progressing

## 2017-06-09 NOTE — Progress Notes (Signed)
[place] [facility] [place] NAME: Norman Morales    MR#:  [id number]  DATE OF BIRTH:  07-24-1926  SUBJECTIVE:  CHIEF COMPLAINT:  No chief complaint on file.  - net negative by almost 8.5 Liters since admission- but no significant change in weight noted. --Feels some better.  Refuses to have home health physical therapy.  REVIEW OF SYSTEMS:  Review of Systems  Constitutional: Positive for malaise/fatigue. Negative for chills and fever.  HENT: Positive for nosebleeds. Negative for hearing loss.   Respiratory: Positive for cough and shortness of breath. Negative for wheezing.   Cardiovascular: Positive for leg swelling. Negative for chest pain and palpitations.  Gastrointestinal: Positive for abdominal pain. Negative for constipation, diarrhea, nausea and vomiting.  Genitourinary: Negative for dysuria.  Musculoskeletal: Negative for myalgias.  Neurological: Negative for dizziness, focal weakness, seizures, weakness and headaches.  Psychiatric/Behavioral: Negative for depression.    DRUG ALLERGIES:  No Known Allergies  VITALS:  Blood pressure 137/60, pulse 97, temperature 97.9 F (36.6 C), temperature source Oral, resp. rate 16, height 6\' 1"  (1.854 m), weight 125.2 kg (276 lb 1.6 oz), SpO2 92 %.  PHYSICAL EXAMINATION:  Physical Exam  GENERAL:  84 y.o.-year-old patient lying in the bed with no acute distress.  EYES: Pupils equal, round, reactive to light and accommodation. No scleral icterus. Extraocular muscles intact.  HEENT: Head atraumatic, normocephalic. Oropharynx  clear.  Left nare packing NECK:  Supple, no jugular venous distention. No thyroid enlargement, no tenderness.  LUNGS: Normal breath sounds bilaterally, no wheezing, rhonchi or crepitation.  Fine bibasilar crackles heard no use of accessory muscles of respiration.  CARDIOVASCULAR: S1, S2 normal. No  rubs, or gallops. 2/6 systolic murmur present ABDOMEN: Soft, nontender, very  distended. Bowel sounds present. No organomegaly or mass.  EXTREMITIES: No  cyanosis, or clubbing.  2-3+ type pitting edema of both lower extremities with blisters. NEUROLOGIC: Cranial nerves II through XII are intact. Muscle strength 5/5 in all extremities. Sensation intact. Gait not checked.  PSYCHIATRIC: The patient is alert and oriented x 3.  SKIN: No obvious rash, lesion, or ulcer.      LABORATORY PANEL:   CBC Recent Labs  Lab 06/08/17 0643  WBC 8.1  HGB 12.3*  HCT 38.0*  PLT 185   ------------------------------------------------------------------------------------------------------------------  Chemistries  Recent Labs  Lab 06/04/17 1317  06/09/17 0608  NA 136   < > 132*  K 4.3   < > 4.4  CL 93*   < > 91*  CO2 37*   < > 33*  GLUCOSE 127*   < > 110*  BUN 46*   < > 38*  CREATININE 1.35*   < > 1.17  CALCIUM 8.8*   < > 8.7*  MG 2.3  --   --   AST 22  --   --   ALT 11*  --   --   ALKPHOS 105  --   --   BILITOT 1.1  --   --    < > = values in this interval not displayed.   ------------------------------------------------------------------------------------------------------------------  Cardiac Enzymes No results for input(s): TROPONINI in the last 168 hours. ------------------------------------------------------------------------------------------------------------------  RADIOLOGY:  No results found.  EKG:  No orders found for this or any previous visit.  ASSESSMENT AND PLAN:   84 y/o male with past medical history significant for diastolic CHF, A. fib, CKD stage III presents to hospital secondary to worsening weight gain and anasarca and respiratory distress.  1. Acute hypoxic respiratory failure-secondary to acute on chronic diastolic heart failure. -Stable on 2 L oxygen at this time -Chest x-ray with pulmonary edema. -IV Lasix twice daily.  Continue IV Lasix for atleast 1 more day ,net negative by 8L  since adm -Continue supplemental oxygen for now.    -Daily weights, strict input and output monitoring -Appreciate cardiology consult  2. Epistaxis-has epistaxis episodes which are spontaneously resolved at home. -Appreciate ENT consult.  Left septum was cauterized and antibiotic ointment for 2 weeks recommended.  Also saline gel ordered. - eliquis restarted-minimal epistaxis again.  Continue to monitor closely  3. Anasarca- on IV lasix for now-if needed will change to Lasix drip - leg edema- unna wraps with wound care consult- weekly change recommended US abdomen showing signs of early cirrhosis, no significant ascites seen  4. CKD stage 3- stable for now, monitor while on IV lasix  5. Afib- rate controlled, on metoprolol On eliquis   6. HTN- on lasix, metoprolol, aldactone  7. DVT Prophylaxis- eliquis    PT consult - needs outpatient PT at discharge Updated wife  at bedside Encourage ambulation    All the records are reviewed and case discussed with Care Management/Social Workerr. Management plans discussed with the patient, family and they are in agreement.  CODE STATUS: DNR  TOTAL TIME TAKING CARE OF THIS PATIENT: 36 minutes.   POSSIBLE D/C IN 1-2 DAYS, DEPENDING ON CLINICAL CONDITION.   Julionna Marczak M.D on 06/09/2017 at 1:40 PM  Between 7am to 6pm - Pager - [phone]  After 6pm go to [email or web address] - password [email or web address] [facility] [facility]  Office  [phone]  CC: Primary care physician; Adin Hector, MD

## 2017-06-09 NOTE — Consult Note (Signed)
WOC in for re-assessment of LEs. Unna's boots placed Monday 06/07/17.  Today I was in to make sure after compression for several days the compression wraps had not loosened.  They are intact and will need to be changed weekly. [facility] nurse will change weekly while inpatient. Will need HHRN for weekly changes at home.   [place], [place], [place]

## 2017-06-10 LAB — BASIC METABOLIC PANEL
ANION GAP: 8 (ref 5–15)
BUN: 41 mg/dL — ABNORMAL HIGH (ref 6–20)
CO2: 34 mmol/L — AB (ref 22–32)
Calcium: 8.7 mg/dL — ABNORMAL LOW (ref 8.9–10.3)
Chloride: 89 mmol/L — ABNORMAL LOW (ref 101–111)
Creatinine, Ser: 1.14 mg/dL (ref 0.61–1.24)
GFR calc non Af Amer: 55 mL/min — ABNORMAL LOW (ref >60)
Glucose, Bld: 130 mg/dL — ABNORMAL HIGH (ref 65–99)
Potassium: 4.4 mmol/L (ref 3.5–5.1)
SODIUM: 131 mmol/L — AB (ref 135–145)

## 2017-06-10 MED ORDER — TORSEMIDE 20 MG PO TABS: 40.0000 mg | ORAL_TABLET | Freq: Two times a day (BID) | ORAL | 1 refills | Status: AC

## 2017-06-10 MED ORDER — BACITRACIN ZINC 500 UNIT/GM EX OINT: 1.0000 "application " | TOPICAL_OINTMENT | Freq: Three times a day (TID) | CUTANEOUS | 0 refills | Status: AC

## 2017-06-10 MED ORDER — POLYETHYLENE GLYCOL 3350 17 G PO PACK: 17.0000 g | PACK | Freq: Every day | ORAL | 0 refills | Status: DC

## 2017-06-10 MED ORDER — IPRATROPIUM-ALBUTEROL 0.5-2.5 (3) MG/3ML IN SOLN: 3.0000 mL | Freq: Four times a day (QID) | RESPIRATORY_TRACT | 1 refills | Status: DC | PRN

## 2017-06-10 MED ORDER — ALLOPURINOL 100 MG PO TABS: 100.0000 mg | ORAL_TABLET | Freq: Every day | ORAL | 2 refills | Status: DC

## 2017-06-10 NOTE — Progress Notes (Signed)
Cardiovascular and Pulmonary Nurse Navigator Note:  84 year old male admitted to the hospital with dx of acute on chronic respiratory failure secondary to CHF.   PMHx:  Afib, adrenal ademoma, CHF, CAD, cervical DDD, HLD, HTN, lung nodule, and restrictive lung disease.  Echo performed on 06/04/2017 with EF 60-65%.    Patient has unna boots on bilateral lower extremities.  Patient will need HH services - RN, PT, Aide, with HF protocol upon discharge.    CHF Education:   Rounded on patient.  Patient sitting up in recliner chair. Wife at bedside.  This RN had previously rounded on patient the day before, but patient was too tired and could not stay awake to have a conversation.    CHF Education completed with patient and wife.   Provided patient with "Living Better with Heart Failure" packet. Briefly reviewed definition of heart failure and signs and symptoms of an exacerbation.?Explained to patient that HF is a chronic illness which requires self-assessment / self-management along with help from the cardiologist/PCP.?? ? *Reviewed importance of and reason behind checking weight daily in the AM, after using the bathroom, but before getting dressed. Patient has scales.  ? *Reviewed with patient the following information: *Discussed when to call the Dr= weight gain of >2-3lb overnight of 5lb in a week,  *Discussed yellow zone= call MD: weight gain of >2-3lb overnight of 5lb in a week, increased swelling, increased SOB when lying down, chest discomfort, dizziness, increased fatigue *Red Zone= call 911: struggle to breath, fainting or near fainting, significant chest pain   *Reviewed low sodium diet-provided handout of recommended and not recommended foods.?This RN entered order for Dietitian Consultation for diet education.    Patient informed me that he has not used the salt shaker for 50 years.  Wife spoke up and did say they order a particular meal from [place] from time-to-time that they enjoy.    Patient's wife further stated she realizes that eating this meal from [place] is not heart healthy.  ? *Discussed fluid intake with patient as well. Patient advised to drink no more than 1500 ml.  Informed patient then could changed depending on his fluid status.? ? *Instructed patient to take medications as prescribed for heart failure. Explained briefly why pt is on the medications (either make you feel better, live longer or keep you out of the hospital) and discussed monitoring and side effects.  ? *Discussed exercise. PT to evaluate patient.  Patient is on chronic oxygen at home.   ? *Smoking Cessation- Patient is a former smoker.? ? *[facility] - Explained the purpose of the HF Clinic. ?Explained to patient the HF Clinic does not replace PCP nor Cardiologist, but is an additional resource to helping patient manage heart failure at home.  Heart Failure Clinic appointment scheduled for 06/15/2017 at 3:40 p.m.   ? Again, the 5 Steps to Living Better with Heart Failure were reviewed with patient.  ? Patient / wife thanked me for providing the above information. ? ? Roanna Epley, RN, BSN, [facility]? [place] [facility]?P[facility] Rehab  Cardiovascular &?Pulmonary Nurse Navigator  Direct Line: [phone]  Department Phone #: [phone] Fax: [phone]? Email Address: [email or web address]

## 2017-06-10 NOTE — Progress Notes (Signed)
Patient has chronic bronchitis-he had occasional scattered wheezing during the hospital course.  Did not need systemic steroids. -Uses inhalers at home.  But will benefit from having a nebulizer

## 2017-06-10 NOTE — Progress Notes (Signed)
Discharge instructions explained to pt and pts spouse/ verbalized an understanding/ iv and tele removed/ transported off unit via wheelchair.  

## 2017-06-10 NOTE — Discharge Summary (Signed)
[facility] NAME: Norman Morales    MR#:  [id number]  DATE OF BIRTH:  13-Oct-1926  DATE OF ADMISSION:  06/04/2017   ADMITTING PHYSICIAN: Hillary Bow, MD  DATE OF DISCHARGE: 06/10/17  PRIMARY CARE PHYSICIAN: Tama High III, MD   ADMISSION DIAGNOSIS:   Acute on Chronic Respiratory Failure CHF  DISCHARGE DIAGNOSIS:   Active Problems:   CHF (congestive heart failure) ([place])   SECONDARY DIAGNOSIS:   Past Medical History:  Diagnosis Date  . A-fib ([place])   . Adrenal adenoma   . CHF (congestive heart failure) ([place])   . Coronary artery disease   . DDD (degenerative disc disease), cervical   . Hyperlipidemia   . Hypertension   . Lung nodule   . Restrictive lung disease     HOSPITAL COURSE:   84 y/o male with past medical history significant for diastolic CHF, A. fib, CKD stage III presents to hospital secondary to worsening weight gain and anasarca and respiratory distress.  1. Acute hypoxic respiratory failure-secondary to acute on chronic diastolic heart failure. -Stable on 2 L oxygen at this time -Chest x-ray with pulmonary edema on admission-- much improved now -Give the IV Lasix since admission, net negative by almost 11 L though no significant change has been noted in his weight. -Daily weights at home-Continue supplemental oxygen 2l continuous at home.  -Appreciate cardiology consult  2. Epistaxis-has had epistaxis episodes which are spontaneously resolved at home.worsened here. -Appreciate ENT consult.  Left septum was cauterized and antibiotic ointment for 2 weeks recommended.  Also saline gel ordered. - eliquis restarted- -doing well now.  Advised to keep the nose moist with saline space, avoid blowing nose too hard.  Discontinue aspirin for now  3. Anasarca- on IV lasix for now-well diuresed.  Discharged on torsemide.  Can add PRN metolazone at home as outpatient. - leg edema- unna wraps with wound care  consult- weekly change recommended US abdomen showing signs of early cirrhosis, no significant ascites seen  4. CKD stage 3- stable for now, sodium slowly dropping- stop IV lasix now  5. Afib- rate controlled, on metoprolol On eliquis   6. HTN- on lasix, metoprolol, aldactone  PT consult - home health at discharge Updated wife and daughter  at bedside Updated his PCP prior to discharge    DISCHARGE CONDITIONS:   Guarded  CONSULTS OBTAINED:   Treatment Team:  Clyde Canterbury, MD Teodoro Spray, MD  DRUG ALLERGIES:   No Known Allergies DISCHARGE MEDICATIONS:   Allergies as of 06/10/2017   No Known Allergies     Medication List    STOP taking these medications   aspirin EC 81 MG tablet   bumetanide 2 MG tablet Commonly known as:  BUMEX   ibuprofen 200 MG tablet Commonly known as:  ADVIL,MOTRIN     TAKE these medications   albuterol 108 (90 Base) MCG/ACT inhaler Commonly known as:  PROVENTIL HFA;VENTOLIN HFA Inhale 2 puffs into the lungs every 4 (four) hours as needed for wheezing or shortness of breath.   allopurinol 100 MG tablet Commonly known as:  ZYLOPRIM Take 1 tablet (100 mg total) by mouth daily. What changed:  See the new instructions.   ANORO ELLIPTA 62.5-25 MCG/INH Aepb Generic drug:  umeclidinium-vilanterol Inhale 1 puff into the lungs daily.   atorvastatin 80 MG tablet Commonly known as:  LIPITOR TAKE ONE TABLET AT BEDTIME   azelastine 0.1 % nasal spray Commonly known  as:  ASTELIN Place 1-2 sprays into the nose 2 (two) times daily as needed for rhinitis.   B-12 1000 MCG/ML Kit Inject 1,000 mcg as directed every 30 (thirty) days.   bacitracin ointment Apply 1 application topically 3 (three) times daily for 14 days. X 2 weeks   CoQ-10 200 MG Caps Take 200 mg by mouth every evening.   ELIQUIS 5 MG Tabs tablet Generic drug:  apixaban Take 5 mg by mouth 2 (two) times daily.   gabapentin 300 MG capsule Commonly known as:   NEURONTIN Take 300 mg by mouth 2 (two) times daily.   ipratropium-albuterol 0.5-2.5 (3) MG/3ML Soln Commonly known as:  DUONEB Take 3 mLs by nebulization every 6 (six) hours as needed (wheezing).   Melatonin 1 MG Tabs Take 1 mg by mouth daily as needed (sleep).   metoprolol tartrate 25 MG tablet Commonly known as:  LOPRESSOR Take 50 mg by mouth 2 (two) times daily.   polyethylene glycol packet Commonly known as:  MIRALAX / GLYCOLAX Take 17 g by mouth daily. Start taking on:  06/11/2017   spironolactone 25 MG tablet Commonly known as:  ALDACTONE Take 25 mg by mouth daily.   torsemide 20 MG tablet Commonly known as:  DEMADEX Take 2 tablets (40 mg total) by mouth 2 (two) times daily.   zolpidem 5 MG tablet Commonly known as:  AMBIEN Take 1 tablet (5 mg total) by mouth at bedtime as needed for sleep.            Durable Medical Equipment  (From admission, onward)        Start     Ordered   06/10/17 0951  For home use only DME Nebulizer machine  Once    Question:  Patient needs a nebulizer to treat with the following condition  Answer:  COPD (chronic obstructive pulmonary disease) ([place])   06/10/17 0951       DISCHARGE INSTRUCTIONS:   1. PCP f/u in 1 week 2. Cardiology f/u in 2 weeks 3. Strongly encouraged to use his CPAP at bedtime, fluid restriction to 1200cc/day and also salt restriction  DIET:   Cardiac diet  ACTIVITY:   Activity as tolerated  OXYGEN:   Home Oxygen: Yes.    Oxygen Delivery: 2 liters/min via Patient connected to nasal cannula oxygen  DISCHARGE LOCATION:   home   If you experience worsening of your admission symptoms, develop shortness of breath, life threatening emergency, suicidal or homicidal thoughts you must seek medical attention immediately by calling 911 or calling your MD immediately  if symptoms less severe.  You Must read complete instructions/literature along with all the possible adverse reactions/side effects for all  the Medicines you take and that have been prescribed to you. Take any new Medicines after you have completely understood and accpet all the possible adverse reactions/side effects.   Please note  You were cared for by a hospitalist during your hospital stay. If you have any questions about your discharge medications or the care you received while you were in the hospital after you are discharged, you can call the unit and asked to speak with the hospitalist on call if the hospitalist that took care of you is not available. Once you are discharged, your primary care physician will handle any further medical issues. Please note that NO REFILLS for any discharge medications will be authorized once you are discharged, as it is imperative that you return to your primary care physician (or establish a relationship  with a primary care physician if you do not have one) for your aftercare needs so that they can reassess your need for medications and monitor your lab values.    On the day of Discharge:  VITAL SIGNS:   Blood pressure 131/74, pulse 71, temperature 98 F (36.7 C), temperature source Oral, resp. rate 18, height 6' 1"  (1.854 m), weight 124.3 kg (274 lb), SpO2 93 %.  PHYSICAL EXAMINATION:   GENERAL:  84 y.o.-year-old patient lying in the bed with no acute distress.  EYES: Pupils equal, round, reactive to light and accommodation. No scleral icterus. Extraocular muscles intact.  HEENT: Head atraumatic, normocephalic. Oropharynx  clear.  Left nare packing NECK:  Supple, no jugular venous distention. No thyroid enlargement, no tenderness.  LUNGS: Normal breath sounds bilaterally, no wheezing, rhonchi or crepitation.  Fine bibasilar crackles heard no use of accessory muscles of respiration.  CARDIOVASCULAR: S1, S2 normal. No  rubs, or gallops. 2/6 systolic murmur present ABDOMEN: Soft, nontender, distended. Bowel sounds present. No organomegaly or mass.  EXTREMITIES: No  cyanosis, or clubbing.   2-3+ type pitting edema of both lower extremities with blisters. NEUROLOGIC: Cranial nerves II through XII are intact. Muscle strength 5/5 in all extremities. Sensation intact. Gait not checked.  PSYCHIATRIC: The patient is alert and oriented x 3.  SKIN: No obvious rash, lesion, or ulcer   DATA REVIEW:   CBC Recent Labs  Lab 06/08/17 0643  WBC 8.1  HGB 12.3*  HCT 38.0*  PLT 185    Chemistries  Recent Labs  Lab 06/04/17 1317  06/10/17 0639  NA 136   < > 131*  K 4.3   < > 4.4  CL 93*   < > 89*  CO2 37*   < > 34*  GLUCOSE 127*   < > 130*  BUN 46*   < > 41*  CREATININE 1.35*   < > 1.14  CALCIUM 8.8*   < > 8.7*  MG 2.3  --   --   AST 22  --   --   ALT 11*  --   --   ALKPHOS 105  --   --   BILITOT 1.1  --   --    < > = values in this interval not displayed.     Microbiology Results  Results for orders placed or performed during the hospital encounter of 12/23/16  Blood Culture (routine x 2)     Status: None   Collection Time: 12/23/16  4:53 AM  Result Value Ref Range Status   Specimen Description BLOOD  Final   Special Requests NONE  Final   Culture NO GROWTH 5 DAYS  Final   Report Status 12/28/2016 FINAL  Final  Blood Culture (routine x 2)     Status: None   Collection Time: 12/23/16  4:53 AM  Result Value Ref Range Status   Specimen Description BLOOD  Final   Special Requests NONE  Final   Culture NO GROWTH 5 DAYS  Final   Report Status 12/28/2016 FINAL  Final    RADIOLOGY:  Dg Chest 2 View  Result Date: 06/09/2017 CLINICAL DATA:  CHF, atrial fibrillation, coronary artery disease. Former smoker. EXAM: CHEST - 2 VIEW COMPARISON:  PA and lateral chest x-ray of Jun 06, 2017 and December 25, 2016. FINDINGS: The lungs are mildly hypoinflated. The interstitial markings are increased but less conspicuous than on the earlier study. Coarse lung markings at the left base are stable. The cardiac silhouette  remains enlarged. The pulmonary vascularity is less engorged. There  are multiple broken sternal wires from previous CABG. There is no pleural effusion. There is calcification in the wall of the thoracic aorta. IMPRESSION: Improving CHF. Persistent mild interstitial edema. Left basilar scarring, stable. Electronically Signed   By: David  Martinique M.D.   On: 06/09/2017 15:57     Management plans discussed with the patient, family and they are in agreement.  CODE STATUS:     Code Status Orders  (From admission, onward)        Start     Ordered   06/04/17 1415  Do not attempt resuscitation (DNR)  Continuous    Question Answer Comment  In the event of cardiac or respiratory ARREST Do not call a "code blue"   In the event of cardiac or respiratory ARREST Do not perform Intubation, CPR, defibrillation or ACLS   In the event of cardiac or respiratory ARREST Use medication by any route, position, wound care, and other measures to relive pain and suffering. May use oxygen, suction and manual treatment of airway obstruction as needed for comfort.      06/04/17 1414    Code Status History    Date Active Date Inactive Code Status Order ID Comments User Context   06/04/2017 2505 06/04/2017 1414 Full Code [id number]  Hillary Bow, MD Inpatient   12/23/2016 0806 12/27/2016 1617 Full Code [id number]  Lance Coon, MD Inpatient   12/23/2016 0714 12/23/2016 0806 Full Code [id number]  Hillary Bow, MD ED   12/30/2015 1429 12/30/2015 1859 Full Code [id number]  Algernon Huxley, MD Inpatient    Advance Directive Documentation     Most Recent Value  Type of Advance Directive  Healthcare Power of [place], Living will  Pre-existing out of facility DNR order (yellow form or pink MOST form)  -  "MOST" Form in Place?  -      TOTAL TIME TAKING CARE OF THIS PATIENT: 38 minutes.    Gladstone Lighter M.D on 06/10/2017 at 9:57 AM  Between 7am to 6pm - Pager - [phone]  After 6pm go to [email or web address] - password [email or web address] [facility]  [facility]  Office   [phone]  CC: Primary care physician; Adin Hector, MD   Note: This dictation was prepared with Dragon dictation along with smaller phrase technology. Any transcriptional errors that result from this process are unintentional.

## 2017-06-10 NOTE — Care Management Note (Signed)
Case Management Note  Patient Details  Name: Norman Morales MRN: [id number] Date of Birth: [birth year]   Patient to discharge home today.  Patient lives at home with wife.  PCP Caryl Comes.  Heart failure Clinic appointment scheduled for 06/15/17.  Patient has RW, Chronic O2, and CPAP in the home.  Home health orders have been placed for RN, PT, and aide.  Patient agreeable to home health services.  [facility] was chosen for agency.  Referral made to [facility] with [place].  Patient to pick up nebulizer at [facility] store after discharge.  Heart Failure protocol has been signed by MD, and provided to [place]. RNCM signing off.   Subjective/Objective:                    Action/Plan:   Expected Discharge Date:  06/10/17               Expected Discharge Plan:  [place]  In-House Referral:     Discharge planning Services  CM Consult  Post Acute Care Choice:  Durable Medical Equipment, Home Health Choice offered to:  Patient  DME Arranged:  Nebulizer machine DME Agency:  [facility] Arranged:  RN, PT, Nurse's Aide [place] Agency:  [place]  Status of Service:  Completed, signed off  If discussed at [place] of Stay Meetings, dates discussed:    Additional Comments:  Beverly Sessions, RN 06/10/2017, 11:18 AM

## 2017-06-12 NOTE — Progress Notes (Deleted)
   Patient ID: Norman Morales, male    DOB: [birth year], 84 y.o.   MRN: [id number]  HPI  Norman Morales is a 84 y/o male with a history of  Echo report from 06/04/17 reviewed and showed an EF of 60-65%.  Admitted 06/04/17 due to acute on chronic HF. Cardiology and ENT (due to epistasis) consults were obtained. Initially needed IV lasix and transitioned to oral diuretics. Lost ~ 11L of fluids. Discharged after 6 days with home PT.   Patient presents today for his initially visit with a chief complaint of   Review of Systems    Physical Exam  Assessment & Plan:  1: Chronic heart failure with preserved ejection fraction- - NYHA class - saw cardiology Nehemiah Massed) 03/30/17 - BNP 06/04/17 was 328.0  2: HTN- - saw PCP Caryl Comes) 06/04/17 - BMP 06/10/17 reviewed and showed sodium 131, potassium 4.4 and GFR 55  3: Atrial fibrillation-

## 2017-06-14 ENCOUNTER — Telehealth: Payer: Self-pay

## 2017-06-14 NOTE — Telephone Encounter (Signed)
Flagged on EMMI report for having questions about discharge papers and having other questions/problems.  Called and spoke with patient's wife, Norman Morales, as patient unavailable at time of call. Wife mentioned they do not have any questions about discharge papers at this time.  She relayed feedback that they felt they were slow to get attention after being admitted (said Dr. Caryl Comes wheeled patient over at 12pm, though didn't get his first dose of medications until after 4).  Other than that, wife relayed that nursing staff was wonderful, specifically recognizing Lovena Le and Dr. Tressia Miners.  They do not have any other questions or concerns at this time.  I thanked her for her time and feedback, as well as made her aware that Norman Morales would receive one more automated call in the next few days as a final check-in.

## 2017-06-15 ENCOUNTER — Ambulatory Visit: Payer: Medicare Other | Admitting: Family

## 2017-07-02 ENCOUNTER — Ambulatory Visit: Payer: Medicare Other | Admitting: Family

## 2017-07-09 ENCOUNTER — Ambulatory Visit: Payer: Medicare Other | Admitting: Family

## 2017-07-12 ENCOUNTER — Ambulatory Visit: Payer: Medicare Other | Attending: Family | Admitting: Family

## 2017-07-12 ENCOUNTER — Other Ambulatory Visit: Payer: Self-pay | Admitting: Family

## 2017-07-12 ENCOUNTER — Encounter: Payer: Self-pay | Admitting: Family

## 2017-07-12 VITALS — BP 149/84 | HR 76 | Resp 18 | Ht 72.0 in | Wt 266.2 lb

## 2017-07-12 DIAGNOSIS — Z87891 Personal history of nicotine dependence: Secondary | ICD-10-CM | POA: Diagnosis not present

## 2017-07-12 DIAGNOSIS — E785 Hyperlipidemia, unspecified: Secondary | ICD-10-CM | POA: Diagnosis not present

## 2017-07-12 DIAGNOSIS — I2510 Atherosclerotic heart disease of native coronary artery without angina pectoris: Secondary | ICD-10-CM | POA: Insufficient documentation

## 2017-07-12 DIAGNOSIS — Z7901 Long term (current) use of anticoagulants: Secondary | ICD-10-CM | POA: Insufficient documentation

## 2017-07-12 DIAGNOSIS — I5033 Acute on chronic diastolic (congestive) heart failure: Secondary | ICD-10-CM

## 2017-07-12 DIAGNOSIS — N183 Chronic kidney disease, stage 3 (moderate): Secondary | ICD-10-CM | POA: Insufficient documentation

## 2017-07-12 DIAGNOSIS — G4733 Obstructive sleep apnea (adult) (pediatric): Secondary | ICD-10-CM | POA: Insufficient documentation

## 2017-07-12 DIAGNOSIS — I10 Essential (primary) hypertension: Secondary | ICD-10-CM

## 2017-07-12 DIAGNOSIS — I130 Hypertensive heart and chronic kidney disease with heart failure and stage 1 through stage 4 chronic kidney disease, or unspecified chronic kidney disease: Secondary | ICD-10-CM | POA: Diagnosis present

## 2017-07-12 DIAGNOSIS — Z79899 Other long term (current) drug therapy: Secondary | ICD-10-CM | POA: Insufficient documentation

## 2017-07-12 DIAGNOSIS — I5032 Chronic diastolic (congestive) heart failure: Secondary | ICD-10-CM | POA: Insufficient documentation

## 2017-07-12 DIAGNOSIS — I480 Paroxysmal atrial fibrillation: Secondary | ICD-10-CM

## 2017-07-12 NOTE — Patient Instructions (Signed)
Continue weighing daily and call for an overnight weight gain of > 2 pounds or a weekly weight gain of >5 pounds. 

## 2017-07-12 NOTE — Progress Notes (Signed)
Patient ID: Norman Morales, male    DOB: May 29, 1926, 84 y.o.   MRN: [id number]  HPI  Norman Morales is a 84 yo male with a PMH of diastolic HF, Afib, CKD3, HTN, hyperlipidemia, obstructive sleep apnea, previous tobacco/ alcohol use and obesity.   ECHO 06/04/17 showed EF of 60-65%  Admitted 06/04/17 with c/o SOB and fatigue. During his stay he was given IV Lasix and lost ~ 11L. Cardiology was consulted during admit. Discharged after 6 days.   He presents today for an initial visit with a chief complaint of moderate SOB on moderate exertion. Upon exam, he has some abdominal distention and is holding onto some fluid in his abdomen. Patient also endorses chronic venous changes in his legs and has associated dermatologic changes and cellulitis. His PCP Dr. Caryl Comes recently switched his antibiotics from dicloxacillin to doxycycline and will follow up with the patient in 2 weeks. He denies any chest pain or palpitations.   Past Medical History:  Diagnosis Date  . A-fib ([place])   . Adrenal adenoma   . CHF (congestive heart failure) ([place])   . Coronary artery disease   . DDD (degenerative disc disease), cervical   . Hyperlipidemia   . Hypertension   . Lung nodule   . Restrictive lung disease    Past Surgical History:  Procedure Laterality Date  . CARDIAC SURGERY    . CATARACT EXTRACTION, BILATERAL    . CORONARY ARTERY BYPASS GRAFT    . EXPLORATION POST OPERATIVE OPEN HEART    . LUMBAR FUSION    . PERIPHERAL VASCULAR CATHETERIZATION Left 12/30/2015   Procedure: Lower Extremity Angiography;  Surgeon: Algernon Huxley, MD;  Location: [facility];  Service: Cardiovascular;  Laterality: Left;  . PERIPHERAL VASCULAR CATHETERIZATION  12/30/2015   Procedure: Lower Extremity Intervention;  Surgeon: Algernon Huxley, MD;  Location: [facility];  Service: Cardiovascular;;  . REPLACEMENT TOTAL KNEE BILATERAL     Family History  Problem Relation Age of Onset  . Aortic aneurysm Mother    Social  History   Tobacco Use  . Smoking status: Former Smoker    Packs/day: 1.00    Years: 42.00    Pack years: 42.00    Types: Cigarettes  . Smokeless tobacco: Never Used  . Tobacco comment: quit 42 years ago  Substance Use Topics  . Alcohol use: Yes    Comment: occasionally   Allergies  Allergen Reactions  . Ambien [Zolpidem Tartrate] Other (See Comments)    Nightmares/ yelling out    Prior to Admission medications   Medication Sig Start Date End Date Taking? Authorizing Provider  albuterol (PROVENTIL HFA;VENTOLIN HFA) 108 (90 Base) MCG/ACT inhaler Inhale 2 puffs into the lungs every 4 (four) hours as needed for wheezing or shortness of breath. 12/25/16  Yes Sudini, Alveta Heimlich, MD  allopurinol (ZYLOPRIM) 100 MG tablet Take 1 tablet (100 mg total) by mouth daily. 06/10/17  Yes Gladstone Lighter, MD  atorvastatin (LIPITOR) 80 MG tablet TAKE ONE TABLET AT BEDTIME 04/15/15  Yes [provider]  azelastine (ASTELIN) 0.1 % nasal spray Place 1-2 sprays into the nose 2 (two) times daily as needed for rhinitis.    Yes [provider]  Coenzyme Q10 (COQ-10) 200 MG CAPS Take 200 mg by mouth every evening.   Yes [provider]  doxycycline (DORYX) 100 MG EC tablet Take 100 mg by mouth 2 (two) times daily.   Yes [provider]  ELIQUIS 5 MG TABS  tablet Take 5 mg by mouth 2 (two) times daily.  12/16/15  Yes [provider]  gabapentin (NEURONTIN) 300 MG capsule Take 300 mg by mouth 2 (two) times daily. 12/14/16  Yes [provider]  Melatonin 10 MG TABS Take 20 mg by mouth daily as needed (sleep).    Yes [provider]  metoprolol tartrate (LOPRESSOR) 25 MG tablet Take 50 mg by mouth 2 (two) times daily.  12/16/15  Yes [provider]  spironolactone (ALDACTONE) 25 MG tablet Take 25 mg by mouth daily.  12/16/15  Yes [provider]  torsemide (DEMADEX) 20 MG tablet Take 2 tablets (40 mg total) by mouth 2 (two) times daily.  06/10/17  Yes Gladstone Lighter, MD  umeclidinium-vilanterol (ANORO ELLIPTA) 62.5-25 MCG/INH AEPB Inhale 1 puff into the lungs daily.  05/16/14  Yes [provider]  polyethylene glycol (MIRALAX / GLYCOLAX) packet Take 17 g by mouth daily. Patient not taking: Reported on 07/12/2017 06/11/17   Gladstone Lighter, MD  zolpidem (AMBIEN) 5 MG tablet Take 1 tablet (5 mg total) by mouth at bedtime as needed for sleep. Patient not taking: Reported on 07/12/2017 12/25/16   Hillary Bow, MD    Review of Systems  Constitutional: Positive for fatigue (minimal). Negative for appetite change.  Eyes: Negative.   Respiratory: Positive for shortness of breath (with moderate exertion). Negative for chest tightness.   Cardiovascular: Positive for leg swelling. Negative for chest pain.  Gastrointestinal: Positive for abdominal distention. Negative for abdominal pain.  Endocrine: Negative.   Genitourinary: Negative.   Musculoskeletal: Positive for arthralgias (left lower leg) and back pain.  Skin: Positive for color change (lower legs weeping).  Allergic/Immunologic: Negative.   Neurological: Negative for dizziness and light-headedness.  Hematological: Negative for adenopathy. Bruises/bleeds easily.  Psychiatric/Behavioral: Negative for dysphoric mood and sleep disturbance. The patient is not nervous/anxious.    Vitals:   07/12/17 1452  BP: (!) 149/84  Pulse: 76  Resp: 18  SpO2: 91%  Weight: 266 lb 4 oz (120.8 kg)  Height: 6' (1.829 m)   Wt Readings from Last 3 Encounters:  07/12/17 266 lb 4 oz (120.8 kg)  06/10/17 274 lb (124.3 kg)  03/29/17 266 lb (120.7 kg)    Lab Results  Component Value Date   CREATININE 1.14 06/10/2017   CREATININE 1.17 06/09/2017   CREATININE 1.21 06/08/2017   Physical Exam  Constitutional: He is oriented to person, place, and time. He appears well-developed and well-nourished.  HENT:  Head: Normocephalic and atraumatic.  Neck: Normal range of motion. Neck  supple. No JVD present.  Cardiovascular: Normal rate and regular rhythm.  Pulmonary/Chest: Effort normal. No respiratory distress. He has no wheezes. He has no rales.  Abdominal: He exhibits distension. There is no tenderness.  Musculoskeletal:       Right lower leg: He exhibits edema (1+).       Left lower leg: He exhibits edema (1+).  Neurological: He is alert and oriented to person, place, and time.  Skin: Skin is warm and dry.  Psychiatric: He has a normal mood and affect. His behavior is normal.  Nursing note and vitals reviewed.    Assessment & Plan:  1. Acute on chronic Heart Failure with preserved ejection fraction-  - NYHA class III - moderately fluid overloaded today - abdominal distension; holding onto fluid in abdomen - patient doing daily weights and keeping log - educated on importance of daily weights and calling us if he has a weight change >  2 lbs overnight or 5 lbs in a week  - educated patient on importance of limiting salt intake; patient has hyponatremia so recommended more lax range of 2000-2500mg  daily; dietary information given to him about this - educated on importance of fluid restriction - last saw cardiologist (Dr. Nehemiah Massed) on 03/30/17 - will give IV Lasix 80 mg tomorrow as well as get a BMP and BNP - BMP 06/10/17 showed Na 131, K 4.4, GFR 55, Scr 1.14 - BNP 06/04/17 was 328 - patient also requested referral for dietician. Will refer to [place]  2. Afib- - rate controlled on metoprolol tartrate 50mg  BID - anticoag with apixaban 5mg  BID  3. HTN- - BP elevated at 149/84 - last saw PCP (Dr. Caryl Comes) today 07/12/17 and has follow up in 2 weeks - will re-evaluate at next appointment  4. OSA - reports trouble sleeping; takes melatonin and PRN ambien; reports adverse effects to Ambien so recommended that patient ask PCP about Ramelteon or Trazodone - wears CPAP at home but sometimes does not like to wear - when not wearing CPAP wears oxygen 2L  - also  uses oxygen PRN throughout the day - followed by pulmonology (Dr. Leonidas Romberg); last seen 03/29/17  Return 07/14/17 for follow-up or sooner if problems arise.   Lendon Ka, PharmD Pharmacy Resident

## 2017-07-13 ENCOUNTER — Ambulatory Visit
Admission: RE | Admit: 2017-07-13 | Discharge: 2017-07-13 | Disposition: A | Discharge status: Home / Self Care | Payer: Medicare Other | Source: Ambulatory Visit | Attending: Family | Admitting: Family

## 2017-07-13 DIAGNOSIS — I5033 Acute on chronic diastolic (congestive) heart failure: Secondary | ICD-10-CM | POA: Diagnosis not present

## 2017-07-13 LAB — BASIC METABOLIC PANEL
ANION GAP: 11 (ref 5–15)
BUN: 43 mg/dL — ABNORMAL HIGH (ref 6–20)
CALCIUM: 9.1 mg/dL (ref 8.9–10.3)
CO2: 30 mmol/L (ref 22–32)
Chloride: 88 mmol/L — ABNORMAL LOW (ref 101–111)
Creatinine, Ser: 1.36 mg/dL — ABNORMAL HIGH (ref 0.61–1.24)
GFR calc Af Amer: 52 mL/min — ABNORMAL LOW (ref >60)
GFR, EST NON AFRICAN AMERICAN: 44 mL/min — AB (ref >60)
Glucose, Bld: 117 mg/dL — ABNORMAL HIGH (ref 65–99)
POTASSIUM: 4 mmol/L (ref 3.5–5.1)
Sodium: 129 mmol/L — ABNORMAL LOW (ref 135–145)

## 2017-07-13 LAB — BRAIN NATRIURETIC PEPTIDE: B Natriuretic Peptide: 312 pg/mL — ABNORMAL HIGH (ref 0.0–100.0)

## 2017-07-13 MED ORDER — FUROSEMIDE 10 MG/ML IJ SOLN
INTRAMUSCULAR | Status: DC
Start: 2017-07-13 — End: 2017-07-14
  Filled 2017-07-13: qty 8

## 2017-07-13 MED ORDER — POTASSIUM CHLORIDE CRYS ER 20 MEQ PO TBCR
40.0000 meq | EXTENDED_RELEASE_TABLET | Freq: Once | ORAL | Status: AC
  Administered 2017-07-13: 40 meq via ORAL

## 2017-07-13 MED ORDER — SODIUM CHLORIDE FLUSH 0.9 % IV SOLN
INTRAVENOUS | Status: AC
  Filled 2017-07-13: qty 10

## 2017-07-13 MED ORDER — POTASSIUM CHLORIDE CRYS ER 20 MEQ PO TBCR
EXTENDED_RELEASE_TABLET | ORAL | Status: AC
  Filled 2017-07-13: qty 2

## 2017-07-13 MED ORDER — FUROSEMIDE 10 MG/ML IJ SOLN
80.0000 mg | Freq: Once | INTRAMUSCULAR | Status: AC
  Administered 2017-07-13: 80 mg via INTRAVENOUS

## 2017-07-14 ENCOUNTER — Encounter: Payer: Self-pay | Admitting: Family

## 2017-07-14 ENCOUNTER — Ambulatory Visit: Payer: Medicare Other | Attending: Family | Admitting: Family

## 2017-07-14 VITALS — BP 142/77 | HR 61 | Resp 18 | Ht 72.0 in | Wt 266.5 lb

## 2017-07-14 DIAGNOSIS — Z96653 Presence of artificial knee joint, bilateral: Secondary | ICD-10-CM | POA: Diagnosis not present

## 2017-07-14 DIAGNOSIS — M5030 Other cervical disc degeneration, unspecified cervical region: Secondary | ICD-10-CM | POA: Insufficient documentation

## 2017-07-14 DIAGNOSIS — G4733 Obstructive sleep apnea (adult) (pediatric): Secondary | ICD-10-CM | POA: Diagnosis not present

## 2017-07-14 DIAGNOSIS — E785 Hyperlipidemia, unspecified: Secondary | ICD-10-CM | POA: Diagnosis not present

## 2017-07-14 DIAGNOSIS — I130 Hypertensive heart and chronic kidney disease with heart failure and stage 1 through stage 4 chronic kidney disease, or unspecified chronic kidney disease: Secondary | ICD-10-CM | POA: Diagnosis not present

## 2017-07-14 DIAGNOSIS — Z79899 Other long term (current) drug therapy: Secondary | ICD-10-CM | POA: Diagnosis not present

## 2017-07-14 DIAGNOSIS — Z9889 Other specified postprocedural states: Secondary | ICD-10-CM | POA: Insufficient documentation

## 2017-07-14 DIAGNOSIS — Z888 Allergy status to other drugs, medicaments and biological substances status: Secondary | ICD-10-CM | POA: Insufficient documentation

## 2017-07-14 DIAGNOSIS — I5033 Acute on chronic diastolic (congestive) heart failure: Secondary | ICD-10-CM

## 2017-07-14 DIAGNOSIS — Z87891 Personal history of nicotine dependence: Secondary | ICD-10-CM | POA: Insufficient documentation

## 2017-07-14 DIAGNOSIS — Z8249 Family history of ischemic heart disease and other diseases of the circulatory system: Secondary | ICD-10-CM | POA: Diagnosis not present

## 2017-07-14 DIAGNOSIS — N183 Chronic kidney disease, stage 3 (moderate): Secondary | ICD-10-CM | POA: Diagnosis not present

## 2017-07-14 DIAGNOSIS — Z09 Encounter for follow-up examination after completed treatment for conditions other than malignant neoplasm: Secondary | ICD-10-CM | POA: Diagnosis not present

## 2017-07-14 DIAGNOSIS — I2510 Atherosclerotic heart disease of native coronary artery without angina pectoris: Secondary | ICD-10-CM | POA: Diagnosis not present

## 2017-07-14 DIAGNOSIS — E669 Obesity, unspecified: Secondary | ICD-10-CM | POA: Insufficient documentation

## 2017-07-14 DIAGNOSIS — Z951 Presence of aortocoronary bypass graft: Secondary | ICD-10-CM | POA: Diagnosis not present

## 2017-07-14 DIAGNOSIS — I10 Essential (primary) hypertension: Secondary | ICD-10-CM

## 2017-07-14 DIAGNOSIS — I480 Paroxysmal atrial fibrillation: Secondary | ICD-10-CM

## 2017-07-14 LAB — BASIC METABOLIC PANEL
Anion gap: 11 (ref 5–15)
BUN: 47 mg/dL — ABNORMAL HIGH (ref 6–20)
CALCIUM: 9.1 mg/dL (ref 8.9–10.3)
CHLORIDE: 92 mmol/L — AB (ref 101–111)
CO2: 29 mmol/L (ref 22–32)
Creatinine, Ser: 1.36 mg/dL — ABNORMAL HIGH (ref 0.61–1.24)
GFR calc Af Amer: 52 mL/min — ABNORMAL LOW (ref >60)
GFR calc non Af Amer: 44 mL/min — ABNORMAL LOW (ref >60)
GLUCOSE: 89 mg/dL (ref 65–99)
POTASSIUM: 4.3 mmol/L (ref 3.5–5.1)
Sodium: 132 mmol/L — ABNORMAL LOW (ref 135–145)

## 2017-07-14 NOTE — Patient Instructions (Addendum)
Continue weighing daily and call for an overnight weight gain of > 2 pounds or a weekly weight gain of >5 pounds.  Will call you with nephrology appointment.

## 2017-07-14 NOTE — Progress Notes (Signed)
Patient ID: Norman Morales, male    DOB: Jan 24, 1927, 84 y.o.   MRN: [id number]  HPI  Norman Morales is a 84 yo male with a PMH of diastolic HF, Afib, CKD3, HTN, hyperlipidemia, obstructive sleep apnea, previous tobacco/ alcohol use and obesity.   ECHO 06/04/17 showed EF of 60-65%  Admitted 06/04/17 with c/o SOB and fatigue. During his stay he was given IV Lasix and lost ~ 11L. Cardiology was consulted during admit. Discharged after 6 days.   He presents today for a follow-up visit with a chief complaint of moderate SOB on moderate exertion. Upon exam, he continues to have abdominal distention since his visit on 07/12/17 and after receiving IV Lasix on 07/13/17. His wife states that he only put out about 2 quarts of fluid after the IV Lasix. Patient also endorses chronic venous changes in his legs and has associated dermatologic changes and cellulitis. His PCP Dr. Caryl Comes recently switched his antibiotics from dicloxacillin to doxycycline and will follow up with the patient in 2 weeks. He denies any chest pain or palpitations.   Past Medical History:  Diagnosis Date  . A-fib ([place])   . Adrenal adenoma   . CHF (congestive heart failure) ([place])   . Coronary artery disease   . DDD (degenerative disc disease), cervical   . Hyperlipidemia   . Hypertension   . Lung nodule   . Restrictive lung disease    Past Surgical History:  Procedure Laterality Date  . CARDIAC SURGERY    . CATARACT EXTRACTION, BILATERAL    . CORONARY ARTERY BYPASS GRAFT    . EXPLORATION POST OPERATIVE OPEN HEART    . LUMBAR FUSION    . PERIPHERAL VASCULAR CATHETERIZATION Left 12/30/2015   Procedure: Lower Extremity Angiography;  Surgeon: Algernon Huxley, MD;  Location: [facility];  Service: Cardiovascular;  Laterality: Left;  . PERIPHERAL VASCULAR CATHETERIZATION  12/30/2015   Procedure: Lower Extremity Intervention;  Surgeon: Algernon Huxley, MD;  Location: [facility];  Service: Cardiovascular;;  . REPLACEMENT  TOTAL KNEE BILATERAL     Family History  Problem Relation Age of Onset  . Aortic aneurysm Mother    Social History   Tobacco Use  . Smoking status: Former Smoker    Packs/day: 1.00    Years: 42.00    Pack years: 42.00    Types: Cigarettes  . Smokeless tobacco: Never Used  . Tobacco comment: quit 42 years ago  Substance Use Topics  . Alcohol use: Not Currently    Comment: occasionally   Allergies  Allergen Reactions  . Ambien [Zolpidem Tartrate] Other (See Comments)    Nightmares/ yelling out    Prior to Admission medications   Medication Sig Start Date End Date Taking? Authorizing Provider  albuterol (PROVENTIL HFA;VENTOLIN HFA) 108 (90 Base) MCG/ACT inhaler Inhale 2 puffs into the lungs every 4 (four) hours as needed for wheezing or shortness of breath. 12/25/16  Yes Sudini, Alveta Heimlich, MD  allopurinol (ZYLOPRIM) 100 MG tablet Take 1 tablet (100 mg total) by mouth daily. 06/10/17  Yes Gladstone Lighter, MD  atorvastatin (LIPITOR) 80 MG tablet TAKE ONE TABLET AT BEDTIME 04/15/15  Yes [provider]  azelastine (ASTELIN) 0.1 % nasal spray Place 1-2 sprays into the nose 2 (two) times daily as needed for rhinitis.    Yes [provider]  Coenzyme Q10 (COQ-10) 200 MG CAPS Take 200 mg by mouth every evening.   Yes [provider]  doxycycline (DORYX) 100 MG  EC tablet Take 100 mg by mouth 2 (two) times daily.   Yes [provider]  ELIQUIS 5 MG TABS tablet Take 5 mg by mouth 2 (two) times daily.  12/16/15  Yes [provider]  gabapentin (NEURONTIN) 300 MG capsule Take 300 mg by mouth 2 (two) times daily. 12/14/16  Yes [provider]  Melatonin 10 MG TABS Take 20 mg by mouth daily as needed (sleep).    Yes [provider]  metoprolol tartrate (LOPRESSOR) 25 MG tablet Take 50 mg by mouth 2 (two) times daily.  12/16/15  Yes [provider]  spironolactone (ALDACTONE) 25 MG tablet Take 25 mg by mouth daily.  12/16/15   Yes [provider]  torsemide (DEMADEX) 20 MG tablet Take 2 tablets (40 mg total) by mouth 2 (two) times daily. 06/10/17  Yes Gladstone Lighter, MD  umeclidinium-vilanterol (ANORO ELLIPTA) 62.5-25 MCG/INH AEPB Inhale 1 puff into the lungs daily.  05/16/14  Yes [provider]  polyethylene glycol (MIRALAX / GLYCOLAX) packet Take 17 g by mouth daily. Patient not taking: Reported on 07/12/2017 06/11/17   Gladstone Lighter, MD  zolpidem (AMBIEN) 5 MG tablet Take 1 tablet (5 mg total) by mouth at bedtime as needed for sleep. Patient not taking: Reported on 07/12/2017 12/25/16   Hillary Bow, MD    Review of Systems  Constitutional: Positive for fatigue (minimal). Negative for appetite change.  HENT: Negative for congestion, postnasal drip and sore throat.   Eyes: Negative.   Respiratory: Positive for shortness of breath (with moderate exertion). Negative for chest tightness.   Cardiovascular: Positive for leg swelling. Negative for chest pain.  Gastrointestinal: Positive for abdominal distention. Negative for abdominal pain.  Endocrine: Negative.   Genitourinary: Negative.   Musculoskeletal: Positive for arthralgias (left lower leg) and back pain.  Skin: Positive for color change (lower legs weeping). Negative for rash.  Allergic/Immunologic: Negative.   Neurological: Negative for dizziness and light-headedness.  Hematological: Negative for adenopathy. Bruises/bleeds easily.  Psychiatric/Behavioral: Negative for dysphoric mood and sleep disturbance. The patient is not nervous/anxious.    Vitals:   07/14/17 1142  BP: (!) 142/77  Pulse: 61  Resp: 18  SpO2: 92%  Weight: 266 lb 8 oz (120.9 kg)  Height: 6' (1.829 m)   Wt Readings from Last 3 Encounters:  07/14/17 266 lb 8 oz (120.9 kg)  07/13/17 266 lb (120.7 kg)  07/12/17 266 lb 4 oz (120.8 kg)    Lab Results  Component Value Date   CREATININE 1.36 (H) 07/14/2017   CREATININE 1.36 (H) 07/13/2017   CREATININE 1.14  06/10/2017   Physical Exam  Constitutional: He is oriented to person, place, and time. He appears well-developed and well-nourished.  HENT:  Head: Normocephalic and atraumatic.  Neck: Normal range of motion. Neck supple. No JVD present.  Cardiovascular: Normal rate and regular rhythm.  Pulmonary/Chest: Effort normal. No respiratory distress. He has no wheezes. He has no rales.  Abdominal: He exhibits distension. There is no tenderness.  Musculoskeletal:       Right lower leg: He exhibits edema (1+).       Left lower leg: He exhibits edema (1+).  Neurological: He is alert and oriented to person, place, and time.  Skin: Skin is warm and dry.  Psychiatric: He has a normal mood and affect. His behavior is normal.  Nursing note and vitals reviewed.    Assessment & Plan:  1. Acute on Chronic Heart Failure with preserved ejection fraction-  - NYHA  class III - moderately fluid overloaded today - abdominal distension - patient doing daily weights and keeping log - educated on importance of daily weights and calling us if he has a weight change > 2 lbs overnight or 5 lbs in a week  - educated patient on importance of limiting salt intake; patient has hyponatremia so recommended more lax range of 2000-2500mg  daily - educated on importance of fluid restriction - last saw cardiologist (Dr. Nehemiah Massed) on 03/30/17 - got IV Lasix 80mg  yesterday - BMP 07/14/17 showed Na 132, K 4.3, GFR 44, Scr 1.36 - BMP 07/13/17 showed Na 129, K 4.0, GFR 44, Scr 1.36 - BNP 06/04/17 was 312 - patient also requested referral for dietician. Will refer to [facility] - due to concern for hyponatremia, did not give any more IV Lasix or change diuretics.  - referring patient to nephrologist for management of hyponatremia in setting of heart failure and abdominal edema. - will have patient start wearing compression boots to help further manage leg edema  2. Afib- - rate controlled on metoprolol tartrate 50mg  BID -  anticoag with apixaban 5mg  BID  3. HTN- - BP slightly elevated today at 142/77 - last saw PCP (Dr. Caryl Comes) today 07/12/17 and has follow up in 2 weeks - will re-evaluate at next appointment  4. OSA - reports trouble sleeping; takes melatonin and PRN ambien; reports adverse effects to Ambien so recommended that patient ask PCP about Ramelteon or Trazodone - wears CPAP at home but sometimes does not like to wear - when not wearing CPAP wears oxygen 2L  - also uses oxygen PRN throughout the day - followed by pulmonology (Dr. Leonidas Romberg); last seen 03/29/17  Return 08/18/17 for follow-up or sooner if problems arise.   Lendon Ka, PharmD Pharmacy Resident

## 2017-08-16 ENCOUNTER — Other Ambulatory Visit: Payer: Self-pay | Admitting: Family

## 2017-08-16 ENCOUNTER — Ambulatory Visit: Payer: Medicare Other | Admitting: Family

## 2017-08-16 ENCOUNTER — Other Ambulatory Visit: Payer: Self-pay

## 2017-08-16 ENCOUNTER — Ambulatory Visit
Admission: RE | Admit: 2017-08-16 | Discharge: 2017-08-16 | Disposition: A | Discharge status: Home / Self Care | Payer: Medicare Other | Source: Ambulatory Visit | Attending: Family | Admitting: Family

## 2017-08-16 ENCOUNTER — Encounter: Payer: Self-pay | Admitting: Family

## 2017-08-16 VITALS — BP 135/55 | HR 70 | Resp 18 | Ht 72.0 in | Wt 273.5 lb

## 2017-08-16 DIAGNOSIS — Z888 Allergy status to other drugs, medicaments and biological substances status: Secondary | ICD-10-CM

## 2017-08-16 DIAGNOSIS — I4891 Unspecified atrial fibrillation: Secondary | ICD-10-CM | POA: Diagnosis present

## 2017-08-16 DIAGNOSIS — I2510 Atherosclerotic heart disease of native coronary artery without angina pectoris: Secondary | ICD-10-CM

## 2017-08-16 DIAGNOSIS — I130 Hypertensive heart and chronic kidney disease with heart failure and stage 1 through stage 4 chronic kidney disease, or unspecified chronic kidney disease: Secondary | ICD-10-CM

## 2017-08-16 DIAGNOSIS — I5033 Acute on chronic diastolic (congestive) heart failure: Secondary | ICD-10-CM

## 2017-08-16 DIAGNOSIS — Z7289 Other problems related to lifestyle: Secondary | ICD-10-CM | POA: Insufficient documentation

## 2017-08-16 DIAGNOSIS — Z981 Arthrodesis status: Secondary | ICD-10-CM

## 2017-08-16 DIAGNOSIS — J984 Other disorders of lung: Secondary | ICD-10-CM | POA: Diagnosis present

## 2017-08-16 DIAGNOSIS — E871 Hypo-osmolality and hyponatremia: Secondary | ICD-10-CM | POA: Insufficient documentation

## 2017-08-16 DIAGNOSIS — N183 Chronic kidney disease, stage 3 (moderate): Secondary | ICD-10-CM | POA: Insufficient documentation

## 2017-08-16 DIAGNOSIS — J986 Disorders of diaphragm: Secondary | ICD-10-CM | POA: Diagnosis present

## 2017-08-16 DIAGNOSIS — Z7901 Long term (current) use of anticoagulants: Secondary | ICD-10-CM

## 2017-08-16 DIAGNOSIS — Z79899 Other long term (current) drug therapy: Secondary | ICD-10-CM | POA: Insufficient documentation

## 2017-08-16 DIAGNOSIS — I10 Essential (primary) hypertension: Secondary | ICD-10-CM

## 2017-08-16 DIAGNOSIS — E782 Mixed hyperlipidemia: Secondary | ICD-10-CM | POA: Diagnosis present

## 2017-08-16 DIAGNOSIS — E669 Obesity, unspecified: Secondary | ICD-10-CM

## 2017-08-16 DIAGNOSIS — J9811 Atelectasis: Secondary | ICD-10-CM | POA: Diagnosis present

## 2017-08-16 DIAGNOSIS — I739 Peripheral vascular disease, unspecified: Secondary | ICD-10-CM | POA: Diagnosis present

## 2017-08-16 DIAGNOSIS — E875 Hyperkalemia: Secondary | ICD-10-CM | POA: Diagnosis not present

## 2017-08-16 DIAGNOSIS — Z951 Presence of aortocoronary bypass graft: Secondary | ICD-10-CM

## 2017-08-16 DIAGNOSIS — J441 Chronic obstructive pulmonary disease with (acute) exacerbation: Secondary | ICD-10-CM | POA: Diagnosis present

## 2017-08-16 DIAGNOSIS — Z87891 Personal history of nicotine dependence: Secondary | ICD-10-CM | POA: Insufficient documentation

## 2017-08-16 DIAGNOSIS — J9621 Acute and chronic respiratory failure with hypoxia: Secondary | ICD-10-CM | POA: Diagnosis present

## 2017-08-16 DIAGNOSIS — J440 Chronic obstructive pulmonary disease with acute lower respiratory infection: Secondary | ICD-10-CM | POA: Diagnosis present

## 2017-08-16 DIAGNOSIS — Z9981 Dependence on supplemental oxygen: Secondary | ICD-10-CM

## 2017-08-16 DIAGNOSIS — G4733 Obstructive sleep apnea (adult) (pediatric): Secondary | ICD-10-CM

## 2017-08-16 DIAGNOSIS — Z96653 Presence of artificial knee joint, bilateral: Secondary | ICD-10-CM | POA: Diagnosis present

## 2017-08-16 DIAGNOSIS — Z7951 Long term (current) use of inhaled steroids: Secondary | ICD-10-CM

## 2017-08-16 DIAGNOSIS — I480 Paroxysmal atrial fibrillation: Secondary | ICD-10-CM

## 2017-08-16 DIAGNOSIS — Z66 Do not resuscitate: Secondary | ICD-10-CM | POA: Diagnosis present

## 2017-08-16 DIAGNOSIS — R0609 Other forms of dyspnea: Secondary | ICD-10-CM | POA: Diagnosis not present

## 2017-08-16 DIAGNOSIS — J181 Lobar pneumonia, unspecified organism: Secondary | ICD-10-CM | POA: Diagnosis present

## 2017-08-16 DIAGNOSIS — K5900 Constipation, unspecified: Secondary | ICD-10-CM | POA: Diagnosis present

## 2017-08-16 DIAGNOSIS — D631 Anemia in chronic kidney disease: Secondary | ICD-10-CM | POA: Diagnosis present

## 2017-08-16 DIAGNOSIS — E785 Hyperlipidemia, unspecified: Secondary | ICD-10-CM

## 2017-08-16 DIAGNOSIS — E876 Hypokalemia: Secondary | ICD-10-CM | POA: Diagnosis present

## 2017-08-16 DIAGNOSIS — N179 Acute kidney failure, unspecified: Secondary | ICD-10-CM | POA: Diagnosis present

## 2017-08-16 LAB — BRAIN NATRIURETIC PEPTIDE: B Natriuretic Peptide: 321 pg/mL — ABNORMAL HIGH (ref 0.0–100.0)

## 2017-08-16 LAB — BASIC METABOLIC PANEL
Anion gap: 12 (ref 5–15)
BUN: 59 mg/dL — AB (ref 8–23)
CHLORIDE: 93 mmol/L — AB (ref 98–111)
CO2: 33 mmol/L — ABNORMAL HIGH (ref 22–32)
CREATININE: 1.79 mg/dL — AB (ref 0.61–1.24)
Calcium: 8.8 mg/dL — ABNORMAL LOW (ref 8.9–10.3)
GFR calc Af Amer: 37 mL/min — ABNORMAL LOW (ref >60)
GFR calc non Af Amer: 32 mL/min — ABNORMAL LOW (ref >60)
Glucose, Bld: 105 mg/dL — ABNORMAL HIGH (ref 70–99)
Potassium: 4.2 mmol/L (ref 3.5–5.1)
SODIUM: 138 mmol/L (ref 135–145)

## 2017-08-16 MED ORDER — SODIUM CHLORIDE FLUSH 0.9 % IV SOLN
INTRAVENOUS | Status: AC
  Filled 2017-08-16: qty 20

## 2017-08-16 MED ORDER — POTASSIUM CHLORIDE CRYS ER 20 MEQ PO TBCR
EXTENDED_RELEASE_TABLET | ORAL | Status: AC
  Filled 2017-08-16: qty 2

## 2017-08-16 MED ORDER — FUROSEMIDE 10 MG/ML IJ SOLN
INTRAMUSCULAR | Status: AC
  Filled 2017-08-16: qty 8

## 2017-08-16 MED ORDER — FUROSEMIDE 10 MG/ML IJ SOLN
80.0000 mg | Freq: Once | INTRAMUSCULAR | Status: AC
  Administered 2017-08-16: 80 mg via INTRAVENOUS

## 2017-08-16 MED ORDER — SODIUM CHLORIDE FLUSH 0.9 % IV SOLN
INTRAVENOUS | Status: AC
  Filled 2017-08-16: qty 10

## 2017-08-16 MED ORDER — POTASSIUM CHLORIDE CRYS ER 20 MEQ PO TBCR
40.0000 meq | EXTENDED_RELEASE_TABLET | Freq: Once | ORAL | Status: AC
  Administered 2017-08-16: 40 meq via ORAL

## 2017-08-16 NOTE — Patient Instructions (Signed)
Continue weighing daily and call for an overnight weight gain of > 2 pounds or a weekly weight gain of >5 pounds. 

## 2017-08-16 NOTE — Progress Notes (Signed)
Patient ID: Norman Morales, male    DOB: 05/07/1926, 84 y.o.   MRN: [id number]  HPI  Norman Morales is a 84 yo male with a PMH of diastolic HF, Afib, CKD3, HTN, hyperlipidemia, obstructive sleep apnea, previous tobacco/ alcohol use and obesity.   ECHO 06/04/17 showed EF of 60-65%  Admitted 06/04/17 with c/o SOB and fatigue. During his stay he was given IV Lasix and lost ~ 11L. Cardiology was consulted during admit. Discharged after 6 days.   He presents today for a follow-up visit with a chief complaint of moderate shortness of breath upon minimal exertion. He describes this as chronic in nature having been present for several years. He does feel like it's been worsening over the last 3-4 days. He has associated fatigue, pedal edema, abdominal distention, weakness and gradual weight gain. Just started wearing lymphapress compression boots this morning and has worn them once. Has not seen nephrology yet.   Past Medical History:  Diagnosis Date  . A-fib (Norman Morales)   . Adrenal adenoma   . CHF (congestive heart failure) (Norman Morales)   . Coronary artery disease   . DDD (degenerative disc disease), cervical   . Hyperlipidemia   . Hypertension   . Lung nodule   . Restrictive lung disease    Past Surgical History:  Procedure Laterality Date  . CARDIAC SURGERY    . CATARACT EXTRACTION, BILATERAL    . CORONARY ARTERY BYPASS GRAFT    . EXPLORATION POST OPERATIVE OPEN HEART    . LUMBAR FUSION    . PERIPHERAL VASCULAR CATHETERIZATION Left 12/30/2015   Procedure: Lower Extremity Angiography;  Surgeon: Norman Huxley, MD;  Location: [facility];  Service: Cardiovascular;  Laterality: Left;  . PERIPHERAL VASCULAR CATHETERIZATION  12/30/2015   Procedure: Lower Extremity Intervention;  Surgeon: Norman Huxley, MD;  Location: [facility];  Service: Cardiovascular;;  . REPLACEMENT TOTAL KNEE BILATERAL     Family History  Problem Relation Age of Onset  . Aortic aneurysm Mother    Social History    Tobacco Use  . Smoking status: Former Smoker    Packs/day: 1.00    Years: 42.00    Pack years: 42.00    Types: Cigarettes  . Smokeless tobacco: Never Used  . Tobacco comment: quit 42 years ago  Substance Use Topics  . Alcohol use: Not Currently    Comment: occasionally   Allergies  Allergen Reactions  . Ambien [Zolpidem Tartrate] Other (See Comments)    Nightmares/ yelling out    Prior to Admission medications   Medication Sig Start Date End Date Taking? Authorizing Provider  albuterol (PROVENTIL HFA;VENTOLIN HFA) 108 (90 Base) MCG/ACT inhaler Inhale 2 puffs into the lungs every 4 (four) hours as needed for wheezing or shortness of breath. 12/25/16  Yes Sudini, Alveta Heimlich, MD  allopurinol (ZYLOPRIM) 100 MG tablet Take 1 tablet (100 mg total) by mouth daily. 06/10/17  Yes Norman Lighter, MD  atorvastatin (LIPITOR) 80 MG tablet TAKE ONE TABLET AT BEDTIME 04/15/15  Yes [provider]  azelastine (ASTELIN) 0.1 % nasal spray Place 1-2 sprays into the nose 2 (two) times daily as needed for rhinitis.    Yes [provider]  Coenzyme Q10 (COQ-10) 200 MG CAPS Take 200 mg by mouth every evening.   Yes [provider]  doxycycline (DORYX) 100 MG EC tablet Take 100 mg by mouth 2 (two) times daily.   Yes [provider]  ELIQUIS 5 MG TABS tablet Take  5 mg by mouth 2 (two) times daily.  12/16/15  Yes [provider]  gabapentin (NEURONTIN) 300 MG capsule Take 300 mg by mouth 2 (two) times daily. 12/14/16  Yes [provider]  Melatonin 10 MG TABS Take 20 mg by mouth daily as needed (sleep).    Yes [provider]  metoprolol tartrate (LOPRESSOR) 25 MG tablet Take 50 mg by mouth 2 (two) times daily.  12/16/15  Yes [provider]  polyethylene glycol (MIRALAX / GLYCOLAX) packet Take 17 g by mouth daily. 06/11/17  Yes Norman Lighter, MD  spironolactone (ALDACTONE) 25 MG tablet Take 25 mg by mouth daily.  12/16/15  Yes  [provider]  torsemide (DEMADEX) 20 MG tablet Take 2 tablets (40 mg total) by mouth 2 (two) times daily. 06/10/17  Yes Norman Lighter, MD  umeclidinium-vilanterol (ANORO ELLIPTA) 62.5-25 MCG/INH AEPB Inhale 1 puff into the lungs daily.  05/16/14  Yes [provider]  zolpidem (AMBIEN) 5 MG tablet Take 1 tablet (5 mg total) by mouth at bedtime as needed for sleep. 12/25/16  Yes Norman Bow, MD    Review of Systems  Constitutional: Positive for appetite change and fatigue (easily).  HENT: Negative for congestion, postnasal drip and sore throat.   Eyes: Negative.   Respiratory: Positive for shortness of breath (with minimal exertion). Negative for chest tightness.   Cardiovascular: Positive for leg swelling. Negative for chest pain.  Gastrointestinal: Positive for abdominal distention. Negative for abdominal pain.  Endocrine: Negative.   Genitourinary: Negative.   Musculoskeletal: Positive for arthralgias (left lower leg) and back pain.  Skin: Negative for color change and rash.  Allergic/Immunologic: Negative.   Neurological: Positive for weakness. Negative for dizziness and light-headedness.  Hematological: Negative for adenopathy. Bruises/bleeds easily.  Psychiatric/Behavioral: Negative for dysphoric mood and sleep disturbance. The patient is not nervous/anxious.    Vitals:   08/16/17 1435  BP: (!) 135/55  Pulse: 70  Resp: 18  SpO2: (!) 88%  Weight: 273 lb 8 oz (124.1 kg)  Height: 6' (1.829 m)   Wt Readings from Last 3 Encounters:  08/16/17 273 lb 8 oz (124.1 kg)  07/14/17 266 lb 8 oz (120.9 kg)  07/12/17 266 lb 4 oz (120.8 kg)   Lab Results  Component Value Date   CREATININE 1.36 (H) 07/14/2017   CREATININE 1.36 (H) 07/13/2017   CREATININE 1.14 06/10/2017    Physical Exam  Constitutional: He is oriented to person, place, and time. He appears well-developed and well-nourished.  HENT:  Head: Normocephalic and atraumatic.  Neck: Normal range of  motion. Neck supple. No JVD present.  Cardiovascular: Normal rate and regular rhythm.  Pulmonary/Chest: Effort normal. No respiratory distress. He has no wheezes. He has rales in the right lower field and the left lower field.  Abdominal: He exhibits distension. There is no tenderness.  Musculoskeletal:       Right lower leg: He exhibits edema (2+).       Left lower leg: He exhibits edema (2+).  Neurological: He is alert and oriented to person, place, and time.  Skin: Skin is warm and dry.  Psychiatric: He has a normal mood and affect. His behavior is normal.  Nursing note and vitals reviewed.    Assessment & Plan:  1. Acute on Chronic Heart Failure with preserved ejection fraction-  - NYHA class III - moderately fluid overloaded today - patient doing daily weights and keeping log - educated on importance of daily weights and calling us if  he has a weight change > 2 lbs overnight or 5 lbs in a week  - weight up 7 pounds from last visit 1 month ago  - educated patient on importance of limiting salt intake; patient has hyponatremia so recommended more lax range  of 2000-2500mg  daily - educated on importance of fluid restriction (36 ounces daily) - last saw cardiologist (Dr. Nehemiah Massed) on 03/30/17 - BNP 06/04/17 was 49 - will re-refer patient to nephrologist for management of hyponatremia in setting of heart failure and abdominal edema. - he started wearing compression boots today; Sees Dr. Lucky Cowboy 08/20/17 - will send patient to same day surgery for 80mg  IV lasix along with 15meq potassium today - sees PCP in 2 days so he can follow-up with patient  2. Afib- - rate controlled on metoprolol tartrate 50mg  BID - anticoag with apixaban 5mg  BID  3. HTN- - BP looks good today - last saw PCP (Dr. Caryl Comes) 07/27/17 - BMP 07/14/17 reviewed and showed Na 132, K 4.3, GFR 44, Scr 1.36  4. OSA - reports trouble sleeping; takes melatonin and PRN ambien; reports dreams often - not wearing CPAP  consistently as he doesn't like it; explained how untreated sleep apnea can affect his fatigue, shortness of breath and edema due to stressing his heart - encouraged him to wear his CPAP anytime that he is going to sleep even during the day - wearing oxygen at 2L around the clock - followed by pulmonology (Dr. Leonidas Romberg); last seen 03/29/17  Medication list was reviewed.  Return here in 3 weeks or sooner for any questions/problems before then.

## 2017-08-18 ENCOUNTER — Emergency Department: Payer: Medicare Other

## 2017-08-18 ENCOUNTER — Other Ambulatory Visit: Payer: Self-pay

## 2017-08-18 ENCOUNTER — Encounter: Payer: Self-pay | Admitting: *Deleted

## 2017-08-18 ENCOUNTER — Ambulatory Visit: Payer: Medicare Other | Admitting: Family

## 2017-08-18 ENCOUNTER — Inpatient Hospital Stay
Admission: EM | Admit: 2017-08-18 | Discharge: 2017-08-22 | DRG: 291 | Disposition: A | Discharge status: Home Health | Payer: Medicare Other | Attending: Internal Medicine | Admitting: Internal Medicine

## 2017-08-18 DIAGNOSIS — I130 Hypertensive heart and chronic kidney disease with heart failure and stage 1 through stage 4 chronic kidney disease, or unspecified chronic kidney disease: Secondary | ICD-10-CM | POA: Diagnosis present

## 2017-08-18 DIAGNOSIS — J9811 Atelectasis: Secondary | ICD-10-CM | POA: Diagnosis present

## 2017-08-18 DIAGNOSIS — Z7951 Long term (current) use of inhaled steroids: Secondary | ICD-10-CM | POA: Diagnosis not present

## 2017-08-18 DIAGNOSIS — R0602 Shortness of breath: Secondary | ICD-10-CM | POA: Diagnosis not present

## 2017-08-18 DIAGNOSIS — R918 Other nonspecific abnormal finding of lung field: Secondary | ICD-10-CM | POA: Diagnosis not present

## 2017-08-18 DIAGNOSIS — J181 Lobar pneumonia, unspecified organism: Secondary | ICD-10-CM | POA: Diagnosis present

## 2017-08-18 DIAGNOSIS — Z96653 Presence of artificial knee joint, bilateral: Secondary | ICD-10-CM | POA: Diagnosis present

## 2017-08-18 DIAGNOSIS — N179 Acute kidney failure, unspecified: Secondary | ICD-10-CM | POA: Diagnosis present

## 2017-08-18 DIAGNOSIS — Z7901 Long term (current) use of anticoagulants: Secondary | ICD-10-CM | POA: Diagnosis not present

## 2017-08-18 DIAGNOSIS — R0609 Other forms of dyspnea: Secondary | ICD-10-CM | POA: Diagnosis present

## 2017-08-18 DIAGNOSIS — Z87891 Personal history of nicotine dependence: Secondary | ICD-10-CM | POA: Diagnosis not present

## 2017-08-18 DIAGNOSIS — Z9981 Dependence on supplemental oxygen: Secondary | ICD-10-CM | POA: Diagnosis not present

## 2017-08-18 DIAGNOSIS — J9621 Acute and chronic respiratory failure with hypoxia: Secondary | ICD-10-CM | POA: Diagnosis present

## 2017-08-18 DIAGNOSIS — Z66 Do not resuscitate: Secondary | ICD-10-CM | POA: Diagnosis present

## 2017-08-18 DIAGNOSIS — E782 Mixed hyperlipidemia: Secondary | ICD-10-CM | POA: Diagnosis present

## 2017-08-18 DIAGNOSIS — K5900 Constipation, unspecified: Secondary | ICD-10-CM | POA: Diagnosis present

## 2017-08-18 DIAGNOSIS — Z981 Arthrodesis status: Secondary | ICD-10-CM | POA: Diagnosis not present

## 2017-08-18 DIAGNOSIS — I2510 Atherosclerotic heart disease of native coronary artery without angina pectoris: Secondary | ICD-10-CM | POA: Diagnosis present

## 2017-08-18 DIAGNOSIS — E876 Hypokalemia: Secondary | ICD-10-CM | POA: Diagnosis present

## 2017-08-18 DIAGNOSIS — J189 Pneumonia, unspecified organism: Secondary | ICD-10-CM | POA: Diagnosis not present

## 2017-08-18 DIAGNOSIS — I509 Heart failure, unspecified: Secondary | ICD-10-CM | POA: Diagnosis not present

## 2017-08-18 DIAGNOSIS — I5033 Acute on chronic diastolic (congestive) heart failure: Secondary | ICD-10-CM | POA: Diagnosis present

## 2017-08-18 DIAGNOSIS — N183 Chronic kidney disease, stage 3 unspecified: Secondary | ICD-10-CM

## 2017-08-18 DIAGNOSIS — I4891 Unspecified atrial fibrillation: Secondary | ICD-10-CM | POA: Diagnosis present

## 2017-08-18 DIAGNOSIS — J441 Chronic obstructive pulmonary disease with (acute) exacerbation: Secondary | ICD-10-CM | POA: Diagnosis present

## 2017-08-18 DIAGNOSIS — R05 Cough: Secondary | ICD-10-CM

## 2017-08-18 DIAGNOSIS — J9611 Chronic respiratory failure with hypoxia: Secondary | ICD-10-CM | POA: Diagnosis not present

## 2017-08-18 DIAGNOSIS — R059 Cough, unspecified: Secondary | ICD-10-CM

## 2017-08-18 DIAGNOSIS — Z951 Presence of aortocoronary bypass graft: Secondary | ICD-10-CM | POA: Diagnosis not present

## 2017-08-18 DIAGNOSIS — G4733 Obstructive sleep apnea (adult) (pediatric): Secondary | ICD-10-CM | POA: Diagnosis not present

## 2017-08-18 DIAGNOSIS — R0902 Hypoxemia: Secondary | ICD-10-CM

## 2017-08-18 DIAGNOSIS — N189 Chronic kidney disease, unspecified: Secondary | ICD-10-CM | POA: Diagnosis not present

## 2017-08-18 DIAGNOSIS — J440 Chronic obstructive pulmonary disease with acute lower respiratory infection: Secondary | ICD-10-CM | POA: Diagnosis present

## 2017-08-18 DIAGNOSIS — Z79899 Other long term (current) drug therapy: Secondary | ICD-10-CM | POA: Diagnosis not present

## 2017-08-18 DIAGNOSIS — J984 Other disorders of lung: Secondary | ICD-10-CM | POA: Diagnosis present

## 2017-08-18 DIAGNOSIS — Z888 Allergy status to other drugs, medicaments and biological substances status: Secondary | ICD-10-CM | POA: Diagnosis not present

## 2017-08-18 DIAGNOSIS — C7972 Secondary malignant neoplasm of left adrenal gland: Secondary | ICD-10-CM | POA: Diagnosis not present

## 2017-08-18 LAB — CBC
HCT: 35.8 % — ABNORMAL LOW (ref 40.0–52.0)
HEMOGLOBIN: 12 g/dL — AB (ref 13.0–18.0)
MCH: 31.5 pg (ref 26.0–34.0)
MCHC: 33.5 g/dL (ref 32.0–36.0)
MCV: 94 fL (ref 80.0–100.0)
PLATELETS: 164 10*3/uL (ref 150–440)
RBC: 3.81 MIL/uL — AB (ref 4.40–5.90)
RDW: 19.3 % — ABNORMAL HIGH (ref 11.5–14.5)
WBC: 9.2 10*3/uL (ref 3.8–10.6)

## 2017-08-18 LAB — BASIC METABOLIC PANEL
ANION GAP: 10 (ref 5–15)
BUN: 55 mg/dL — ABNORMAL HIGH (ref 8–23)
CALCIUM: 8.9 mg/dL (ref 8.9–10.3)
CO2: 33 mmol/L — ABNORMAL HIGH (ref 22–32)
CREATININE: 1.58 mg/dL — AB (ref 0.61–1.24)
Chloride: 92 mmol/L — ABNORMAL LOW (ref 98–111)
GFR, EST AFRICAN AMERICAN: 43 mL/min — AB (ref >60)
GFR, EST NON AFRICAN AMERICAN: 37 mL/min — AB (ref >60)
GLUCOSE: 130 mg/dL — AB (ref 70–99)
Potassium: 3.8 mmol/L (ref 3.5–5.1)
Sodium: 135 mmol/L (ref 135–145)

## 2017-08-18 LAB — TROPONIN I

## 2017-08-18 LAB — BRAIN NATRIURETIC PEPTIDE: B NATRIURETIC PEPTIDE 5: 273 pg/mL — AB (ref 0.0–100.0)

## 2017-08-18 MED ORDER — FUROSEMIDE 10 MG/ML IJ SOLN
60.0000 mg | Freq: Three times a day (TID) | INTRAMUSCULAR | Status: DC
  Administered 2017-08-18 – 2017-08-20 (×5): 60 mg via INTRAVENOUS
  Filled 2017-08-18 (×5): qty 6

## 2017-08-18 MED ORDER — COQ-10 200 MG PO CAPS: 200.0000 mg | ORAL_CAPSULE | Freq: Every evening | ORAL | Status: DC

## 2017-08-18 MED ORDER — ATORVASTATIN CALCIUM 20 MG PO TABS
80.0000 mg | ORAL_TABLET | Freq: Every day | ORAL | Status: DC
  Administered 2017-08-18 – 2017-08-21 (×4): 80 mg via ORAL
  Filled 2017-08-18 (×4): qty 4

## 2017-08-18 MED ORDER — CEFTRIAXONE SODIUM 1 G IJ SOLR
1.0000 g | INTRAMUSCULAR | Status: DC
  Administered 2017-08-18 – 2017-08-19 (×2): 1 g via INTRAVENOUS
  Filled 2017-08-18: qty 10
  Filled 2017-08-18 (×2): qty 1

## 2017-08-18 MED ORDER — ORAL CARE MOUTH RINSE
15.0000 mL | Freq: Two times a day (BID) | OROMUCOSAL | Status: DC
  Administered 2017-08-19 – 2017-08-21 (×4): 15 mL via OROMUCOSAL

## 2017-08-18 MED ORDER — SPIRONOLACTONE 25 MG PO TABS
25.0000 mg | ORAL_TABLET | ORAL | Status: DC
  Administered 2017-08-19 – 2017-08-21 (×2): 25 mg via ORAL
  Filled 2017-08-18 (×3): qty 1

## 2017-08-18 MED ORDER — APIXABAN 2.5 MG PO TABS
2.5000 mg | ORAL_TABLET | Freq: Two times a day (BID) | ORAL | Status: DC
  Administered 2017-08-18 – 2017-08-22 (×8): 2.5 mg via ORAL
  Filled 2017-08-18 (×8): qty 1

## 2017-08-18 MED ORDER — UMECLIDINIUM-VILANTEROL 62.5-25 MCG/INH IN AEPB
1.0000 | INHALATION_SPRAY | Freq: Every day | RESPIRATORY_TRACT | Status: DC
  Administered 2017-08-21 – 2017-08-22 (×2): 1 via RESPIRATORY_TRACT
  Filled 2017-08-18 (×2): qty 14

## 2017-08-18 MED ORDER — APIXABAN 5 MG PO TABS: 5.0000 mg | ORAL_TABLET | Freq: Two times a day (BID) | ORAL | Status: DC

## 2017-08-18 MED ORDER — METHYLPREDNISOLONE SODIUM SUCC 125 MG IJ SOLR
60.0000 mg | INTRAMUSCULAR | Status: DC
  Administered 2017-08-18: 60 mg via INTRAVENOUS
  Filled 2017-08-18: qty 2

## 2017-08-18 MED ORDER — POLYETHYLENE GLYCOL 3350 17 G PO PACK
17.0000 g | PACK | Freq: Every day | ORAL | Status: DC | PRN
  Administered 2017-08-19 – 2017-08-21 (×3): 17 g via ORAL
  Filled 2017-08-18 (×3): qty 1

## 2017-08-18 MED ORDER — AZELASTINE HCL 0.1 % NA SOLN
1.0000 | Freq: Two times a day (BID) | NASAL | Status: DC | PRN
Start: 2017-08-18 — End: 2017-08-22
  Filled 2017-08-18: qty 30

## 2017-08-18 MED ORDER — IPRATROPIUM-ALBUTEROL 0.5-2.5 (3) MG/3ML IN SOLN
3.0000 mL | RESPIRATORY_TRACT | Status: DC
  Administered 2017-08-18: 3 mL via RESPIRATORY_TRACT
  Filled 2017-08-18: qty 3

## 2017-08-18 MED ORDER — METOPROLOL TARTRATE 50 MG PO TABS
50.0000 mg | ORAL_TABLET | Freq: Two times a day (BID) | ORAL | Status: DC
  Administered 2017-08-18 – 2017-08-22 (×8): 50 mg via ORAL
  Filled 2017-08-18 (×8): qty 1

## 2017-08-18 MED ORDER — MELATONIN 5 MG PO TABS
10.0000 mg | ORAL_TABLET | Freq: Every day | ORAL | Status: DC | PRN
  Administered 2017-08-18 – 2017-08-21 (×3): 10 mg via ORAL
  Filled 2017-08-18 (×4): qty 2

## 2017-08-18 MED ORDER — FUROSEMIDE 10 MG/ML IJ SOLN
60.0000 mg | Freq: Once | INTRAMUSCULAR | Status: AC
  Administered 2017-08-18: 60 mg via INTRAVENOUS
  Filled 2017-08-18: qty 8

## 2017-08-18 MED ORDER — SODIUM CHLORIDE 0.9 % IV SOLN
500.0000 mg | INTRAVENOUS | Status: DC
  Administered 2017-08-18 – 2017-08-19 (×2): 500 mg via INTRAVENOUS
  Filled 2017-08-18 (×3): qty 500

## 2017-08-18 MED ORDER — IPRATROPIUM-ALBUTEROL 0.5-2.5 (3) MG/3ML IN SOLN
3.0000 mL | Freq: Four times a day (QID) | RESPIRATORY_TRACT | Status: DC
  Administered 2017-08-19 – 2017-08-22 (×11): 3 mL via RESPIRATORY_TRACT
  Filled 2017-08-18 (×11): qty 3

## 2017-08-18 MED ORDER — ALLOPURINOL 100 MG PO TABS
200.0000 mg | ORAL_TABLET | Freq: Every day | ORAL | Status: DC
  Administered 2017-08-18 – 2017-08-22 (×5): 200 mg via ORAL
  Filled 2017-08-18 (×5): qty 2

## 2017-08-18 NOTE — ED Triage Notes (Signed)
  Pt sent to ED by Dr. Cleda Mccreedy due to increased WOB and SOB. Pt is a chronic CHF and Bronchitis pt with recurrent pneumonia. Pt just finished Levaquin prescription on Thursday. Pt is unable to ambulate without increased WOB and had an oxygen saturation of 82% on chronic 2L. Pt was given 80 mg of IV lasix when last in office and was able to urinate without difficulty but did not see improvement in WOB. Per Dr. Cleda Mccreedy, "there is very little airflow upon auscultation of the lungs" Dr. Cleda Mccreedy attempted to direct admit pt but was told pt needed to come through ED first.

## 2017-08-18 NOTE — Consult Note (Signed)
[facility] Consultation Note  Patient ID: Norman Morales, MRN: [id number], DOB/3. : 24-Jul-1926 84 y.o. Admit date: 08/18/2017   Date of Consult: 08/18/2017 Primary Physician: Adin Hector, MD Primary Cardiologist: Nehemiah Massed  Chief Complaint:  Chief Complaint  Patient presents with  . Shortness of Breath   Reason for Consult: Acute respiratory failure with congestive heart failure  HPI: 84 y.o. male with known cardiovascular disease status post coronary bypass graft in the past and history of chronic diastolic dysfunction heart failure chronic nonvalvular atrial fibrillation essential hypertension and mixed hyperlipidemia who is been on appropriate medication management.  He has had multiple exacerbations of diastolic dysfunction heart failure as well as significant lower extremity edema multifactorial in nature.  Recently he has had some significant restrictive lung disease and required oxygen constantly.  With this he has now developed cough and congestion with probable community-acquired pneumonia of which was treated with Levaquin.  This did not help a great deal and he has since had worsening inflammatory and restrictive pulmonary symptoms with further cough and congestion.  The patient was seen in the emergency room with a chest x-ray showing patchy infiltrate as well as some pulmonary edema and other restrictive lung disease.  The patient was more hypoxic but improved now with increased oxygen levels.  The patient has had worsening lower extremity edema multifactorial in nature including above as well as significant chronic kidney disease currently the patient has had an elevated BNP of 273 and a normal troponin but this is more consistent with lung disease rather than acute coronary syndrome or congestive heart failure.  Recently the patient also has had some concerns and issues with bradycardia which may need adjustments of medication management including metoprolol  Past  Medical History:  Diagnosis Date  . A-fib ([place])   . Adrenal adenoma   . CHF (congestive heart failure) ([place])   . Coronary artery disease   . DDD (degenerative disc disease), cervical   . Hyperlipidemia   . Hypertension   . Lung nodule   . Restrictive lung disease       Surgical History:  Past Surgical History:  Procedure Laterality Date  . CARDIAC SURGERY    . CATARACT EXTRACTION, BILATERAL    . CORONARY ARTERY BYPASS GRAFT    . EXPLORATION POST OPERATIVE OPEN HEART    . LUMBAR FUSION    . PERIPHERAL VASCULAR CATHETERIZATION Left 12/30/2015   Procedure: Lower Extremity Angiography;  Surgeon: Algernon Huxley, MD;  Location: [facility];  Service: Cardiovascular;  Laterality: Left;  . PERIPHERAL VASCULAR CATHETERIZATION  12/30/2015   Procedure: Lower Extremity Intervention;  Surgeon: Algernon Huxley, MD;  Location: [facility];  Service: Cardiovascular;;  . REPLACEMENT TOTAL KNEE BILATERAL       Home Meds: Prior to Admission medications   Medication Sig Start Date End Date Taking? Authorizing Provider  allopurinol (ZYLOPRIM) 100 MG tablet Take 1 tablet (100 mg total) by mouth daily. Patient taking differently: Take 200 mg by mouth daily.  06/10/17  Yes Gladstone Lighter, MD  ammonium lactate (AMLACTIN) 12 % cream Apply topically 2 (two) times daily as needed for dry skin.   Yes [provider]  atorvastatin (LIPITOR) 80 MG tablet TAKE ONE TABLET AT BEDTIME 04/15/15  Yes [provider]  azelastine (ASTELIN) 0.1 % nasal spray Place 1-2 sprays into the nose 2 (two) times daily as needed for rhinitis.    Yes [provider]  QBHALPF 7  MG TABS tablet Take 5 mg by mouth 2 (two) times daily.  12/16/15  Yes [provider]  gabapentin (NEURONTIN) 300 MG capsule Take 1 capsule (300MG ) by mouth every morning and 2 capsules (600MG ) by mouth every evening   Yes [provider]  Melatonin 10 MG TABS Take 20 mg by mouth daily as needed  (sleep).    Yes [provider]  metoprolol tartrate (LOPRESSOR) 50 MG tablet Take 50 mg by mouth 2 (two) times daily.    Yes [provider]  spironolactone (ALDACTONE) 25 MG tablet Take 25 mg by mouth every other day.    Yes [provider]  torsemide (DEMADEX) 20 MG tablet Take 2 tablets (40 mg total) by mouth 2 (two) times daily. 06/10/17  Yes Gladstone Lighter, MD  umeclidinium-vilanterol (ANORO ELLIPTA) 62.5-25 MCG/INH AEPB Inhale 1 puff into the lungs daily.  05/16/14  Yes [provider]  albuterol (PROVENTIL HFA;VENTOLIN HFA) 108 (90 Base) MCG/ACT inhaler Inhale 2 puffs into the lungs every 4 (four) hours as needed for wheezing or shortness of breath. Patient not taking: Reported on 08/18/2017 12/25/16   Hillary Bow, MD  Coenzyme Q10 (COQ-10) 200 MG CAPS Take 200 mg by mouth every evening.    [provider]  polyethylene glycol (MIRALAX / GLYCOLAX) packet Take 17 g by mouth daily. Patient not taking: Reported on 08/18/2017 06/11/17   Gladstone Lighter, MD  zolpidem (AMBIEN) 5 MG tablet Take 1 tablet (5 mg total) by mouth at bedtime as needed for sleep. Patient not taking: Reported on 08/18/2017 12/25/16   Hillary Bow, MD    Inpatient Medications:  . allopurinol  200 mg Oral Daily  . apixaban  2.5 mg Oral BID  . atorvastatin  80 mg Oral QHS  . furosemide  60 mg Intravenous Q8H  . ipratropium-albuterol  3 mL Nebulization Q4H  . methylPREDNISolone (SOLU-MEDROL) injection  60 mg Intravenous Q24H  . metoprolol tartrate  50 mg Oral BID  . [START ON 08/19/2017] spironolactone  25 mg Oral QODAY  . umeclidinium-vilanterol  1 puff Inhalation Daily   . azithromycin    . cefTRIAXone (ROCEPHIN)  IV      Allergies:  Allergies  Allergen Reactions  . Ambien [Zolpidem Tartrate] Other (See Comments)    Nightmares/ yelling out     Social History   Socioeconomic History  . Marital status: Married    Spouse name: Not on file  . Number of  children: Not on file  . Years of education: Not on file  . Highest education level: Not on file  Occupational History  . Not on file  Social Needs  . Financial resource strain: Not on file  . Food insecurity:    Worry: Not on file    Inability: Not on file  . Transportation needs:    Medical: Not on file    Non-medical: Not on file  Tobacco Use  . Smoking status: Former Smoker    Packs/day: 1.00    Years: 42.00    Pack years: 42.00    Types: Cigarettes  . Smokeless tobacco: Never Used  . Tobacco comment: quit 42 years ago  Substance and Sexual Activity  . Alcohol use: Not Currently    Comment: occasionally  . Drug use: No  . Sexual activity: Not on file  Lifestyle  . Physical activity:    Days per week: Not on file    Minutes per session: Not on file  . Stress: Not on file  Relationships  . Social connections:    Talks on phone: Not on file    Gets together: Not on file    Attends religious service: Not on file    Active member of club or organization: Not on file    Attends meetings of clubs or organizations: Not on file    Relationship status: Not on file  . Intimate partner violence:    Fear of current or ex partner: Not on file    Emotionally abused: Not on file    Physically abused: Not on file    Forced sexual activity: Not on file  Other Topics Concern  . Not on file  Social History Narrative  . Not on file     Family History  Problem Relation Age of Onset  . Aortic aneurysm Mother      Review of Systems Positive for shortness of breath cough congestion and edema Negative for: General:  chills, fever, night sweats or weight changes.  Cardiovascular: Positive for PND orthopnea negative for syncope dizziness  Dermatological skin lesions rashes Respiratory: Positive for cough congestion Urologic: Frequent urination urination at night and hematuria Abdominal: negative for nausea, vomiting, diarrhea, bright red blood per rectum, melena, or  hematemesis Neurologic: negative for visual changes, and/or hearing changes  All other systems reviewed and are otherwise negative except as noted above.  Labs: Recent Labs    08/18/17 1206  TROPONINI <0.03   Lab Results  Component Value Date   WBC 9.2 08/18/2017   HGB 12.0 (L) 08/18/2017   HCT 35.8 (L) 08/18/2017   MCV 94.0 08/18/2017   PLT 164 08/18/2017    Recent Labs  Lab 08/18/17 1206  NA 135  K 3.8  CL 92*  CO2 33*  BUN 55*  CREATININE 1.58*  CALCIUM 8.9  GLUCOSE 130*   No results found for: CHOL, HDL, LDLCALC, TRIG No results found for: DDIMER  Radiology/Studies:  Dg Chest 2 View  Result Date: 08/18/2017 CLINICAL DATA:  Shortness of breath. EXAM: CHEST - 2 VIEW COMPARISON:  Chest x-ray dated Jun 09, 2017. FINDINGS: Stable cardiomegaly status post CABG. Mild pulmonary vascular congestion. Unchanged scarring in the left lower lobe. Increasing patchy opacity in the right lower lobe. No focal consolidation, pleural effusion, or pneumothorax. No acute osseous abnormality. Multiple fractured sternal wires are unchanged. IMPRESSION: 1. Increased patchy opacity in the right lower lobe could reflect atelectasis or infiltrate. 2. Mild pulmonary vascular congestion without overt edema. Electronically Signed   By: Titus Dubin M.D.   On: 08/18/2017 13:02    EKG: Atrial fibrillation with inferior infarct age undetermined with nonspecific ST and T wave changes  Weights: Filed Weights   08/18/17 1152  Weight: 273 lb (123.8 kg)     Physical Exam: Blood pressure (!) 154/72, pulse (!) 59, temperature (!) 97.5 F (36.4 C), temperature source Oral, resp. rate (!) 26, height 6' (1.829 m), weight 273 lb (123.8 kg), SpO2 96 %. Body mass index is 37.03 kg/m. General: Well developed, well nourished, in no acute distress. Head eyes ears nose throat: Normocephalic, atraumatic, sclera non-icteric, no xanthomas, nares are without discharge. No apparent thyromegaly and/or  mass  Lungs: Normal respiratory effort.  Diffuse wheezes, basilar rales, some rhonchi.  Heart: Irregular with normal S1 S2. no murmur gallop, no rub, PMI is normal size and placement, carotid upstroke normal without bruit, jugular venous pressure is normal Abdomen:   non-tender,  distended with normoactive bowel sounds. No hepatomegaly. No rebound/guarding. No obvious abdominal masses.  Abdominal aorta is normal size without bruit Extremities: 2+ edema positive cyanosis, no clubbing positive ulcers  Peripheral : 2+ bilateral upper extremity pulses, 2+ bilateral femoral pulses, 2+ bilateral dorsal pedal pulse Neuro: Alert and oriented. No facial asymmetry. No focal deficit. Moves all extremities spontaneously. Musculoskeletal: Normal muscle tone without kyphosis Psych:  Responds to questions appropriately with a normal affect.    Assessment: 84 year old male with known chronic diastolic dysfunction congestive heart failure with acute on chronic diastolic heart failure secondary to infection hypoxia chronic kidney disease and anemia without evidence of acute coronary syndrome with now atrial fibrillation with bradycardia and significant continued lower extremity edema  Plan: 1.  Continue supportive care of lung restrictive disease and significant concerns of infection and pneumonia 2.  Supplemental oxygen for hypoxia 3.  High-dose intravenous diuresis with Lasix watching closely for worsening chronic kidney disease for mild pulmonary edema and significant lower extremity edema exacerbated by above 4.  No further cardiac diagnostics necessary due to known diastolic dysfunction heart failure minor valvular heart disease unchanged from previous echocardiogram within the last several months 5.  Decrease metoprolol and watch for significant bradycardia 6.  Continue anticoagulation for further risk reduction in stroke with atrial fibrillation  7.  Other diagnostic treatment options after  above  Signed, Corey Skains M.D. [facility] 08/18/2017, 6:15 PM

## 2017-08-18 NOTE — ED Provider Notes (Signed)
[facility] Emergency Department Provider Note   ____________________________________________   First MD Initiated Contact with Patient 08/18/17 1225     (approximate)  I have reviewed the triage vital signs and the nursing notes.   HISTORY  Chief Complaint Shortness of Breath    HPI Norman Morales is a 84 y.o. male with a history of A. fib, coronary disease congestive heart failure  Patient presents today, reports he saw his primary care doctor Dr. Caryl Comes and was sent for admission.  Review of notes, indicate Dr. Caryl Comes did send the patient to the ER for further evaluation of the plan to be admitted due to hypoxia with oxygen saturations less than 88% in the clinic today while on 2 L oxygen.  Patient reports is been feeling short of breath increasingly for at least a week's time, if not a little bit longer.  He first started noticing increased shortness of breath and leg swelling while he was in [facility].  He was given a phone prescription for Levaquin, but has not seen any improvement.  No fevers or chills.  Some cough and productive at times.  Reports he is lost about 7 pounds having been given IV Lasix a couple of days ago after seeing Dr. Caryl Comes.  He is continuing to experience shortness of breath however.  No nausea vomiting.  No chest pain.  He has leg swelling in both lower legs, but reports this is well known, is been treated with [place] boots, and has had persistent redness swelling drainage in both lower legs now for over 2 months time.  Treated with multiple antibiotics without change.  Past Medical History:  Diagnosis Date  . A-fib ([place])   . Adrenal adenoma   . CHF (congestive heart failure) ([place])   . Coronary artery disease   . DDD (degenerative disc disease), cervical   . Hyperlipidemia   . Hypertension   . Lung nodule   . Restrictive lung disease     Patient Active Problem List   Diagnosis Date Noted  . Obstructive sleep apnea  07/12/2017  . Hyperlipidemia 09/04/2016  . PAD (peripheral artery disease) ([place]) 09/04/2016  . Swelling of limb 03/27/2016  . Arrhythmia 12/24/2015  . Essential hypertension 12/24/2015  . Congestive heart failure ([place]) 12/24/2015  . Atherosclerosis of native arteries of the extremities with ulceration ([place]) 12/24/2015  . CAD (coronary artery disease) of artery bypass graft 12/24/2015    Past Surgical History:  Procedure Laterality Date  . CARDIAC SURGERY    . CATARACT EXTRACTION, BILATERAL    . CORONARY ARTERY BYPASS GRAFT    . EXPLORATION POST OPERATIVE OPEN HEART    . LUMBAR FUSION    . PERIPHERAL VASCULAR CATHETERIZATION Left 12/30/2015   Procedure: Lower Extremity Angiography;  Surgeon: Algernon Huxley, MD;  Location: [facility];  Service: Cardiovascular;  Laterality: Left;  . PERIPHERAL VASCULAR CATHETERIZATION  12/30/2015   Procedure: Lower Extremity Intervention;  Surgeon: Algernon Huxley, MD;  Location: [facility];  Service: Cardiovascular;;  . REPLACEMENT TOTAL KNEE BILATERAL      Prior to Admission medications   Medication Sig Start Date End Date Taking? Authorizing Provider  allopurinol (ZYLOPRIM) 100 MG tablet Take 1 tablet (100 mg total) by mouth daily. Patient taking differently: Take 200 mg by mouth daily.  06/10/17  Yes Gladstone Lighter, MD  ammonium lactate (AMLACTIN) 12 % cream Apply topically 2 (two) times daily as needed for dry skin.   Yes  [provider]  atorvastatin (LIPITOR) 80 MG tablet TAKE ONE TABLET AT BEDTIME 04/15/15  Yes [provider]  azelastine (ASTELIN) 0.1 % nasal spray Place 1-2 sprays into the nose 2 (two) times daily as needed for rhinitis.    Yes [provider]  ELIQUIS 5 MG TABS tablet Take 5 mg by mouth 2 (two) times daily.  12/16/15  Yes [provider]  gabapentin (NEURONTIN) 300 MG capsule Take 1 capsule (300MG ) by mouth every morning and 2 capsules (600MG ) by mouth every evening   Yes  [provider]  Melatonin 10 MG TABS Take 20 mg by mouth daily as needed (sleep).    Yes [provider]  metoprolol tartrate (LOPRESSOR) 50 MG tablet Take 50 mg by mouth 2 (two) times daily.    Yes [provider]  spironolactone (ALDACTONE) 25 MG tablet Take 25 mg by mouth every other day.    Yes [provider]  torsemide (DEMADEX) 20 MG tablet Take 2 tablets (40 mg total) by mouth 2 (two) times daily. 06/10/17  Yes Gladstone Lighter, MD  umeclidinium-vilanterol (ANORO ELLIPTA) 62.5-25 MCG/INH AEPB Inhale 1 puff into the lungs daily.  05/16/14  Yes [provider]  albuterol (PROVENTIL HFA;VENTOLIN HFA) 108 (90 Base) MCG/ACT inhaler Inhale 2 puffs into the lungs every 4 (four) hours as needed for wheezing or shortness of breath. Patient not taking: Reported on 08/18/2017 12/25/16   Hillary Bow, MD  Coenzyme Q10 (COQ-10) 200 MG CAPS Take 200 mg by mouth every evening.    [provider]  polyethylene glycol (MIRALAX / GLYCOLAX) packet Take 17 g by mouth daily. Patient not taking: Reported on 08/18/2017 06/11/17   Gladstone Lighter, MD  zolpidem (AMBIEN) 5 MG tablet Take 1 tablet (5 mg total) by mouth at bedtime as needed for sleep. Patient not taking: Reported on 08/18/2017 12/25/16   Hillary Bow, MD    Allergies Ambien [zolpidem tartrate]  Family History  Problem Relation Age of Onset  . Aortic aneurysm Mother     Social History Social History   Tobacco Use  . Smoking status: Former Smoker    Packs/day: 1.00    Years: 42.00    Pack years: 42.00    Types: Cigarettes  . Smokeless tobacco: Never Used  . Tobacco comment: quit 42 years ago  Substance Use Topics  . Alcohol use: Not Currently    Comment: occasionally  . Drug use: No    Review of Systems Constitutional: No fever/chills Eyes: No visual changes. ENT: No sore throat. Cardiovascular: Denies chest pain. Respiratory: See HPI gastrointestinal: No abdominal pain.   No nausea, no vomiting.  No diarrhea.  No constipation. Genitourinary: Negative for dysuria. Musculoskeletal: Negative for back pain. Skin: Negative for rash. Neurological: Negative for headaches, focal weakness or numbness. He is compliant with his anticoagulation.   ____________________________________________   PHYSICAL EXAM:  VITAL SIGNS: ED Triage Vitals  Enc Vitals Group     BP 08/18/17 1153 134/62     Pulse Rate 08/18/17 1153 86     Resp 08/18/17 1153 (!) 26     Temp 08/18/17 1153 (!) 97.5 F (36.4 C)     Temp Source 08/18/17 1153 Oral     SpO2 08/18/17 1153 93 %     Weight 08/18/17 1152 273 lb (123.8 kg)     Height 08/18/17 1152 6' (1.829 m)     Head Circumference --      Peak Flow --  Pain Score 08/18/17 1152 0     Pain Loc --      Pain Edu? --      Excl. in [place]? --     Constitutional: Alert and oriented. Well appearing but appears slightly dyspneic.  Slightly tachypneic, speaks in phrases.  Oxygen saturation ranging from about 88 to 96% on 2 L while I speak to him. Eyes: Conjunctivae are normal. Head: Atraumatic. Nose: No congestion/rhinnorhea. Mouth/Throat: Mucous membranes are moist. Neck: No stridor.   Cardiovascular: Normal rate, regular rhythm. Grossly normal heart sounds.  Good peripheral circulation. Respiratory: Normal respiratory effort.  No retractions. Lungs clear upper, but diminished in the bases bilaterally Gastrointestinal: Soft and nontender. No distention. Musculoskeletal: No lower extremity tenderness though he does plan a 3+ lower extremity pitting edema.  He also has venous stasis, some erythema of the skin which she reports is been chronic and constant for about 2 months.  The lower legs do feel warm as well. Neurologic:  Normal speech and language. No gross focal neurologic deficits are appreciated.  Skin:  Skin is warm, dry and intact. No rash noted. Psychiatric: Mood and affect are normal. Speech and behavior are  normal.  ____________________________________________   LABS (all labs ordered are listed, but only abnormal results are displayed)  Labs Reviewed  BASIC METABOLIC PANEL - Abnormal; Notable for the following components:      Result Value   Chloride 92 (*)    CO2 33 (*)    Glucose, Bld 130 (*)    BUN 55 (*)    Creatinine, Ser 1.58 (*)    GFR calc non Af Amer 37 (*)    GFR calc Af Amer 43 (*)    All other components within normal limits  CBC - Abnormal; Notable for the following components:   RBC 3.81 (*)    Hemoglobin 12.0 (*)    HCT 35.8 (*)    RDW 19.3 (*)    All other components within normal limits  BRAIN NATRIURETIC PEPTIDE - Abnormal; Notable for the following components:   B Natriuretic Peptide 273.0 (*)    All other components within normal limits  TROPONIN I   ____________________________________________  EKG  Reviewed and entered by me at noon Heart rate 80 QRS 99 QTc 410 Atrial fibrillation, occasional PVC.  Probable old anterior MI.  No evidence of acute ischemia denoted.  Probable old inferior infarct. ____________________________________________  RADIOLOGY    Chest x-ray results reviewed by me.  Possible opacity in the right lower lobe versus atelectasis.  Mild congestion without overt edema. ____________________________________________   PROCEDURES  Procedure(s) performed: None  Procedures  Critical Care performed: No  ____________________________________________   INITIAL IMPRESSION / ASSESSMENT AND PLAN / ED COURSE  Pertinent labs & imaging results that were available during my care of the patient were reviewed by me and considered in my medical decision making (see chart for details).  Patient presents for ongoing hypoxia.  Recently diuresed, but continued to have persistent symptoms.  Treated for possible pneumonia but no change about a week ago with Levaquin.  He is afebrile, denies fevers, but has had a slightly productive of frothy  cough.  Overall the etiology is somewhat unclear to me, may be congestive heart failure, potentially some community-acquired pneumonia, bronchitis, or other etiology.  No pleuritic pain.  No chest pain.  Clinical history does not seem to suggest an acute coronary syndrome, not obviously an infection either with a normal white count no fever and chest x-ray  not demonstrating a clear infiltrate.  Discussed with the patient, also discussed with Dr. Tressia Miners.  Will admit the patient for further work-up and monitoring, as well as further differentiation as to cause.  I have added on BNP.  Patient agreeable with plan for admission.  Doing well on 2 L while at rest satting in the mid 90s.      ____________________________________________   FINAL CLINICAL IMPRESSION(S) / ED DIAGNOSES  Final diagnoses:  Dyspnea on exertion  Hypoxia      NEW MEDICATIONS STARTED DURING THIS VISIT:  New Prescriptions   No medications on file     Note:  This document was prepared using Dragon voice recognition software and may include unintentional dictation errors.     Delman Kitten, MD 08/18/17 1326

## 2017-08-18 NOTE — ED Notes (Signed)
Report called to Lauree Chandler

## 2017-08-18 NOTE — H&P (Signed)
[name] [place] NAME: Norman Morales    MR#:  [id number]  DATE OF BIRTH:  08/20/1926  DATE OF ADMISSION:  08/18/2017  PRIMARY CARE PHYSICIAN: Adin Hector, MD   REQUESTING/REFERRING PHYSICIAN: Dr. Delman Kitten  CHIEF COMPLAINT:   Chief Complaint  Patient presents with  . Shortness of Breath    HISTORY OF PRESENT ILLNESS:  Norman Morales  is a 84 y.o. male with a known history of atrial fibrillation on Eliquis, chronic diastolic CHF on 2 L home oxygen, CKD stage III, hypertension, restrictive lung disease presents to hospital from PCPs office secondary to hypoxia and worsening respiratory distress. Patient was admitted here 2 months ago for CHF exacerbation.  Required very high doses of Lasix to get him euvolemic.  He went to the beach last week, called his PCPs office complaining of worsening shortness of breath.  He states he has been compliant with his diet.  He Levaquin was called in for possible pneumonitis symptoms.  Went to see his PCP today.  Hypoxic on his chronic 2 L oxygen and very dyspneic at rest.  So he was sent to the emergency room.  According to family, or the last 5 days patient has had trouble walking across the room and is hypoxic on his home oxygen.  No chest pain.  No fevers or chills.  No recent travel.  PAST MEDICAL HISTORY:   Past Medical History:  Diagnosis Date  . A-fib ([place])   . Adrenal adenoma   . CHF (congestive heart failure) ([place])   . Coronary artery disease   . DDD (degenerative disc disease), cervical   . Hyperlipidemia   . Hypertension   . Lung nodule   . Restrictive lung disease     PAST SURGICAL HISTORY:   Past Surgical History:  Procedure Laterality Date  . CARDIAC SURGERY    . CATARACT EXTRACTION, BILATERAL    . CORONARY ARTERY BYPASS GRAFT    . EXPLORATION POST OPERATIVE OPEN HEART    . LUMBAR FUSION    . PERIPHERAL VASCULAR CATHETERIZATION Left 12/30/2015   Procedure: Lower  Extremity Angiography;  Surgeon: Algernon Huxley, MD;  Location: [facility];  Service: Cardiovascular;  Laterality: Left;  . PERIPHERAL VASCULAR CATHETERIZATION  12/30/2015   Procedure: Lower Extremity Intervention;  Surgeon: Algernon Huxley, MD;  Location: [facility];  Service: Cardiovascular;;  . REPLACEMENT TOTAL KNEE BILATERAL      SOCIAL HISTORY:   Social History   Tobacco Use  . Smoking status: Former Smoker    Packs/day: 1.00    Years: 42.00    Pack years: 42.00    Types: Cigarettes  . Smokeless tobacco: Never Used  . Tobacco comment: quit 42 years ago  Substance Use Topics  . Alcohol use: Not Currently    Comment: occasionally    FAMILY HISTORY:   Family History  Problem Relation Age of Onset  . Aortic aneurysm Mother     DRUG ALLERGIES:   Allergies  Allergen Reactions  . Ambien [Zolpidem Tartrate] Other (See Comments)    Nightmares/ yelling out     REVIEW OF SYSTEMS:   Review of Systems  Constitutional: Positive for malaise/fatigue. Negative for chills, fever and weight loss.  HENT: Negative for ear discharge, ear pain, hearing loss, nosebleeds and tinnitus.   Eyes: Negative for blurred vision, double vision and photophobia.  Respiratory: Positive for shortness of breath. Negative for cough, hemoptysis and  wheezing.   Cardiovascular: Positive for leg swelling. Negative for chest pain, palpitations and orthopnea.  Gastrointestinal: Negative for abdominal pain, constipation, diarrhea, heartburn, melena, nausea and vomiting.  Genitourinary: Negative for dysuria, frequency and urgency.  Musculoskeletal: Negative for back pain, myalgias and neck pain.  Skin: Negative for rash.  Neurological: Negative for dizziness, tingling, sensory change, speech change, focal weakness and headaches.  Endo/Heme/Allergies: Does not bruise/bleed easily.  Psychiatric/Behavioral: Negative for depression.    MEDICATIONS AT HOME:   Prior to Admission medications     Medication Sig Start Date End Date Taking? Authorizing Provider  allopurinol (ZYLOPRIM) 100 MG tablet Take 1 tablet (100 mg total) by mouth daily. Patient taking differently: Take 200 mg by mouth daily.  06/10/17  Yes Gladstone Lighter, MD  ammonium lactate (AMLACTIN) 12 % cream Apply topically 2 (two) times daily as needed for dry skin.   Yes [provider]  atorvastatin (LIPITOR) 80 MG tablet TAKE ONE TABLET AT BEDTIME 04/15/15  Yes [provider]  azelastine (ASTELIN) 0.1 % nasal spray Place 1-2 sprays into the nose 2 (two) times daily as needed for rhinitis.    Yes [provider]  ELIQUIS 5 MG TABS tablet Take 5 mg by mouth 2 (two) times daily.  12/16/15  Yes [provider]  gabapentin (NEURONTIN) 300 MG capsule Take 1 capsule (300MG ) by mouth every morning and 2 capsules (600MG ) by mouth every evening   Yes [provider]  Melatonin 10 MG TABS Take 20 mg by mouth daily as needed (sleep).    Yes [provider]  metoprolol tartrate (LOPRESSOR) 50 MG tablet Take 50 mg by mouth 2 (two) times daily.    Yes [provider]  spironolactone (ALDACTONE) 25 MG tablet Take 25 mg by mouth every other day.    Yes [provider]  torsemide (DEMADEX) 20 MG tablet Take 2 tablets (40 mg total) by mouth 2 (two) times daily. 06/10/17  Yes Gladstone Lighter, MD  umeclidinium-vilanterol (ANORO ELLIPTA) 62.5-25 MCG/INH AEPB Inhale 1 puff into the lungs daily.  05/16/14  Yes [provider]  albuterol (PROVENTIL HFA;VENTOLIN HFA) 108 (90 Base) MCG/ACT inhaler Inhale 2 puffs into the lungs every 4 (four) hours as needed for wheezing or shortness of breath. Patient not taking: Reported on 08/18/2017 12/25/16   Hillary Bow, MD  Coenzyme Q10 (COQ-10) 200 MG CAPS Take 200 mg by mouth every evening.    [provider]  polyethylene glycol (MIRALAX / GLYCOLAX) packet Take 17 g by mouth daily. Patient not taking: Reported on  08/18/2017 06/11/17   Gladstone Lighter, MD  zolpidem (AMBIEN) 5 MG tablet Take 1 tablet (5 mg total) by mouth at bedtime as needed for sleep. Patient not taking: Reported on 08/18/2017 12/25/16   Hillary Bow, MD      VITAL SIGNS:  Blood pressure 131/78, pulse 74, temperature (!) 97.5 F (36.4 C), temperature source Oral, resp. rate (!) 26, height 6' (1.829 m), weight 123.8 kg (273 lb), SpO2 97 %.  PHYSICAL EXAMINATION:   Physical Exam  GENERAL:  84 y.o.-year-old obese patient sitting in the bed, appears to be is distress   EYES: Pupils equal, round, reactive to light and accommodation. No scleral icterus. Extraocular muscles intact.  HEENT: Head atraumatic, normocephalic. Oropharynx and nasopharynx clear.  NECK:  Supple, no jugular venous distention. No thyroid enlargement, no tenderness.  LUNGS: Normal breath sounds bilaterally, no wheezing ,rhonchi or crepitation. Bibasilar crackles heard - lower 2/3rds of the lungs.  Using  accessory muscles of respiration.  CARDIOVASCULAR: S1, S2 normal. No  rubs, or gallops. 3/6 systolic murmur present ABDOMEN: Soft, nontender, nondistended. Bowel sounds present. No organomegaly or mass.  EXTREMITIES: No  cyanosis, or clubbing. 3+ lower extremity edema with erythema noted. NEUROLOGIC: Cranial nerves II through XII are intact. Muscle strength 5/5 in all extremities. Sensation intact. Gait not checked. Global weakness noted. PSYCHIATRIC: The patient is alert and oriented x 3.  SKIN: No obvious rash, lesion, or ulcer.   LABORATORY PANEL:   CBC Recent Labs  Lab 08/18/17 1206  WBC 9.2  HGB 12.0*  HCT 35.8*  PLT 164   ------------------------------------------------------------------------------------------------------------------  Chemistries  Recent Labs  Lab 08/18/17 1206  NA 135  K 3.8  CL 92*  CO2 33*  GLUCOSE 130*  BUN 55*  CREATININE 1.58*  CALCIUM 8.9    ------------------------------------------------------------------------------------------------------------------  Cardiac Enzymes Recent Labs  Lab 08/18/17 1206  TROPONINI <0.03   ------------------------------------------------------------------------------------------------------------------  RADIOLOGY:  Dg Chest 2 View  Result Date: 08/18/2017 CLINICAL DATA:  Shortness of breath. EXAM: CHEST - 2 VIEW COMPARISON:  Chest x-ray dated Jun 09, 2017. FINDINGS: Stable cardiomegaly status post CABG. Mild pulmonary vascular congestion. Unchanged scarring in the left lower lobe. Increasing patchy opacity in the right lower lobe. No focal consolidation, pleural effusion, or pneumothorax. No acute osseous abnormality. Multiple fractured sternal wires are unchanged. IMPRESSION: 1. Increased patchy opacity in the right lower lobe could reflect atelectasis or infiltrate. 2. Mild pulmonary vascular congestion without overt edema. Electronically Signed   By: Titus Dubin M.D.   On: 08/18/2017 13:02    EKG:   Orders placed or performed during the hospital encounter of 08/18/17  . ED EKG within 10 minutes  . ED EKG within 10 minutes  . EKG 12-Lead  . EKG 12-Lead    IMPRESSION AND PLAN:   Tiffany Talarico  is a 84 y.o. male with a known history of atrial fibrillation on Eliquis, chronic diastolic CHF on 2 L home oxygen, CKD stage III, hypertension, restrictive lung disease presents to hospital from PCPs office secondary to hypoxia and worsening respiratory distress.  1. Acute hypoxic respiratory failure-secondary to acute on chronic diastolic heart failure. -Chronically on 2 L oxygen at home, hypoxic in spite of 4 L oxygen at PCPs office -Secondary to pulmonary edema and also right lower lobe pneumonia. 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 -Started on IV Lasix.  Daily weights, strict input and output monitoring.  Antibiotics. -Incentive  spirometry -Chest x-ray with pulmonary edema on admission-- much improved -cardiology consult  2.  Restrictive lung disease-continue duo nebs.  Also has bronchospasm- steroids added.  Oxygen support as needed - on chronic 2L o2 -Also chest x-ray with right lower lobe infiltrate.  Will start azithromycin and Rocephin.  3.  Acute renal failure on CKD stage 3-Baseline creatinine seems to be around 1.3 -Secondary to congestive heart failure likely.  Monitor closely while on diuresis.  Patient has had issues with hyponatremia in the past after IV diuresis. -Nephrology has been consulted   4. Afib- rate controlled, on metoprolol On eliquis - pharmacy to renally dose eliquis  5. HTN- on lasix, metoprolol, aldactone  PT consult once respiratory status is stable      All the records are reviewed and case discussed with ED provider. Management plans discussed with the patient, family and they are in agreement.  CODE STATUS: Full Code  TOTAL TIME TAKING CARE OF THIS PATIENT: 1  minutes.    Gladstone Lighter M.D on 08/18/2017 at 2:28 PM  Between 7am to 6pm - Pager - [phone]  After 6pm go to [email or web address] - password [email or web address] [facility]  Office  [phone]  CC: Primary care physician; Adin Hector, MD

## 2017-08-18 NOTE — Progress Notes (Signed)
   [facility] [facility] Day: 0 days Norman Morales is a 84 y.o. male presenting with Shortness of Breath .   Advance care planning discussed with patient and also his wife at bedside. All questions in regards to overall condition and expected prognosis answered.  Patient has expressed that he would want CPR and shock and intubation for a short term, but would not want to be prolonged on ventilator. Wife agreed with that. So, at this time- will leave him as full code.  CODE STATUS: Full Code Time spent: 18 minutes

## 2017-08-18 NOTE — Progress Notes (Signed)
PHARMACIST - PHYSICIAN ORDER COMMUNICATION  CONCERNING: P&T Medication Policy on Herbal Medications  DESCRIPTION:  This patient's order for:  Coq10  has been noted.  This product(s) is classified as an "herbal" or natural product. Due to a lack of definitive safety studies or FDA approval, nonstandard manufacturing practices, plus the potential risk of unknown drug-drug interactions while on inpatient medications, the Pharmacy and Therapeutics Committee does not permit the use of "herbal" or natural products of this type within [facility].   ACTION TAKEN: The pharmacy department is unable to verify this order at this time and your patient has been informed of this safety policy. Please reevaluate patient's clinical condition at discharge and address if the herbal or natural product(s) should be resumed at that time.  Ulice Dash, PharmD Clinical Pharmacist

## 2017-08-18 NOTE — Consult Note (Signed)
ANTICOAGULATION CONSULT NOTE - Initial Consult  Pharmacy Consult for apixaban Indication: atrial fibrillation  Allergies  Allergen Reactions  . Ambien [Zolpidem Tartrate] Other (See Comments)    Nightmares/ yelling out     Patient Measurements: Height: 6' (182.9 cm) Weight: 273 lb (123.8 kg) IBW/kg (Calculated) : 77.6 Heparin Dosing Weight:   Vital Signs: Temp: 97.5 F (36.4 C) (07/17 1153) Temp Source: Oral (07/17 1153) BP: 131/78 (07/17 1330) Pulse Rate: 74 (07/17 1330)  Labs: Recent Labs    08/16/17 1533 08/18/17 1206  HGB  --  12.0*  HCT  --  35.8*  PLT  --  164  CREATININE 1.79* 1.58*  TROPONINI  --  <0.03    Estimated Creatinine Clearance: 43.1 mL/min (A) (by C-G formula based on SCr of 1.58 mg/dL (H)).   Medical History: Past Medical History:  Diagnosis Date  . A-fib ([place])   . Adrenal adenoma   . CHF (congestive heart failure) ([place])   . Coronary artery disease   . DDD (degenerative disc disease), cervical   . Hyperlipidemia   . Hypertension   . Lung nodule   . Restrictive lung disease     Medications:  Scheduled:  . apixaban  2.5 mg Oral BID    Assessment: Patient is a 84 year old male on apixaban PTA. Pt was on 5mg  BID. Pharmacy consulted to review if needs to be renally adjusted.  Goal of Therapy:   Monitor platelets by anticoagulation protocol: Yes   Plan:  Pt crcl is > 1.5 and Pt is >6 years old therefore qualifies for renal adjustment. Will adjust to 2.5mg  BID  Ramond Dial, Pharm.D, BCPS Clinical Pharmacist 08/18/2017,3:13 PM

## 2017-08-19 ENCOUNTER — Inpatient Hospital Stay: Payer: Medicare Other

## 2017-08-19 LAB — BASIC METABOLIC PANEL
Anion gap: 8 (ref 5–15)
BUN: 48 mg/dL — AB (ref 8–23)
CHLORIDE: 94 mmol/L — AB (ref 98–111)
CO2: 35 mmol/L — AB (ref 22–32)
Calcium: 9 mg/dL (ref 8.9–10.3)
Creatinine, Ser: 1.37 mg/dL — ABNORMAL HIGH (ref 0.61–1.24)
GFR calc Af Amer: 51 mL/min — ABNORMAL LOW (ref >60)
GFR calc non Af Amer: 44 mL/min — ABNORMAL LOW (ref >60)
GLUCOSE: 146 mg/dL — AB (ref 70–99)
POTASSIUM: 4.4 mmol/L (ref 3.5–5.1)
Sodium: 137 mmol/L (ref 135–145)

## 2017-08-19 LAB — URINALYSIS, ROUTINE W REFLEX MICROSCOPIC
Bilirubin Urine: NEGATIVE
Glucose, UA: NEGATIVE mg/dL
Hgb urine dipstick: NEGATIVE
Ketones, ur: NEGATIVE mg/dL
LEUKOCYTES UA: NEGATIVE
NITRITE: NEGATIVE
PROTEIN: NEGATIVE mg/dL
Specific Gravity, Urine: 1.011 (ref 1.005–1.030)
pH: 6 (ref 5.0–8.0)

## 2017-08-19 LAB — CBC
HEMATOCRIT: 37 % — AB (ref 40.0–52.0)
Hemoglobin: 12.1 g/dL — ABNORMAL LOW (ref 13.0–18.0)
MCH: 31.3 pg (ref 26.0–34.0)
MCHC: 32.7 g/dL (ref 32.0–36.0)
MCV: 95.7 fL (ref 80.0–100.0)
Platelets: 170 10*3/uL (ref 150–440)
RBC: 3.86 MIL/uL — ABNORMAL LOW (ref 4.40–5.90)
RDW: 19.6 % — ABNORMAL HIGH (ref 11.5–14.5)
WBC: 10.1 10*3/uL (ref 3.8–10.6)

## 2017-08-19 LAB — PROTEIN / CREATININE RATIO, URINE
Creatinine, Urine: 84 mg/dL
Protein Creatinine Ratio: 0.12 mg/mg{Cre} (ref 0.00–0.15)
Total Protein, Urine: 10 mg/dL

## 2017-08-19 MED ORDER — SODIUM CHLORIDE 0.9% FLUSH
3.0000 mL | Freq: Two times a day (BID) | INTRAVENOUS | Status: DC
  Administered 2017-08-19 – 2017-08-22 (×7): 3 mL via INTRAVENOUS

## 2017-08-19 MED ORDER — ONDANSETRON HCL 4 MG/2ML IJ SOLN
4.0000 mg | Freq: Four times a day (QID) | INTRAMUSCULAR | Status: DC | PRN
  Administered 2017-08-19: 4 mg via INTRAVENOUS
  Filled 2017-08-19: qty 2

## 2017-08-19 MED ORDER — GABAPENTIN 300 MG PO CAPS
300.0000 mg | ORAL_CAPSULE | Freq: Every morning | ORAL | Status: DC
  Administered 2017-08-20 – 2017-08-22 (×3): 300 mg via ORAL
  Filled 2017-08-19 (×3): qty 1

## 2017-08-19 MED ORDER — PREDNISONE 50 MG PO TABS
50.0000 mg | ORAL_TABLET | Freq: Every day | ORAL | Status: DC
  Administered 2017-08-20 – 2017-08-21 (×2): 50 mg via ORAL
  Filled 2017-08-19 (×2): qty 1

## 2017-08-19 MED ORDER — GABAPENTIN 300 MG PO CAPS
600.0000 mg | ORAL_CAPSULE | Freq: Every day | ORAL | Status: DC
  Administered 2017-08-19 – 2017-08-21 (×3): 600 mg via ORAL
  Filled 2017-08-19 (×3): qty 2

## 2017-08-19 NOTE — Progress Notes (Signed)
[facility] Encounter Note  Patient: ARSHIA RONDON / Admit Date: 08/18/2017 / Date of Encounter: 08/19/2017, 8:36 AM   Subjective: Patient's cough and congestion significantly improved today.  Patient had mild pulmonary edema improved today with better improvements with oxygenation.  Lower extremity edema slightly improved.  No evidence of myocardial infarction at this time.  Atrial fibrillation heart rate control reasonable  Review of Systems: Positive for: Cough congestion Negative for: Vision change, hearing change, syncope, dizziness, nausea, vomiting,diarrhea, bloody stool, stomach pain, positive for cough, congestion, negative for diaphoresis, urinary frequency, urinary pain,skin lesions, skin rashes Others previously listed  Objective: Telemetry: Atrial fibrillation with controlled ventricular rate Physical Exam: Blood pressure 135/70, pulse 77, temperature 98.2 F (36.8 C), temperature source Oral, resp. rate 18, height 6' (1.829 m), weight 268 lb 11.2 oz (121.9 kg), SpO2 93 %. Body mass index is 36.44 kg/m. General: Well developed, well nourished, in no acute distress. Head: Normocephalic, atraumatic, sclera non-icteric, no xanthomas, nares are without discharge. Neck: No apparent masses Lungs: Normal respirations with few wheezes, diffuse rhonchi, no rales , basilar crackles   Heart: Irregular rate and rhythm, normal S1 S2, no murmur, no rub, no gallop, PMI is normal size and placement, carotid upstroke normal without bruit, jugular venous pressure normal Abdomen: Soft, non-tender,  distended with normoactive bowel sounds. No hepatosplenomegaly. Abdominal aorta is normal size without bruit Extremities: 1+ edema, no clubbing, no cyanosis, no ulcers,  Peripheral: 2+ radial, 1 + femoral, 0 + dorsal pedal pulses Neuro: Alert and oriented. Moves all extremities spontaneously. Psych:  Responds to questions appropriately with a normal affect.   Intake/Output  Summary (Last 24 hours) at 08/19/2017 0836 Last data filed at 08/19/2017 0532 Gross per 24 hour  Intake -  Output 1700 ml  Net -1700 ml    Inpatient Medications:  . allopurinol  200 mg Oral Daily  . apixaban  2.5 mg Oral BID  . atorvastatin  80 mg Oral QHS  . furosemide  60 mg Intravenous Q8H  . ipratropium-albuterol  3 mL Nebulization Q6H WA  . mouth rinse  15 mL Mouth Rinse BID  . methylPREDNISolone (SOLU-MEDROL) injection  60 mg Intravenous Q24H  . metoprolol tartrate  50 mg Oral BID  . spironolactone  25 mg Oral QODAY  . umeclidinium-vilanterol  1 puff Inhalation Daily   Infusions:  . azithromycin Stopped (08/18/17 2311)  . cefTRIAXone (ROCEPHIN)  IV Stopped (08/18/17 2311)    Labs: Recent Labs    08/16/17 1533 08/18/17 1206  NA 138 135  K 4.2 3.8  CL 93* 92*  CO2 33* 33*  GLUCOSE 105* 130*  BUN 59* 55*  CREATININE 1.79* 1.58*  CALCIUM 8.8* 8.9   No results for input(s): AST, ALT, ALKPHOS, BILITOT, PROT, ALBUMIN in the last 72 hours. Recent Labs    08/18/17 1206  WBC 9.2  HGB 12.0*  HCT 35.8*  MCV 94.0  PLT 164   Recent Labs    08/18/17 1206  TROPONINI <0.03   Invalid input(s): POCBNP No results for input(s): HGBA1C in the last 72 hours.   Weights: Filed Weights   08/18/17 1152 08/19/17 0340  Weight: 273 lb (123.8 kg) 268 lb 11.2 oz (121.9 kg)     Radiology/Studies:  Dg Chest 2 View  Result Date: 08/18/2017 CLINICAL DATA:  Shortness of breath. EXAM: CHEST - 2 VIEW COMPARISON:  Chest x-ray dated Jun 09, 2017. FINDINGS: Stable cardiomegaly status post CABG. Mild pulmonary vascular congestion. Unchanged scarring in the  left lower lobe. Increasing patchy opacity in the right lower lobe. No focal consolidation, pleural effusion, or pneumothorax. No acute osseous abnormality. Multiple fractured sternal wires are unchanged. IMPRESSION: 1. Increased patchy opacity in the right lower lobe could reflect atelectasis or infiltrate. 2. Mild pulmonary vascular  congestion without overt edema. Electronically Signed   By: Titus Dubin M.D.   On: 08/18/2017 13:02     Assessment and Recommendation  84 y.o. male with known chronic diastolic dysfunction congestive heart failure chronic nonvalvular atrial fibrillation anemia chronic kidney disease and known coronary disease status post coronary bypass graft with acute respiratory failure and infection with possible pneumonia exacerbating heart failure without evidence of myocardial infarction 1.  Continue supportive care for infection and pneumonia bronchitis 2.  Oxygen supplementation for hypoxia 3.  Continue diuresis with intravenous Lasix for lower extremity edema and improved mild pulmonary edema 4.  To new metoprolol at lower dose for heart rate control of atrial fibrillation 5.  Continue anticoagulation for further risk reduction in stroke with atrial fibrillation 6.  High intensity cholesterol therapy 7.  No further cardiac diagnostics necessary at this time due to no evidence of myocardial infarction or changes in cardiac status 8.  Further treatment options after above although call if any occur further questions  Signed, Serafina Royals M.D. FACC

## 2017-08-19 NOTE — Evaluation (Signed)
Physical Therapy Evaluation Patient Details Name: Norman Morales MRN: [id number] DOB: [birth year] Today's Date: 08/19/2017   History of Present Illness  84 y.o. male with a known history of atrial fibrillation on Eliquis, chronic diastolic CHF on 2 L home oxygen, CKD stage III, hypertension, restrictive lung disease presents to hospital from PCPs office secondary to hypoxia and worsening respiratory distress.  Clinical Impression  Patient A&Ox4 at start of session, with no complaints of pain. Patient reports independence with assistive devices prior to admission, lives in 1 level house with 2 STE. Patient able to transfer and ambulate with supervision, 4WW, O2 via [place] at 3L ~22ft. No complaints of dizziness, SOB, no LOB. SpO2 mid 80s for ambulation, patient asymptomatic. The patient demonstrates return to [facility] and has no further acute physical therapy needs.    Follow Up Recommendations No PT follow up    Equipment Recommendations  None recommended by PT    Recommendations for Other Services       Precautions / Restrictions Restrictions Weight Bearing Restrictions: No      Mobility  Bed Mobility Overal bed mobility: Modified Independent                Transfers Overall transfer level: Modified independent Equipment used: 4-wheeled walker                Ambulation/Gait Ambulation/Gait assistance: Supervision;Min guard Gait Distance (Feet): 200 Feet Assistive device: 4-wheeled walker Gait Pattern/deviations: WFL(Within Functional Limits)     General Gait Details: Patient had no difficulty with ambulation, no LOB, no complaints of SOB/lightheadness  Stairs            Wheelchair Mobility    Modified Rankin (Stroke Patients Only)       Balance                                             Pertinent Vitals/Pain Pain Assessment: No/denies pain    Home Living Family/patient expects to be discharged to:: Private residence Living  Arrangements: Spouse/significant other Available Help at Discharge: Family Type of Home: House Home Access: Stairs to enter Entrance Stairs-Rails: Left Entrance Stairs-Number of Steps: 2  Home Layout: One level Home Equipment: Walker - 4 wheels;Cane - single point      Prior Function Level of Independence: Independent with assistive device(s)         Comments: Patient ambulates with 4WW and O2 at home at 2L     Hand Dominance        Extremity/Trunk Assessment   Upper Extremity Assessment Upper Extremity Assessment: Overall WFL for tasks assessed    Lower Extremity Assessment Lower Extremity Assessment: RLE deficits/detail;LLE deficits/detail RLE Deficits / Details: 4+/5 LLE Deficits / Details: 4+/5       Communication   Communication: No difficulties  Cognition Arousal/Alertness: Awake/alert Behavior During Therapy: WFL for tasks assessed/performed                                          General Comments      Exercises     Assessment/Plan    PT Assessment Patent does not need any further PT services  PT Problem List         PT Treatment Interventions      PT Goals (  Current goals can be found in the Care Plan section)       Frequency     Barriers to discharge        Co-evaluation               AM-PAC PT "6 Clicks" Daily Activity  Outcome Measure Difficulty turning over in bed (including adjusting bedclothes, sheets and blankets)?: None Difficulty moving from lying on back to sitting on the side of the bed? : None Difficulty sitting down on and standing up from a chair with arms (e.g., wheelchair, bedside commode, etc,.)?: A Little Help needed moving to and from a bed to chair (including a wheelchair)?: None Help needed walking in hospital room?: None Help needed climbing 3-5 steps with a railing? : A Little 6 Click Score: 22    End of Session Equipment Utilized During Treatment: Gait belt;Oxygen Activity  Tolerance: Patient tolerated treatment well Patient left: with chair alarm set;in chair;with call bell/phone within reach Nurse Communication: Mobility status PT Visit Diagnosis: Other abnormalities of gait and mobility (R26.89);Difficulty in walking, not elsewhere classified (R26.2)    Time: 1009-1040 PT Time Calculation (min) (ACUTE ONLY): 31 min   Charges:   PT Evaluation $PT Eval Low Complexity: 1 Low PT Treatments $Gait Training: 8-22 mins   PT G CodesLieutenant Diego PT, DPT 11:27 AM,08/19/17 [phone]

## 2017-08-19 NOTE — Consult Note (Signed)
[place] Nurse wound consult note Reason for Consult: bilateral LEs, needs Unna's boots Patient with longstanding history of venous stasis with one small ulceration noted on the RLE medial malleolar area. LLE is weeping profusely, therefore may need to change Unna's boot on this leg tomorrow based on status of the wraps.  Maintained with Unna's boots in the past. Followed by vascular surgery as well.  Has recently obtained long term stockings for use, but has only worn once.  Wound type: venous stasis RLE medial Measurement: 1cm x 1cm x 0.1cm  Wound bed: 100% yellow fibrinous material Drainage: minimal, serosanguinous from the RLE ulceration; actively weeping clear serous fluid from the LLE during application of the compression wrap Periwound: intact, venous skin changes over bilateral LE with redness, however redness is somewhat normal for this patient. Some brawny skin changes noted on the LLE. +1 edema bilaterally with palpable distal pulses.  Toes on each LE area ruddy, and patient reports neuropathy bilaterally prior to the application of the compression wraps.   Pressure Injury POA: NA    WOC nurse team will follow weekly while inpatient for Unna's boot change if needed on the LLE tomorrow 08/20/17.  Weekly follow up for change after this time.   Follow up with vascular or PCP for Unna's boot changes weekly at DC.    [place], [place], [place]

## 2017-08-19 NOTE — Progress Notes (Signed)
[facility]  CONSULT NOTE    Date: 08/19/2017                  Patient Name:  Norman Morales  MRN: [id number]  DOB: 06-02-1926  Age / Sex: 84 y.o., male         PCP: Adin Hector, MD                 Service Requesting Consult: Dr. Brett Albino                 Reason for Consult: Acute renal failure            History of Present Illness: Norman Morales was admitted to [facility] on 08/18/2017 for shortness of breath. He went to his PCP yesterday for follow up. He was found to have hypoxia and send to the ED. Family states this has been going on for 5 days.  Daughter at bedside who assists with history taking.   Patient started on IV furosemide 60mg  q8 with good results.   Patient states he was taking torsemide daily and spironolactone every other day.    Medications: Outpatient medications: Medications Prior to Admission  Medication Sig Dispense Refill Last Dose  . allopurinol (ZYLOPRIM) 100 MG tablet Take 1 tablet (100 mg total) by mouth daily. (Patient taking differently: Take 200 mg by mouth daily. ) 30 tablet 2 08/17/2017 at 0800  . ammonium lactate (AMLACTIN) 12 % cream Apply topically 2 (two) times daily as needed for dry skin.   PRN at PRN  . atorvastatin (LIPITOR) 80 MG tablet TAKE ONE TABLET AT BEDTIME   08/17/2017 at 2000  . azelastine (ASTELIN) 0.1 % nasal spray Place 1-2 sprays into the nose 2 (two) times daily as needed for rhinitis.    PRN at PRN  . ELIQUIS 5 MG TABS tablet Take 5 mg by mouth 2 (two) times daily.    08/17/2017 at 1700  . gabapentin (NEURONTIN) 300 MG capsule Take 1 capsule (300MG ) by mouth every morning and 2 capsules (600MG ) by mouth every evening   08/17/2017 at 1900  . Melatonin 10 MG TABS Take 20 mg by mouth daily as needed (sleep).    PRN at PRN  . metoprolol tartrate (LOPRESSOR) 50 MG tablet Take 50 mg by mouth 2 (two) times daily.    08/17/2017 at 1700  . spironolactone (ALDACTONE) 25 MG tablet Take 25 mg by mouth every other  day.    Taking  . torsemide (DEMADEX) 20 MG tablet Take 2 tablets (40 mg total) by mouth 2 (two) times daily. 120 tablet 1 08/17/2017 at 1700  . umeclidinium-vilanterol (ANORO ELLIPTA) 62.5-25 MCG/INH AEPB Inhale 1 puff into the lungs daily.    08/17/2017 at 0800  . albuterol (PROVENTIL HFA;VENTOLIN HFA) 108 (90 Base) MCG/ACT inhaler Inhale 2 puffs into the lungs every 4 (four) hours as needed for wheezing or shortness of breath. (Patient not taking: Reported on 08/18/2017) 1 Inhaler 1 Not Taking at Unknown time  . Coenzyme Q10 (COQ-10) 200 MG CAPS Take 200 mg by mouth every evening.   Taking  . polyethylene glycol (MIRALAX / GLYCOLAX) packet Take 17 g by mouth daily. (Patient not taking: Reported on 08/18/2017) 14 each 0 Not Taking at Unknown time  . zolpidem (AMBIEN) 5 MG tablet Take 1 tablet (5 mg total) by mouth at bedtime as needed for sleep. (Patient not taking: Reported on 08/18/2017) 10 tablet 0 Not Taking at Unknown  time    Current medications: Current Facility-Administered Medications  Medication Dose Route Frequency Provider Last Rate Last Dose  . allopurinol (ZYLOPRIM) tablet 200 mg  200 mg Oral Daily Gladstone Lighter, MD   200 mg at 08/19/17 0952  . apixaban (ELIQUIS) tablet 2.5 mg  2.5 mg Oral BID Ramond Dial, RPH   2.5 mg at 08/19/17 2035  . atorvastatin (LIPITOR) tablet 80 mg  80 mg Oral QHS Gladstone Lighter, MD   80 mg at 08/18/17 2316  . azelastine (ASTELIN) 0.1 % nasal spray 1-2 spray  1-2 spray Each Nare BID PRN Gladstone Lighter, MD      . azithromycin (ZITHROMAX) 500 mg in sodium chloride 0.9 % 250 mL IVPB  500 mg Intravenous Q24H Gladstone Lighter, MD   Stopped at 08/18/17 2311  . cefTRIAXone (ROCEPHIN) 1 g in sodium chloride 0.9 % 100 mL IVPB  1 g Intravenous Q24H Gladstone Lighter, MD   Stopped at 08/18/17 2311  . furosemide (LASIX) injection 60 mg  60 mg Intravenous Q8H Gladstone Lighter, MD   60 mg at 08/19/17 5974  . ipratropium-albuterol (DUONEB) 0.5-2.5 (3)  MG/3ML nebulizer solution 3 mL  3 mL Nebulization Q6H WA Gladstone Lighter, MD   3 mL at 08/19/17 1343  . MEDLINE mouth rinse  15 mL Mouth Rinse BID Gladstone Lighter, MD   15 mL at 08/19/17 0954  . Melatonin TABS 10 mg  10 mg Oral Daily PRN Gladstone Lighter, MD   10 mg at 08/18/17 2315  . metoprolol tartrate (LOPRESSOR) tablet 50 mg  50 mg Oral BID Gladstone Lighter, MD   50 mg at 08/19/17 0953  . ondansetron (ZOFRAN) injection 4 mg  4 mg Intravenous Q6H PRN Arta Silence, MD   4 mg at 08/19/17 0649  . polyethylene glycol (MIRALAX / GLYCOLAX) packet 17 g  17 g Oral Daily PRN Gladstone Lighter, MD      . Derrill Memo ON 08/20/2017] predniSONE (DELTASONE) tablet 50 mg  50 mg Oral Q breakfast Mayo, Pete Pelt, MD      . spironolactone (ALDACTONE) tablet 25 mg  25 mg Oral Thom Chimes, MD   25 mg at 08/19/17 0952  . umeclidinium-vilanterol (ANORO ELLIPTA) 62.5-25 MCG/INH 1 puff  1 puff Inhalation Daily Gladstone Lighter, MD          Allergies: Allergies  Allergen Reactions  . Ambien [Zolpidem Tartrate] Other (See Comments)    Nightmares/ yelling out       Past Medical History: Past Medical History:  Diagnosis Date  . A-fib ([place])   . Adrenal adenoma   . CHF (congestive heart failure) ([place])   . Coronary artery disease   . DDD (degenerative disc disease), cervical   . Hyperlipidemia   . Hypertension   . Lung nodule   . Restrictive lung disease      Past Surgical History: Past Surgical History:  Procedure Laterality Date  . CARDIAC SURGERY    . CATARACT EXTRACTION, BILATERAL    . CORONARY ARTERY BYPASS GRAFT    . EXPLORATION POST OPERATIVE OPEN HEART    . LUMBAR FUSION    . PERIPHERAL VASCULAR CATHETERIZATION Left 12/30/2015   Procedure: Lower Extremity Angiography;  Surgeon: Algernon Huxley, MD;  Location: [place] [facility];  Service: Cardiovascular;  Laterality: Left;  . PERIPHERAL VASCULAR CATHETERIZATION  12/30/2015   Procedure: Lower Extremity  Intervention;  Surgeon: Algernon Huxley, MD;  Location: [place] [facility];  Service: Cardiovascular;;  . REPLACEMENT TOTAL KNEE  BILATERAL       Family History: Family History  Problem Relation Age of Onset  . Aortic aneurysm Mother      Social History: Social History   Socioeconomic History  . Marital status: Married    Spouse name: Not on file  . Number of children: Not on file  . Years of education: Not on file  . Highest education level: Not on file  Occupational History  . Not on file  Social Needs  . Financial resource strain: Not on file  . Food insecurity:    Worry: Not on file    Inability: Not on file  . Transportation needs:    Medical: Not on file    Non-medical: Not on file  Tobacco Use  . Smoking status: Former Smoker    Packs/day: 1.00    Years: 42.00    Pack years: 42.00    Types: Cigarettes  . Smokeless tobacco: Never Used  . Tobacco comment: quit 42 years ago  Substance and Sexual Activity  . Alcohol use: Not Currently    Comment: occasionally  . Drug use: No  . Sexual activity: Not on file  Lifestyle  . Physical activity:    Days per week: Not on file    Minutes per session: Not on file  . Stress: Not on file  Relationships  . Social connections:    Talks on phone: Not on file    Gets together: Not on file    Attends religious service: Not on file    Active member of club or organization: Not on file    Attends meetings of clubs or organizations: Not on file    Relationship status: Not on file  . Intimate partner violence:    Fear of current or ex partner: Not on file    Emotionally abused: Not on file    Physically abused: Not on file    Forced sexual activity: Not on file  Other Topics Concern  . Not on file  Social History Narrative  . Not on file     Review of Systems: Review of Systems  Constitutional: Negative.  Negative for chills, diaphoresis, fever, malaise/fatigue and weight loss.  HENT: Negative.  Negative for  congestion, ear discharge, ear pain, hearing loss, nosebleeds, sinus pain, sore throat and tinnitus.   Eyes: Negative.  Negative for blurred vision, double vision, photophobia, pain, discharge and redness.  Respiratory: Positive for cough, hemoptysis, sputum production, shortness of breath and wheezing. Negative for stridor.   Cardiovascular: Positive for orthopnea, leg swelling and PND. Negative for chest pain, palpitations and claudication.  Gastrointestinal: Negative.  Negative for abdominal pain, blood in stool, constipation, diarrhea, heartburn, melena, nausea and vomiting.  Genitourinary: Negative.  Negative for dysuria, flank pain, frequency, hematuria and urgency.  Musculoskeletal: Negative.  Negative for back pain, falls, joint pain, myalgias and neck pain.  Skin: Negative.  Negative for itching and rash.  Neurological: Negative.  Negative for dizziness, tingling, tremors, sensory change, speech change, focal weakness, seizures, loss of consciousness, weakness and headaches.  Endo/Heme/Allergies: Negative.  Negative for environmental allergies and polydipsia. Does not bruise/bleed easily.  Psychiatric/Behavioral: Negative.  Negative for depression, hallucinations, memory loss, substance abuse and suicidal ideas. The patient is not nervous/anxious and does not have insomnia.     Vital Signs: Blood pressure 135/70, pulse 77, temperature 98.2 F (36.8 C), temperature source Oral, resp. rate 18, height 6' (1.829 m), weight 121.9 kg (268 lb 11.2 oz), SpO2 93 %.  Weight  trends: Filed Weights   08/18/17 1152 08/19/17 0340  Weight: 123.8 kg (273 lb) 121.9 kg (268 lb 11.2 oz)    Physical Exam: General: NAD, sitting in chair  Head: Normocephalic, atraumatic. Moist oral mucosal membranes  Eyes: Anicteric, PERRL  Neck: Supple, trachea midline  Lungs:  Bilateral diffuse dry crackles  Heart: irregular  Abdomen:  Soft, nontender, obese  Extremities:  1+  peripheral edema. Left lower  extremity with weeping  Neurologic: Nonfocal, moving all four extremities  Skin: No lesions         Lab results: Basic Metabolic Panel: Recent Labs  Lab 08/16/17 1533 08/18/17 1206 08/19/17 1339  NA 138 135 137  K 4.2 3.8 4.4  CL 93* 92* 94*  CO2 33* 33* 35*  GLUCOSE 105* 130* 146*  BUN 59* 55* 48*  CREATININE 1.79* 1.58* 1.37*  CALCIUM 8.8* 8.9 9.0    Liver Function Tests: No results for input(s): AST, ALT, ALKPHOS, BILITOT, PROT, ALBUMIN in the last 168 hours. No results for input(s): LIPASE, AMYLASE in the last 168 hours. No results for input(s): AMMONIA in the last 168 hours.  CBC: Recent Labs  Lab 08/18/17 1206 08/19/17 1339  WBC 9.2 10.1  HGB 12.0* 12.1*  HCT 35.8* 37.0*  MCV 94.0 95.7  PLT 164 170    Cardiac Enzymes: Recent Labs  Lab 08/18/17 1206  TROPONINI <0.03    BNP: Invalid input(s): POCBNP  CBG: No results for input(s): GLUCAP in the last 168 hours.  Microbiology: Results for orders placed or performed during the hospital encounter of 12/23/16  Blood Culture (routine x 2)     Status: None   Collection Time: 12/23/16  4:53 AM  Result Value Ref Range Status   Specimen Description BLOOD  Final   Special Requests NONE  Final   Culture NO GROWTH 5 DAYS  Final   Report Status 12/28/2016 FINAL  Final  Blood Culture (routine x 2)     Status: None   Collection Time: 12/23/16  4:53 AM  Result Value Ref Range Status   Specimen Description BLOOD  Final   Special Requests NONE  Final   Culture NO GROWTH 5 DAYS  Final   Report Status 12/28/2016 FINAL  Final    Coagulation Studies: No results for input(s): LABPROT, INR in the last 72 hours.  Urinalysis: No results for input(s): COLORURINE, LABSPEC, PHURINE, GLUCOSEU, HGBUR, BILIRUBINUR, KETONESUR, PROTEINUR, UROBILINOGEN, NITRITE, LEUKOCYTESUR in the last 72 hours.  Invalid input(s): APPERANCEUR    Imaging: Dg Chest 2 View  Result Date: 08/18/2017 CLINICAL DATA:  Shortness of breath.  EXAM: CHEST - 2 VIEW COMPARISON:  Chest x-ray dated Jun 09, 2017. FINDINGS: Stable cardiomegaly status post CABG. Mild pulmonary vascular congestion. Unchanged scarring in the left lower lobe. Increasing patchy opacity in the right lower lobe. No focal consolidation, pleural effusion, or pneumothorax. No acute osseous abnormality. Multiple fractured sternal wires are unchanged. IMPRESSION: 1. Increased patchy opacity in the right lower lobe could reflect atelectasis or infiltrate. 2. Mild pulmonary vascular congestion without overt edema. Electronically Signed   By: Titus Dubin M.D.   On: 08/18/2017 13:02      Assessment & Plan: Norman Morales is a 84 y.o. white male with restrictive lung disease on O2, congestive heart failure, atrial fibrillation, coronary artery disease, hypertension, adrenal mass, hyperlipidemia, peripheral vascular disease, who was admitted to [facility] on 08/18/2017 for Dyspnea on exertion [R06.09] Hypoxia [R09.02]  1. Acute renal failure on chronic kidney disease stage  III: no urine studies available. Baseline creatinine of 1.14, GFR of 55 on 06/10/17.  No contrast exposure Ultrasound on 5/9 shows no obstruction Acute renal failure secondary to acute cardiorenal syndrome  2. Hypertension with acute exacerbation of diastolic dysfunction and atrial fibrillation Home regimen of torsemide 20mg  bid, spirinolactone 25mg  every other day, and metoprolol  Plan - Continue IV furosemide - Continue spironolactone - Fluid restriction - Low salt diet - Check urine studies - Repeat renal ultrasound    LOS: 1 Bralyn Folkert 7/18/20192:24 PM

## 2017-08-19 NOTE — Progress Notes (Signed)
[place] at [place] NAME: Norman Morales    MR#:  [id number]  DATE OF BIRTH:  [birth year]  SUBJECTIVE:  Doing well today. States his breathing has improved and he is overall feeling better. Still having lower extremity edema, but had unna boots placed today. Denies any fevers or cough.  REVIEW OF SYSTEMS:  Review of Systems  Constitutional: Negative for chills and fever.  HENT: Positive for sore throat.   Respiratory: Negative for cough, shortness of breath and wheezing.   Cardiovascular: Positive for leg swelling. Negative for chest pain, palpitations and orthopnea.  Gastrointestinal: Negative for nausea and vomiting.  Musculoskeletal: Positive for back pain.  Neurological: Negative for dizziness and headaches.    DRUG ALLERGIES:   Allergies  Allergen Reactions  . Ambien [Zolpidem Tartrate] Other (See Comments)    Nightmares/ yelling out    VITALS:  Blood pressure 135/70, pulse 77, temperature 98.2 F (36.8 C), temperature source Oral, resp. rate 18, height 6' (1.829 m), weight 121.9 kg (268 lb 11.2 oz), SpO2 93 %. PHYSICAL EXAMINATION:  Physical Exam  General: well-appearing, sitting up in chair, in NAD HEENT: [place]/AT, EOMI, PERRLA, no scleral icterus Neck: No JVD, supple, normal ROM CV: RRR, no murmurs, rubs, or gallops Resp: Normal work of breathing, +bibasilar crackles louder on the right than left, mild expiratory wheezing throughout the right lung GI: +BS, soft, non-tender, non-distended Msk: unna boots in place, 2+ pitting edema bilaterally Neuro: CN 2-12 grossly intact, normal strength, normal sensation, no focal deficits Skin: No rashes or lesions on exposed skin Psych: Normal behavior, appropriate affect  LABORATORY PANEL:  Male CBC Recent Labs  Lab 08/18/17 1206  WBC 9.2  HGB 12.0*  HCT 35.8*  PLT 164    ------------------------------------------------------------------------------------------------------------------ Chemistries  Recent Labs  Lab 08/18/17 1206  NA 135  K 3.8  CL 92*  CO2 33*  GLUCOSE 130*  BUN 55*  CREATININE 1.58*  CALCIUM 8.9   RADIOLOGY:  No results found. ASSESSMENT AND PLAN:   Norman Morales  is a 84 y.o. male with a known history of atrial fibrillation on Eliquis, chronic diastolic CHF on 2 L home oxygen, CKD stage III, hypertension, restrictive lung disease presents to hospital from PCPs office secondary to hypoxia and worsening respiratory distress.  1. Acute hypoxic respiratory failure- secondary to acute on chronic diastolic heart failure and RLL pneumonia. Currently on 3L O2 (home 2L). - continue CTX and Azithromycin. Plan to switch to PO abx pending clinical improvement. - continue IV Lasix 60mg  tid - monitor I's and O's, daily weights - continue Solumedrol 60mg  daily. Plan to switch to Prednisone tomorrow. - incentive spirometry - duonebs prn  2.  Restrictive lung disease - duonebs prn - continue O2 supplementation (on 2L O2 at chronically at home) - continue solumedrol 60mg  IV for today. Plan to switch to Prednisone tomorrow.  3.  Acute renal failure on CKD stage 3- baseline Cr 1.3 (cr 1.58 here on admission) -Secondary to congestive heart failure likely.  Monitor closely while on diuresis.  Patient has had issues with hyponatremia in the past after IV diuresis. - bmp pending for today - nephrology has been consulted  4. Afib- rate controlled - continue eliquis 2.5mg  bid and lopressor bid  5. HTN- on lasix, metoprolol, aldactone   All the records are reviewed and case discussed with Care Management/Social Worker. Management plans discussed with the patient, family and they are in agreement.  CODE STATUS: Full Code  TOTAL TIME TAKING CARE OF THIS PATIENT: 30 minutes.   More than 50% of the time was spent in  counseling/coordination of care: YES  POSSIBLE D/C IN 1-2 DAYS, DEPENDING ON CLINICAL CONDITION.   Berna Spare Darin Arndt M.D on 08/19/2017 at 1:07 PM  Between 7am to 6pm - Pager - [phone]  After 6pm go to [email or web address] - password [email or web address] [facility]  [facility]  Office  [phone]  CC: Primary care physician; Adin Hector, MD  Note: This dictation was prepared with Dragon dictation along with smaller phrase technology. Any transcriptional errors that result from this process are unintentional.

## 2017-08-20 ENCOUNTER — Encounter (INDEPENDENT_AMBULATORY_CARE_PROVIDER_SITE_OTHER): Payer: Medicare Other | Admitting: Vascular Surgery

## 2017-08-20 LAB — CBC
HEMATOCRIT: 35.7 % — AB (ref 40.0–52.0)
HEMOGLOBIN: 11.7 g/dL — AB (ref 13.0–18.0)
MCH: 31.2 pg (ref 26.0–34.0)
MCHC: 32.7 g/dL (ref 32.0–36.0)
MCV: 95.3 fL (ref 80.0–100.0)
Platelets: 172 10*3/uL (ref 150–440)
RBC: 3.74 MIL/uL — AB (ref 4.40–5.90)
RDW: 19.4 % — ABNORMAL HIGH (ref 11.5–14.5)
WBC: 14.2 10*3/uL — ABNORMAL HIGH (ref 3.8–10.6)

## 2017-08-20 LAB — RENAL FUNCTION PANEL
ANION GAP: 6 (ref 5–15)
Albumin: 3.3 g/dL — ABNORMAL LOW (ref 3.5–5.0)
BUN: 54 mg/dL — ABNORMAL HIGH (ref 8–23)
CALCIUM: 8.8 mg/dL — AB (ref 8.9–10.3)
CO2: 35 mmol/L — AB (ref 22–32)
Chloride: 97 mmol/L — ABNORMAL LOW (ref 98–111)
Creatinine, Ser: 1.5 mg/dL — ABNORMAL HIGH (ref 0.61–1.24)
GFR calc non Af Amer: 40 mL/min — ABNORMAL LOW (ref >60)
GFR, EST AFRICAN AMERICAN: 46 mL/min — AB (ref >60)
Glucose, Bld: 137 mg/dL — ABNORMAL HIGH (ref 70–99)
POTASSIUM: 5.3 mmol/L — AB (ref 3.5–5.1)
Phosphorus: 4.7 mg/dL — ABNORMAL HIGH (ref 2.5–4.6)
Sodium: 138 mmol/L (ref 135–145)

## 2017-08-20 MED ORDER — TORSEMIDE 20 MG PO TABS
40.0000 mg | ORAL_TABLET | Freq: Two times a day (BID) | ORAL | Status: DC
Start: 2017-08-20 — End: 2017-08-21
  Administered 2017-08-20 – 2017-08-21 (×3): 40 mg via ORAL
  Filled 2017-08-20 (×3): qty 2

## 2017-08-20 MED ORDER — OXYCODONE-ACETAMINOPHEN 5-325 MG PO TABS
1.0000 | ORAL_TABLET | Freq: Four times a day (QID) | ORAL | Status: DC | PRN
  Administered 2017-08-20: 1 via ORAL
  Filled 2017-08-20: qty 1

## 2017-08-20 MED ORDER — AZITHROMYCIN 250 MG PO TABS
250.0000 mg | ORAL_TABLET | Freq: Every day | ORAL | Status: AC
  Administered 2017-08-20 – 2017-08-22 (×3): 250 mg via ORAL
  Filled 2017-08-20 (×3): qty 1

## 2017-08-20 MED ORDER — CEFDINIR 300 MG PO CAPS
300.0000 mg | ORAL_CAPSULE | Freq: Two times a day (BID) | ORAL | Status: DC
  Administered 2017-08-20 – 2017-08-22 (×5): 300 mg via ORAL
  Filled 2017-08-20 (×6): qty 1

## 2017-08-20 MED ORDER — CLOTRIMAZOLE 1 % EX CREA
TOPICAL_CREAM | Freq: Two times a day (BID) | CUTANEOUS | Status: DC
  Administered 2017-08-20 – 2017-08-22 (×4): via TOPICAL
  Filled 2017-08-20 (×2): qty 15

## 2017-08-20 NOTE — Progress Notes (Addendum)
Cardiovascular and Pulmonary Nurse Navigator Note:    84 year old male with restrictive lung disease on home oxygen, CHF, atrial fibrillation, CAD, HTN, adrenal mass, HLD, PVD, who was admitted to [facility] on 08/18/2017 for acute exacerbation of diastolic CHF.    Patient is being followed by Renal, Cardiology, Hospitalist.    Active Problem List this Admission:   1. Acute hypoxic respiratory failure - secondary to acute on chronic diastolic heart failure. 2. Restrictive Lung Disease - Patient on 2 liters of home oxygen.   3. Acute Renal Failure on CKD III \ 4. Hyperkalemia 5. Afib controlled 6. HTN  7. Normocytic anemia   Patient on the following cardiac medications:  Torsemide, Metoprolol, Aldactone, Lipitor, Eliquis.    CHF Education:?? Educational session with patient and daughter completed.   CHF is not a new diagnosis for this patient.    Provided patient / daughter with "Living Better with Heart Failure" packet. Briefly reviewed definition of heart failure and signs and symptoms of an exacerbation.?Explained to patient that HF is a chronic illness which requires self-assessment / self-management along with help from the cardiologist/PCP.?? ? *Reviewed importance of and reason behind checking weight daily in the AM, after using the bathroom, but before getting dressed. Patient has scales.  ? *Reviewed with patient the following information: *Discussed when to call the Dr= weight gain of >2-3lb overnight of 5lb in a week,  *Discussed yellow zone= call MD: weight gain of >2-3lb overnight of 5lb in a week, increased swelling, increased SOB when lying down, chest discomfort, dizziness, increased fatigue *Red Zone= call 911: struggle to breath, fainting or near fainting, significant chest pain  ? *Diet - Reviewed low sodium diet with patient and daughter.  Patient's daughter and wife have been preparing and purchasing low sodium foods for patient.    ? *Discussed fluid intake with patient  as well. Patient  currently on a 1800 ml fluid restriction.  Demonstrated to patient and daughter what this amount looks like using the bedside water pitcher.   ? *Instructed patient to take medications as prescribed for heart failure. Explained briefly why pt is on the medications (either make you feel better, live longer or keep you out of the hospital) and discussed monitoring and side effects. Patient informed this RN that since he has been on Aldactone he has had hair growth over his balding spot on his head as well as enlargement of breasts.   ? *Discussed exercise. Encouraged patient to remain as active as possible.  PT evaluated patient and has recommended no PT follow-up needed.   Patient is planning to go to [place] with his family the first of August.  ? *Smoking Cessation- Patient is a former smoker.? ? *[facility] - Patient is already being followed in the [facility].  Patient's next appointment is scheduled for 08/31/2017 at 11:40 a.m.   ? Again, the 5 Steps to Living Better with Heart Failure were reviewed with patient / daughter.    ? Patient thanked me for providing the above information. ? ? Roanna Epley, RN, BSN, [facility]? [facility]  Cardiovascular &?Pulmonary Nurse Navigator  Direct Line: [phone]  Department Phone #: [phone] Fax: [phone]? Email Address: [email or web address]

## 2017-08-20 NOTE — Progress Notes (Signed)
Pt with una boots to lower extremities. This nurse called into room. Pt and wife wanting the left una boot taken off. Pt stating that there is a sharpe pain "shooting" through the lower half close to ankle. Informed pt and wife that the wound clinic would like to keep them on because of the weeping. Pt stating that he feels that it is wrapped to tight and would like it off. Suggested to try and elevate the leg to see if pain would go away. Pt and wife wanting una boot taken off. Una boot taken off, but right una boot remains in place. Will continue to monitor and assess.

## 2017-08-20 NOTE — Progress Notes (Addendum)
[facility] [place] NAME: Norman Morales    MR#:  [id number]  DATE OF BIRTH:  Jan 16, 1927  SUBJECTIVE:   Patient states he is doing well. States his shortness of breath has improved. He has no concerns this morning.  REVIEW OF SYSTEMS:  Review of Systems  Constitutional: Negative for chills and fever.  Respiratory: Negative for cough, shortness of breath and wheezing.   Cardiovascular: Positive for leg swelling. Negative for chest pain, palpitations and orthopnea.  Gastrointestinal: Negative for nausea and vomiting.  Musculoskeletal: Positive for back pain.  Neurological: Negative for dizziness and headaches.    DRUG ALLERGIES:   Allergies  Allergen Reactions  . Ambien [Zolpidem Tartrate] Other (See Comments)    Nightmares/ yelling out    VITALS:  Blood pressure (!) 151/68, pulse 88, temperature 97.7 F (36.5 C), temperature source Oral, resp. rate 18, height 6' (1.829 m), weight 124.9 kg (275 lb 4.8 oz), SpO2 93 %. PHYSICAL EXAMINATION:  Physical Exam  General: well-appearing, sitting up in chair, in NAD, pleasant and conversational HEENT: [place]/AT, EOMI, PERRLA, no scleral icterus Neck: No JVD, supple, normal ROM CV: RRR, no murmurs, rubs, or gallops Resp: Normal work of breathing, +mild bibasilar crackles, improved from previous exam GI: +BS, soft, non-tender, non-distended Msk: bilateral unna boots in place, 2+ pitting edema bilaterally Neuro: CN 2-12 grossly intact, normal strength, normal sensation, no focal deficits Skin: No rashes or lesions on exposed skin Psych: Normal behavior, appropriate affect  LABORATORY PANEL:  Male CBC Recent Labs  Lab 08/20/17 0527  WBC 14.2*  HGB 11.7*  HCT 35.7*  PLT 172   ------------------------------------------------------------------------------------------------------------------ Chemistries  Recent Labs  Lab 08/20/17 0527  NA 138  K 5.3*  CL 97*  CO2 35*  GLUCOSE 137*    BUN 54*  CREATININE 1.50*  CALCIUM 8.8*   RADIOLOGY:  US Renal  Result Date: 08/19/2017 CLINICAL DATA:  Acute kidney injury, chronic kidney disease stage 3 EXAM: RENAL / URINARY TRACT ULTRASOUND COMPLETE COMPARISON:  CT 09/20/2015 FINDINGS: Right Kidney: Length: 10.5 cm. 2.3 cm cyst. Normal echotexture. Mild cortical thinning. No hydronephrosis. Left Kidney: Length: 11.7 cm. 1.2 cm lower pole cyst. Mild cortical thinning. Normal echotexture. No hydronephrosis. Rounded soft tissue area noted adjacent to the mid to lower pole of the left kidney. This does not appear to be definitively arising from the left kidney. This may simply reflect prominent perinephric fat. I am not convinced this is a true lesion. Bladder: Appears normal for degree of bladder distention. IMPRESSION: Mild cortical thinning bilaterally.  No hydronephrosis. Rounded soft tissue area adjacent to the mid to lower pole of the left kidney. I do not believe this is arising from the left kidney and may simply represent prominent perinephric fat. CT abdomen with contrast could be performed to completely exclude exophytic renal lesion. Electronically Signed   By: Rolm Baptise M.D.   On: 08/19/2017 18:54   ASSESSMENT AND PLAN:   Norman Morales  is a 84 y.o. male with a known history of atrial fibrillation on Eliquis, chronic diastolic CHF on 2 L home oxygen, CKD stage III, hypertension, restrictive lung disease presents to hospital from PCPs office secondary to hypoxia and worsening respiratory distress.  1. Acute hypoxic respiratory failure- secondary to acute on chronic diastolic heart failure and RLL pneumonia. Currently on 3L O2 (home 2L). - switch from IV ceftriaxone and azithromycin to po omnicef and azithromycin today. - switch from IV Lasix 60mg  tid  to home torsemide 40mg  bid - switch from solumedrol 60mg  IV to prednisone 50mg  daily today - monitor I's and O's, daily weights - incentive spirometry - duonebs prn  2.   Restrictive lung disease - duonebs prn - continue O2 supplementation (on 2L O2 at chronically at home) - switch from solumedrol 60mg  IV to prednisone 50mg  daily today  3.  Acute renal failure on CKD III- baseline Cr ~1.3 (cr ~1.5 has been here)- likely secondary to CHF and IV diuresis - nephrology following - change from IV lasix to home torsemide today - renal US with mild cortical thinning and no hydronephrosis, also with a rounded soft tissue area in the mid to lower pole of the left kidney that may be prominent perinephric fat (PCP or nephrologist can get outpatient CT abdomen w/ contrast to evaluate further) - daily renal function panel  4. Hyperkalemia- K 5.3 today - discussed with nephro who would like to hold meds the same for now and recheck in the morning - currently getting spironolactone every other day (will not get this today)  5. Afib- rate controlled - continue eliquis 2.5mg  bid and lopressor bid  6. HTN- BPs at goal for age - lasix, metoprolol, aldactone  7. Normocytic anemia- likely due to CKD III. Stable and near baseline. - monitor  All the records are reviewed and case discussed with Care Management/Social Worker. Management plans discussed with the patient, family and they are in agreement.  CODE STATUS: Full Code  TOTAL TIME TAKING CARE OF THIS PATIENT: 30 minutes.   More than 50% of the time was spent in counseling/coordination of care: YES  POSSIBLE D/C TOMORROW, DEPENDING ON CLINICAL CONDITION.   Berna Spare Pride Gonzales M.D on 08/20/2017 at 10:42 AM  Between 7am to 6pm - Pager - [phone]  After 6pm go to [email or web address] - password [email or web address] [facility]  [facility]  Office  [phone]  CC: Primary care physician; Adin Hector, MD  Note: This dictation was prepared with Dragon dictation along with smaller phrase technology. Any transcriptional errors that result from this process are unintentional.

## 2017-08-20 NOTE — Care Management Important Message (Signed)
Copy of signed IM left with patient in room.  

## 2017-08-20 NOTE — Consult Note (Addendum)
[place] for apixaban Indication: atrial fibrillation  Allergies  Allergen Reactions  . Ambien [Zolpidem Tartrate] Other (See Comments)    Nightmares/ yelling out     Patient Measurements: Height: 6' (182.9 cm) Weight: 275 lb 4.8 oz (124.9 kg) IBW/kg (Calculated) : 77.6 Heparin Dosing Weight:   Vital Signs: Temp: 98.1 F (36.7 C) (07/19 0347) Temp Source: Oral (07/19 0347) BP: 143/69 (07/19 0347) Pulse Rate: 72 (07/19 0347)  Labs: Recent Labs    08/18/17 1206 08/19/17 1339 08/20/17 0527  HGB 12.0* 12.1* 11.7*  HCT 35.8* 37.0* 35.7*  PLT 164 170 172  CREATININE 1.58* 1.37* 1.50*  TROPONINI <0.03  --   --     Estimated Creatinine Clearance: 45.6 mL/min (A) (by C-G formula based on SCr of 1.5 mg/dL (H)).   Medical History: Past Medical History:  Diagnosis Date  . A-fib ([place])   . Adrenal adenoma   . CHF (congestive heart failure) ([place])   . Coronary artery disease   . DDD (degenerative disc disease), cervical   . Hyperlipidemia   . Hypertension   . Lung nodule   . Restrictive lung disease     Medications:  Scheduled:  . allopurinol  200 mg Oral Daily  . apixaban  2.5 mg Oral BID  . atorvastatin  80 mg Oral QHS  . furosemide  60 mg Intravenous Q8H  . gabapentin  300 mg Oral q morning - 10a  . gabapentin  600 mg Oral QHS  . ipratropium-albuterol  3 mL Nebulization Q6H WA  . mouth rinse  15 mL Mouth Rinse BID  . metoprolol tartrate  50 mg Oral BID  . predniSONE  50 mg Oral Q breakfast  . sodium chloride flush  3 mL Intravenous Q12H  . spironolactone  25 mg Oral QODAY  . umeclidinium-vilanterol  1 puff Inhalation Daily    Assessment: Patient is a 84 year old male on apixaban PTA. Pt was on 5mg  BID. Pharmacy consulted to review if needs to be renally adjusted. On 7/17 the dose was adjusted to 2.5mg  twice daily based on the following criteria: Pt SCr is > 1.5 and Pt is >30 years old. His SCr has varied, dipping below  the 1.5 threshold and today is 1.50. Baseline SCr is 1.14. He is in acute renal failure secondary to acute cardiorenal syndrome, which does not appear to have resolved yet.  Goal of Therapy:   Monitor platelets by anticoagulation protocol: Yes   Plan:  Until acute cardiorenal syndrome has resolved we will maintain apixaban dose at 2.5mg  twice daily.  Vallery Sa, PhamD Clinical Pharmacist 08/20/2017,7:24 AM

## 2017-08-20 NOTE — Plan of Care (Signed)
  Problem: Clinical Measurements: Goal: Respiratory complications will improve Outcome: Progressing   

## 2017-08-20 NOTE — Progress Notes (Signed)
[facility]  ROUNDING NOTE   Subjective:   UOP 1600 K 5.3 (4.4)  Creatinine 1.5 (1.37)  Daughter at bedside.   Objective:  Vital signs in last 24 hours:  Temp:  [97.7 F (36.5 C)-98.1 F (36.7 C)] 97.7 F (36.5 C) (07/19 0806) Pulse Rate:  [49-88] 88 (07/19 1000) Resp:  [18] 18 (07/19 0347) BP: (143-151)/(62-78) 151/68 (07/19 0806) SpO2:  [93 %-97 %] 93 % (07/19 1000) Weight:  [124.9 kg (275 lb 4.8 oz)] 124.9 kg (275 lb 4.8 oz) (07/19 0347)  Weight change: 1.043 kg (2 lb 4.8 oz) Filed Weights   08/18/17 1152 08/19/17 0340 08/20/17 0347  Weight: 123.8 kg (273 lb) 121.9 kg (268 lb 11.2 oz) 124.9 kg (275 lb 4.8 oz)    Intake/Output: I/O last 3 completed shifts: In: 480 [P.O.:480] Out: 3300 [Urine:3300]   Intake/Output this shift:  Total I/O In: -  Out: 225 [Urine:225]  Physical Exam: General: NAD, sitting up    Head: Normocephalic, atraumatic. Moist oral mucosal membranes  Eyes: Anicteric, PERRL  Neck: Supple, trachea midline  Lungs:  Clear to auscultation  Heart: Regular rate and rhythm  Abdomen:  Soft, nontender,   Extremities: ++ peripheral edema. Bilateral UNNA boots  Neurologic: Nonfocal, moving all four extremities  Skin: No lesions        Basic Metabolic Panel: Recent Labs  Lab 08/16/17 1533 08/18/17 1206 08/19/17 1339 08/20/17 0527  NA 138 135 137 138  K 4.2 3.8 4.4 5.3*  CL 93* 92* 94* 97*  CO2 33* 33* 35* 35*  GLUCOSE 105* 130* 146* 137*  BUN 59* 55* 48* 54*  CREATININE 1.79* 1.58* 1.37* 1.50*  CALCIUM 8.8* 8.9 9.0 8.8*  PHOS  --   --   --  4.7*    Liver Function Tests: Recent Labs  Lab 08/20/17 0527  ALBUMIN 3.3*   No results for input(s): LIPASE, AMYLASE in the last 168 hours. No results for input(s): AMMONIA in the last 168 hours.  CBC: Recent Labs  Lab 08/18/17 1206 08/19/17 1339 08/20/17 0527  WBC 9.2 10.1 14.2*  HGB 12.0* 12.1* 11.7*  HCT 35.8* 37.0* 35.7*  MCV 94.0 95.7 95.3  PLT 164 170 172     Cardiac Enzymes: Recent Labs  Lab 08/18/17 1206  TROPONINI <0.03    BNP: Invalid input(s): POCBNP  CBG: No results for input(s): GLUCAP in the last 168 hours.  Microbiology: Results for orders placed or performed during the hospital encounter of 12/23/16  Blood Culture (routine x 2)     Status: None   Collection Time: 12/23/16  4:53 AM  Result Value Ref Range Status   Specimen Description BLOOD  Final   Special Requests NONE  Final   Culture NO GROWTH 5 DAYS  Final   Report Status 12/28/2016 FINAL  Final  Blood Culture (routine x 2)     Status: None   Collection Time: 12/23/16  4:53 AM  Result Value Ref Range Status   Specimen Description BLOOD  Final   Special Requests NONE  Final   Culture NO GROWTH 5 DAYS  Final   Report Status 12/28/2016 FINAL  Final    Coagulation Studies: No results for input(s): LABPROT, INR in the last 72 hours.  Urinalysis: Recent Labs    08/19/17 1633  COLORURINE YELLOW*  LABSPEC 1.011  PHURINE 6.0  GLUCOSEU NEGATIVE  HGBUR NEGATIVE  BILIRUBINUR NEGATIVE  KETONESUR NEGATIVE  PROTEINUR NEGATIVE  NITRITE NEGATIVE  LEUKOCYTESUR NEGATIVE  Imaging: Dg Chest 2 View  Result Date: 08/18/2017 CLINICAL DATA:  Shortness of breath. EXAM: CHEST - 2 VIEW COMPARISON:  Chest x-ray dated Jun 09, 2017. FINDINGS: Stable cardiomegaly status post CABG. Mild pulmonary vascular congestion. Unchanged scarring in the left lower lobe. Increasing patchy opacity in the right lower lobe. No focal consolidation, pleural effusion, or pneumothorax. No acute osseous abnormality. Multiple fractured sternal wires are unchanged. IMPRESSION: 1. Increased patchy opacity in the right lower lobe could reflect atelectasis or infiltrate. 2. Mild pulmonary vascular congestion without overt edema. Electronically Signed   By: Titus Dubin M.D.   On: 08/18/2017 13:02   US Renal  Result Date: 08/19/2017 CLINICAL DATA:  Acute kidney injury, chronic kidney disease  stage 3 EXAM: RENAL / URINARY TRACT ULTRASOUND COMPLETE COMPARISON:  CT 09/20/2015 FINDINGS: Right Kidney: Length: 10.5 cm. 2.3 cm cyst. Normal echotexture. Mild cortical thinning. No hydronephrosis. Left Kidney: Length: 11.7 cm. 1.2 cm lower pole cyst. Mild cortical thinning. Normal echotexture. No hydronephrosis. Rounded soft tissue area noted adjacent to the mid to lower pole of the left kidney. This does not appear to be definitively arising from the left kidney. This may simply reflect prominent perinephric fat. I am not convinced this is a true lesion. Bladder: Appears normal for degree of bladder distention. IMPRESSION: Mild cortical thinning bilaterally.  No hydronephrosis. Rounded soft tissue area adjacent to the mid to lower pole of the left kidney. I do not believe this is arising from the left kidney and may simply represent prominent perinephric fat. CT abdomen with contrast could be performed to completely exclude exophytic renal lesion. Electronically Signed   By: Rolm Baptise M.D.   On: 08/19/2017 18:54     Medications:    . allopurinol  200 mg Oral Daily  . apixaban  2.5 mg Oral BID  . atorvastatin  80 mg Oral QHS  . azithromycin  250 mg Oral Daily  . cefdinir  300 mg Oral Q12H  . gabapentin  300 mg Oral q morning - 10a  . gabapentin  600 mg Oral QHS  . ipratropium-albuterol  3 mL Nebulization Q6H WA  . mouth rinse  15 mL Mouth Rinse BID  . metoprolol tartrate  50 mg Oral BID  . predniSONE  50 mg Oral Q breakfast  . sodium chloride flush  3 mL Intravenous Q12H  . spironolactone  25 mg Oral QODAY  . torsemide  40 mg Oral BID  . umeclidinium-vilanterol  1 puff Inhalation Daily   azelastine, Melatonin, ondansetron (ZOFRAN) IV, polyethylene glycol  Assessment/ Plan:  [name] is a 84 y.o. white male with restrictive lung disease on O2, congestive heart failure, atrial fibrillation, coronary artery disease, hypertension, adrenal mass, hyperlipidemia, peripheral  vascular disease, who was admitted to [facility] on 08/18/2017 for acute exacerbation of diastolic congestive heart failure  1. Acute renal failure with hyperkalemia on chronic kidney disease stage III: urine bland. Ultrasound without obstruction.  Baseline creatinine of 1.14, GFR of 55 on 06/10/17.  Acute renal failure secondary to acute cardiorenal syndrome  2. Hypertension with acute exacerbation of diastolic dysfunction and atrial fibrillation Home regimen of torsemide 20mg  bid, spirinolactone 25mg  every other day, and metoprolol - Change back to torsemide.  - Continue spironolactone at current dose, if potassium rises, consider lower dose of spironolactone - Fluid restriction - Low salt diet   LOS: 2 Nitish Roes 7/19/201911:42 AM

## 2017-08-20 NOTE — Consult Note (Signed)
[place] nurse into visit with patient today to assess the LLE compression wrap. Because the leg was so actively weeping large amounts of clear drainage yesterday I was concerned that the wrap would be saturated today and I did not want to leave the compression wrap on over the weekend if wet.   Today both LE Unna's boots are intact and dry. Will plan for Q week changes on Thursdays while inpatient. It does sound as if patient may DC over the weekend.   Will need HHRN or outpatient visit to PCP other provider for weekly Unna's boot changes.  [place], [place], [place]

## 2017-08-21 ENCOUNTER — Inpatient Hospital Stay: Payer: Medicare Other

## 2017-08-21 LAB — CBC
HEMATOCRIT: 37.6 % — AB (ref 40.0–52.0)
Hemoglobin: 12 g/dL — ABNORMAL LOW (ref 13.0–18.0)
MCH: 30.8 pg (ref 26.0–34.0)
MCHC: 32.1 g/dL (ref 32.0–36.0)
MCV: 96.1 fL (ref 80.0–100.0)
Platelets: 176 10*3/uL (ref 150–440)
RBC: 3.91 MIL/uL — ABNORMAL LOW (ref 4.40–5.90)
RDW: 19.2 % — ABNORMAL HIGH (ref 11.5–14.5)
WBC: 11 10*3/uL — ABNORMAL HIGH (ref 3.8–10.6)

## 2017-08-21 LAB — BASIC METABOLIC PANEL
Anion gap: 8 (ref 5–15)
BUN: 59 mg/dL — ABNORMAL HIGH (ref 8–23)
CALCIUM: 8.9 mg/dL (ref 8.9–10.3)
CO2: 36 mmol/L — ABNORMAL HIGH (ref 22–32)
Chloride: 91 mmol/L — ABNORMAL LOW (ref 98–111)
Creatinine, Ser: 1.61 mg/dL — ABNORMAL HIGH (ref 0.61–1.24)
GFR calc Af Amer: 42 mL/min — ABNORMAL LOW (ref >60)
GFR, EST NON AFRICAN AMERICAN: 36 mL/min — AB (ref >60)
Glucose, Bld: 142 mg/dL — ABNORMAL HIGH (ref 70–99)
POTASSIUM: 5.2 mmol/L — AB (ref 3.5–5.1)
Sodium: 135 mmol/L (ref 135–145)

## 2017-08-21 LAB — MAGNESIUM: Magnesium: 2.4 mg/dL (ref 1.7–2.4)

## 2017-08-21 MED ORDER — FUROSEMIDE 10 MG/ML IJ SOLN
40.0000 mg | Freq: Two times a day (BID) | INTRAMUSCULAR | Status: DC
Start: 2017-08-21 — End: 2017-08-22
  Administered 2017-08-21 – 2017-08-22 (×2): 40 mg via INTRAVENOUS
  Filled 2017-08-21 (×2): qty 4

## 2017-08-21 MED ORDER — LACTULOSE 10 GM/15ML PO SOLN
30.0000 g | Freq: Two times a day (BID) | ORAL | Status: DC
  Administered 2017-08-21 (×2): 30 g via ORAL
  Filled 2017-08-21 (×3): qty 60

## 2017-08-21 MED ORDER — GUAIFENESIN-CODEINE 100-10 MG/5ML PO SOLN
10.0000 mL | Freq: Four times a day (QID) | ORAL | Status: DC | PRN
  Administered 2017-08-21: 10 mL via ORAL
  Filled 2017-08-21: qty 10

## 2017-08-21 MED ORDER — FLEET ENEMA 7-19 GM/118ML RE ENEM: 1.0000 | ENEMA | Freq: Every day | RECTAL | Status: DC | PRN

## 2017-08-21 NOTE — Progress Notes (Signed)
Pt ambulated at length in hallway with family, using walker. tol well gailt steady. C/o constipation. Given miralax, lactulose t no avail. Given fleet enema and partial disimpaction with results. Pt up in chair most of day.

## 2017-08-21 NOTE — Plan of Care (Signed)
  Problem: Clinical Measurements: Goal: Respiratory complications will improve Outcome: Progressing   Problem: Elimination: Goal: Will not experience complications related to bowel motility Outcome: Not Progressing

## 2017-08-21 NOTE — Progress Notes (Signed)
Patient ID: Norman Morales, male   DOB: [birth year], 84 y.o.   MRN: [id number]  [place] PROGRESS NOTE  Norman Morales HQI:[id number] DOB: [birth year] DOA: 08/18/2017 PCP: Adin Hector, MD  HPI/Subjective: Patient initially stated that he was feeling better than when he came in but breathing not quite back to normal.  Then he asked me if he can go home.  He still having shortness of breath.  Had pain and his left lower extremity so they cut off his Unna boot last night and now has weeping edema there.  Objective: Vitals:   08/21/17 0743 08/21/17 1349  BP: (!) 152/81   Pulse: (!) 54   Resp: 20   Temp: 97.8 F (36.6 C)   SpO2: 96% 92%    Filed Weights   08/19/17 0340 08/20/17 0347 08/21/17 0451  Weight: 121.9 kg (268 lb 11.2 oz) 124.9 kg (275 lb 4.8 oz) 125.8 kg (277 lb 4.8 oz)    ROS: Review of Systems  Constitutional: Negative for chills and fever.  Eyes: Negative for blurred vision.  Respiratory: Positive for cough and shortness of breath.   Cardiovascular: Negative for chest pain.  Gastrointestinal: Positive for constipation. Negative for abdominal pain, diarrhea, nausea and vomiting.  Genitourinary: Negative for dysuria.  Musculoskeletal: Negative for joint pain.  Neurological: Negative for dizziness and headaches.   Exam: Physical Exam  Constitutional: He is oriented to person, place, and time.  HENT:  Nose: No mucosal edema.  Mouth/Throat: No oropharyngeal exudate or posterior oropharyngeal edema.  Eyes: Pupils are equal, round, and reactive to light. Conjunctivae, EOM and lids are normal.  Neck: No JVD present. Carotid bruit is not present. No edema present. No thyroid mass and no thyromegaly present.  Cardiovascular: S1 normal and S2 normal. Exam reveals no gallop.  No murmur heard. Pulses:      Dorsalis pedis pulses are 2+ on the right side, and 2+ on the left side.  Respiratory: No respiratory distress. He has decreased breath sounds in the right  lower field and the left lower field. He has no wheezes. He has no rhonchi. He has rales in the right lower field and the left lower field.  GI: Soft. Bowel sounds are normal. There is no tenderness.  Musculoskeletal:       Right ankle: He exhibits swelling.       Left ankle: He exhibits swelling.  Weeping edema left leg.  Right leg covered with Unna boot  Lymphadenopathy:    He has no cervical adenopathy.  Neurological: He is alert and oriented to person, place, and time. No cranial nerve deficit.  Skin: Skin is warm. No rash noted. Nails show no clubbing.  Psychiatric: He has a normal mood and affect.      Data Reviewed: Basic Metabolic Panel: Recent Labs  Lab 08/16/17 1533 08/18/17 1206 08/19/17 1339 08/20/17 0527 08/21/17 0822  NA 138 135 137 138 135  K 4.2 3.8 4.4 5.3* 5.2*  CL 93* 92* 94* 97* 91*  CO2 33* 33* 35* 35* 36*  GLUCOSE 105* 130* 146* 137* 142*  BUN 59* 55* 48* 54* 59*  CREATININE 1.79* 1.58* 1.37* 1.50* 1.61*  CALCIUM 8.8* 8.9 9.0 8.8* 8.9  MG  --   --   --   --  2.4  PHOS  --   --   --  4.7*  --    Liver Function Tests: Recent Labs  Lab 08/20/17 0527  ALBUMIN 3.3*   CBC: Recent Labs  Lab 08/18/17 1206 08/19/17 1339 08/20/17 0527 08/21/17 0822  WBC 9.2 10.1 14.2* 11.0*  HGB 12.0* 12.1* 11.7* 12.0*  HCT 35.8* 37.0* 35.7* 37.6*  MCV 94.0 95.7 95.3 96.1  PLT 164 170 172 176   Cardiac Enzymes: Recent Labs  Lab 08/18/17 1206  TROPONINI <0.03   BNP (last 3 results) Recent Labs    07/13/17 1416 08/16/17 1533 08/18/17 1206  BNP 312.0* 321.0* 273.0*     Studies: Dg Chest 2 View  Result Date: 08/21/2017 CLINICAL DATA:  Cough. EXAM: CHEST - 2 VIEW COMPARISON:  08/18/2017 FINDINGS: Sequelae of CABG are again identified with multiple fractured sternal wires. The cardiac silhouette remains enlarged. Pulmonary vascular congestion is stable to mildly increased. There is chronic nodularity in the left lung apex. Streaky opacities in both lung  bases are similar to the prior study and suggest atelectasis. No pleural effusion or pneumothorax is identified. IMPRESSION: 1. Cardiomegaly with mildly worsening pulmonary vascular congestion. 2. Low lung volumes with bibasilar atelectasis. Electronically Signed   By: Logan Bores M.D.   On: 08/21/2017 10:53   US Renal  Result Date: 08/19/2017 CLINICAL DATA:  Acute kidney injury, chronic kidney disease stage 3 EXAM: RENAL / URINARY TRACT ULTRASOUND COMPLETE COMPARISON:  CT 09/20/2015 FINDINGS: Right Kidney: Length: 10.5 cm. 2.3 cm cyst. Normal echotexture. Mild cortical thinning. No hydronephrosis. Left Kidney: Length: 11.7 cm. 1.2 cm lower pole cyst. Mild cortical thinning. Normal echotexture. No hydronephrosis. Rounded soft tissue area noted adjacent to the mid to lower pole of the left kidney. This does not appear to be definitively arising from the left kidney. This may simply reflect prominent perinephric fat. I am not convinced this is a true lesion. Bladder: Appears normal for degree of bladder distention. IMPRESSION: Mild cortical thinning bilaterally.  No hydronephrosis. Rounded soft tissue area adjacent to the mid to lower pole of the left kidney. I do not believe this is arising from the left kidney and may simply represent prominent perinephric fat. CT abdomen with contrast could be performed to completely exclude exophytic renal lesion. Electronically Signed   By: Rolm Baptise M.D.   On: 08/19/2017 18:54    Scheduled Meds: . allopurinol  200 mg Oral Daily  . apixaban  2.5 mg Oral BID  . atorvastatin  80 mg Oral QHS  . azithromycin  250 mg Oral Daily  . cefdinir  300 mg Oral Q12H  . clotrimazole   Topical BID  . furosemide  40 mg Intravenous BID  . gabapentin  300 mg Oral q morning - 10a  . gabapentin  600 mg Oral QHS  . ipratropium-albuterol  3 mL Nebulization Q6H WA  . lactulose  30 g Oral BID  . mouth rinse  15 mL Mouth Rinse BID  . metoprolol tartrate  50 mg Oral BID  . sodium  chloride flush  3 mL Intravenous Q12H  . spironolactone  25 mg Oral QODAY  . umeclidinium-vilanterol  1 puff Inhalation Daily   Continuous Infusions:  Assessment/Plan:  1. Acute on chronic hypoxic respiratory failure.  Currently on 3 L of oxygen. 2. Acute on chronic diastolic congestive heart failure, lower extremity anasarca repeat chest x-ray today shows worsening fluid in the lungs.  Switch back to IV Lasix 40 mg IV twice daily. 3. COPD exacerbation with restrictive lung disease continue nebulizer treatments.  Discontinue steroids after today's dose which would be 5 days. 4. Right lower lobe pneumonia seen on initial chest x-ray but not this chest x-ray.  Finish course of antibiotics with Zithromax and Omnicef. 5. Acute kidney injury on chronic kidney disease stage III.  Switch back to IV Lasix and continue to monitor creatinine 6. Relative hyperkalemia.  Lasix should help.  May have to cut back on Spironolactone. 7. Atrial fibrillation on Eliquis and metoprolol 8. Essential hypertension on Lasix metoprolol and Aldactone 9. Constipation ordered lactulose  Code Status:     Code Status Orders  (From admission, onward)        Start     Ordered   08/18/17 1829  Full code  Continuous     08/18/17 1828    Code Status History    Date Active Date Inactive Code Status Order ID Comments User Context   06/04/2017 1414 06/10/2017 1744 DNR [id number]  Hillary Bow, MD Inpatient   06/04/2017 1244 06/04/2017 1414 Full Code [id number]  Hillary Bow, MD Inpatient   12/23/2016 0806 12/27/2016 1617 Full Code [id number]  Lance Coon, MD Inpatient   12/23/2016 0714 12/23/2016 0806 Full Code [id number]  Hillary Bow, MD ED   12/30/2015 1429 12/30/2015 1859 Full Code [id number]  Dew, Erskine Squibb, MD Inpatient    Advance Directive Documentation     Most Recent Value  Type of Advance Directive  Living will  Pre-existing out of facility DNR order (yellow form or pink MOST form)  -  "MOST" Form in Place?  -      Family Communication: Wife and daughter at the bedside Disposition Plan: Home once breathing better  Consultants:  Nephrology  Cardiology  Antibiotics:  Zithromax and Omnicef  Time spent: 35 minutes including ACP time  [employer]

## 2017-08-21 NOTE — Progress Notes (Signed)
[facility]  ROUNDING NOTE   Subjective:   Seated in chair.   UOP 1575  Objective:  Vital signs in last 24 hours:  Temp:  [97.4 F (36.3 C)-97.8 F (36.6 C)] 97.8 F (36.6 C) (07/20 0743) Pulse Rate:  [54-85] 54 (07/20 0743) Resp:  [16-20] 20 (07/20 0743) BP: (140-152)/(64-86) 152/81 (07/20 0743) SpO2:  [89 %-96 %] 96 % (07/20 0743) Weight:  [125.8 kg (277 lb 4.8 oz)] 125.8 kg (277 lb 4.8 oz) (07/20 0451)  Weight change: 0.907 kg (2 lb) Filed Weights   08/19/17 0340 08/20/17 0347 08/21/17 0451  Weight: 121.9 kg (268 lb 11.2 oz) 124.9 kg (275 lb 4.8 oz) 125.8 kg (277 lb 4.8 oz)    Intake/Output: I/O last 3 completed shifts: In: -  Out: 2575 [Urine:2575]   Intake/Output this shift:  No intake/output data recorded.  Physical Exam: General: NAD, sitting up    Head: Normocephalic, atraumatic. Moist oral mucosal membranes  Eyes: Anicteric, PERRL  Neck: Supple, trachea midline  Lungs:  Clear to auscultation  Heart: Regular rate and rhythm  Abdomen:  Soft, nontender,   Extremities: + peripheral edema.    Neurologic: Nonfocal, moving all four extremities  Skin: No lesions        Basic Metabolic Panel: Recent Labs  Lab 08/16/17 1533 08/18/17 1206 08/19/17 1339 08/20/17 0527 08/21/17 0822  NA 138 135 137 138 135  K 4.2 3.8 4.4 5.3* 5.2*  CL 93* 92* 94* 97* 91*  CO2 33* 33* 35* 35* 36*  GLUCOSE 105* 130* 146* 137* 142*  BUN 59* 55* 48* 54* 59*  CREATININE 1.79* 1.58* 1.37* 1.50* 1.61*  CALCIUM 8.8* 8.9 9.0 8.8* 8.9  PHOS  --   --   --  4.7*  --     Liver Function Tests: Recent Labs  Lab 08/20/17 0527  ALBUMIN 3.3*   No results for input(s): LIPASE, AMYLASE in the last 168 hours. No results for input(s): AMMONIA in the last 168 hours.  CBC: Recent Labs  Lab 08/18/17 1206 08/19/17 1339 08/20/17 0527 08/21/17 0822  WBC 9.2 10.1 14.2* 11.0*  HGB 12.0* 12.1* 11.7* 12.0*  HCT 35.8* 37.0* 35.7* 37.6*  MCV 94.0 95.7 95.3 96.1  PLT 164  170 172 176    Cardiac Enzymes: Recent Labs  Lab 08/18/17 1206  TROPONINI <0.03    BNP: Invalid input(s): POCBNP  CBG: No results for input(s): GLUCAP in the last 168 hours.  Microbiology: Results for orders placed or performed during the hospital encounter of 12/23/16  Blood Culture (routine x 2)     Status: None   Collection Time: 12/23/16  4:53 AM  Result Value Ref Range Status   Specimen Description BLOOD  Final   Special Requests NONE  Final   Culture NO GROWTH 5 DAYS  Final   Report Status 12/28/2016 FINAL  Final  Blood Culture (routine x 2)     Status: None   Collection Time: 12/23/16  4:53 AM  Result Value Ref Range Status   Specimen Description BLOOD  Final   Special Requests NONE  Final   Culture NO GROWTH 5 DAYS  Final   Report Status 12/28/2016 FINAL  Final    Coagulation Studies: No results for input(s): LABPROT, INR in the last 72 hours.  Urinalysis: Recent Labs    08/19/17 1633  COLORURINE YELLOW*  LABSPEC 1.011  PHURINE 6.0  GLUCOSEU NEGATIVE  HGBUR NEGATIVE  BILIRUBINUR NEGATIVE  KETONESUR NEGATIVE  PROTEINUR NEGATIVE  NITRITE  NEGATIVE  LEUKOCYTESUR NEGATIVE      Imaging: Dg Chest 2 View  Result Date: 08/21/2017 CLINICAL DATA:  Cough. EXAM: CHEST - 2 VIEW COMPARISON:  08/18/2017 FINDINGS: Sequelae of CABG are again identified with multiple fractured sternal wires. The cardiac silhouette remains enlarged. Pulmonary vascular congestion is stable to mildly increased. There is chronic nodularity in the left lung apex. Streaky opacities in both lung bases are similar to the prior study and suggest atelectasis. No pleural effusion or pneumothorax is identified. IMPRESSION: 1. Cardiomegaly with mildly worsening pulmonary vascular congestion. 2. Low lung volumes with bibasilar atelectasis. Electronically Signed   By: Logan Bores M.D.   On: 08/21/2017 10:53   US Renal  Result Date: 08/19/2017 CLINICAL DATA:  Acute kidney injury, chronic kidney  disease stage 3 EXAM: RENAL / URINARY TRACT ULTRASOUND COMPLETE COMPARISON:  CT 09/20/2015 FINDINGS: Right Kidney: Length: 10.5 cm. 2.3 cm cyst. Normal echotexture. Mild cortical thinning. No hydronephrosis. Left Kidney: Length: 11.7 cm. 1.2 cm lower pole cyst. Mild cortical thinning. Normal echotexture. No hydronephrosis. Rounded soft tissue area noted adjacent to the mid to lower pole of the left kidney. This does not appear to be definitively arising from the left kidney. This may simply reflect prominent perinephric fat. I am not convinced this is a true lesion. Bladder: Appears normal for degree of bladder distention. IMPRESSION: Mild cortical thinning bilaterally.  No hydronephrosis. Rounded soft tissue area adjacent to the mid to lower pole of the left kidney. I do not believe this is arising from the left kidney and may simply represent prominent perinephric fat. CT abdomen with contrast could be performed to completely exclude exophytic renal lesion. Electronically Signed   By: Rolm Baptise M.D.   On: 08/19/2017 18:54     Medications:    . allopurinol  200 mg Oral Daily  . apixaban  2.5 mg Oral BID  . atorvastatin  80 mg Oral QHS  . azithromycin  250 mg Oral Daily  . cefdinir  300 mg Oral Q12H  . clotrimazole   Topical BID  . furosemide  40 mg Intravenous BID  . gabapentin  300 mg Oral q morning - 10a  . gabapentin  600 mg Oral QHS  . ipratropium-albuterol  3 mL Nebulization Q6H WA  . lactulose  30 g Oral BID  . mouth rinse  15 mL Mouth Rinse BID  . metoprolol tartrate  50 mg Oral BID  . sodium chloride flush  3 mL Intravenous Q12H  . spironolactone  25 mg Oral QODAY  . umeclidinium-vilanterol  1 puff Inhalation Daily   azelastine, Melatonin, ondansetron (ZOFRAN) IV, oxyCODONE-acetaminophen, polyethylene glycol  Assessment/ Plan:  Mr. CASTIN DONAGHUE is a 84 y.o. white male with restrictive lung disease on O2, congestive heart failure, atrial fibrillation, coronary artery disease,  hypertension, adrenal mass, hyperlipidemia, peripheral vascular disease, who was admitted to [facility] on 08/18/2017 for acute exacerbation of diastolic congestive heart failure  1. Acute renal failure with hyperkalemia on chronic kidney disease stage III: urine bland. Ultrasound without obstruction.  Baseline creatinine of 1.14, GFR of 55 on 06/10/17.  Acute renal failure secondary to acute cardiorenal syndrome  2. Hypertension with acute exacerbation of diastolic dysfunction and atrial fibrillation Home regimen of torsemide 20mg  bid, spirinolactone 25mg  every other day, and metoprolol - Furosemide IV - changed from PO torsemide.  - Continue spironolactone at current dose, if potassium rises, consider lower dose of spironolactone - Fluid restriction - Low salt diet   LOS: 3  Daylee Delahoz 7/20/201911:30 AM

## 2017-08-21 NOTE — Progress Notes (Signed)
Notified MD of pt's hr dropping into the 40's, as low as 39 but not sustaining. Orders placed. Will continue to monitor and assess.

## 2017-08-21 NOTE — Progress Notes (Signed)
Patient ID: Norman Morales, male   DOB: Nov 25, 1926, 84 y.o.   MRN: [id number]  ACP note  Patient wife and daughter at the bedside  Diagnosis: Acute on chronic hypoxic respiratory failure, acute on chronic diastolic congestive heart failure and anasarca, COPD exacerbation, right lower lobe pneumonia, acute kidney injury on chronic kidney disease stage III, relative hyperkalemia, atrial fibrillation, essential hypertension, constipation.  Plan.  Before sending the patient home I must be comfortable that he is going to be successful at home.  I do not want to send him home until I know he is going to be successful at home.  I will listen to the lungs again tomorrow and see how they sound.  Took a while to talk with family about all of the above issues.  20 minutes of ACP discussion.  Dr. Loletha Grayer

## 2017-08-22 ENCOUNTER — Inpatient Hospital Stay
Admission: EM | Admit: 2017-08-22 | Discharge: 2017-09-01 | DRG: 291 | Disposition: A | Discharge status: Hospice | Payer: Medicare Other | Attending: Internal Medicine | Admitting: Internal Medicine

## 2017-08-22 ENCOUNTER — Emergency Department: Payer: Medicare Other

## 2017-08-22 ENCOUNTER — Other Ambulatory Visit: Payer: Self-pay

## 2017-08-22 DIAGNOSIS — E875 Hyperkalemia: Secondary | ICD-10-CM | POA: Diagnosis present

## 2017-08-22 DIAGNOSIS — J9621 Acute and chronic respiratory failure with hypoxia: Secondary | ICD-10-CM | POA: Diagnosis present

## 2017-08-22 DIAGNOSIS — G4700 Insomnia, unspecified: Secondary | ICD-10-CM | POA: Diagnosis present

## 2017-08-22 DIAGNOSIS — G4733 Obstructive sleep apnea (adult) (pediatric): Secondary | ICD-10-CM | POA: Diagnosis present

## 2017-08-22 DIAGNOSIS — J441 Chronic obstructive pulmonary disease with (acute) exacerbation: Secondary | ICD-10-CM | POA: Diagnosis present

## 2017-08-22 DIAGNOSIS — I130 Hypertensive heart and chronic kidney disease with heart failure and stage 1 through stage 4 chronic kidney disease, or unspecified chronic kidney disease: Principal | ICD-10-CM | POA: Diagnosis present

## 2017-08-22 DIAGNOSIS — Z6838 Body mass index (BMI) 38.0-38.9, adult: Secondary | ICD-10-CM

## 2017-08-22 DIAGNOSIS — I482 Chronic atrial fibrillation: Secondary | ICD-10-CM | POA: Diagnosis present

## 2017-08-22 DIAGNOSIS — E785 Hyperlipidemia, unspecified: Secondary | ICD-10-CM | POA: Diagnosis present

## 2017-08-22 DIAGNOSIS — I509 Heart failure, unspecified: Secondary | ICD-10-CM

## 2017-08-22 DIAGNOSIS — Z79890 Hormone replacement therapy (postmenopausal): Secondary | ICD-10-CM

## 2017-08-22 DIAGNOSIS — R0602 Shortness of breath: Secondary | ICD-10-CM

## 2017-08-22 DIAGNOSIS — Z96653 Presence of artificial knee joint, bilateral: Secondary | ICD-10-CM | POA: Diagnosis present

## 2017-08-22 DIAGNOSIS — N183 Chronic kidney disease, stage 3 (moderate): Secondary | ICD-10-CM | POA: Diagnosis present

## 2017-08-22 DIAGNOSIS — I248 Other forms of acute ischemic heart disease: Secondary | ICD-10-CM | POA: Diagnosis present

## 2017-08-22 DIAGNOSIS — R042 Hemoptysis: Secondary | ICD-10-CM | POA: Diagnosis not present

## 2017-08-22 DIAGNOSIS — Z9841 Cataract extraction status, right eye: Secondary | ICD-10-CM

## 2017-08-22 DIAGNOSIS — Z981 Arthrodesis status: Secondary | ICD-10-CM

## 2017-08-22 DIAGNOSIS — Z66 Do not resuscitate: Secondary | ICD-10-CM | POA: Diagnosis present

## 2017-08-22 DIAGNOSIS — I878 Other specified disorders of veins: Secondary | ICD-10-CM | POA: Diagnosis present

## 2017-08-22 DIAGNOSIS — Z888 Allergy status to other drugs, medicaments and biological substances status: Secondary | ICD-10-CM

## 2017-08-22 DIAGNOSIS — Z951 Presence of aortocoronary bypass graft: Secondary | ICD-10-CM

## 2017-08-22 DIAGNOSIS — Z9981 Dependence on supplemental oxygen: Secondary | ICD-10-CM

## 2017-08-22 DIAGNOSIS — R911 Solitary pulmonary nodule: Secondary | ICD-10-CM | POA: Diagnosis present

## 2017-08-22 DIAGNOSIS — E278 Other specified disorders of adrenal gland: Secondary | ICD-10-CM | POA: Diagnosis present

## 2017-08-22 DIAGNOSIS — Z87891 Personal history of nicotine dependence: Secondary | ICD-10-CM

## 2017-08-22 DIAGNOSIS — Z7901 Long term (current) use of anticoagulants: Secondary | ICD-10-CM

## 2017-08-22 DIAGNOSIS — E669 Obesity, unspecified: Secondary | ICD-10-CM | POA: Diagnosis present

## 2017-08-22 DIAGNOSIS — R339 Retention of urine, unspecified: Secondary | ICD-10-CM | POA: Diagnosis present

## 2017-08-22 DIAGNOSIS — Z9842 Cataract extraction status, left eye: Secondary | ICD-10-CM

## 2017-08-22 DIAGNOSIS — J181 Lobar pneumonia, unspecified organism: Secondary | ICD-10-CM | POA: Diagnosis present

## 2017-08-22 DIAGNOSIS — I739 Peripheral vascular disease, unspecified: Secondary | ICD-10-CM | POA: Diagnosis present

## 2017-08-22 DIAGNOSIS — J440 Chronic obstructive pulmonary disease with acute lower respiratory infection: Secondary | ICD-10-CM | POA: Diagnosis present

## 2017-08-22 DIAGNOSIS — Y95 Nosocomial condition: Secondary | ICD-10-CM | POA: Diagnosis present

## 2017-08-22 DIAGNOSIS — I5033 Acute on chronic diastolic (congestive) heart failure: Secondary | ICD-10-CM | POA: Diagnosis present

## 2017-08-22 DIAGNOSIS — I2510 Atherosclerotic heart disease of native coronary artery without angina pectoris: Secondary | ICD-10-CM | POA: Diagnosis present

## 2017-08-22 DIAGNOSIS — Z79899 Other long term (current) drug therapy: Secondary | ICD-10-CM

## 2017-08-22 LAB — CBC
HEMATOCRIT: 44.4 % (ref 40.0–52.0)
Hemoglobin: 14.3 g/dL (ref 13.0–18.0)
MCH: 31 pg (ref 26.0–34.0)
MCHC: 32.3 g/dL (ref 32.0–36.0)
MCV: 96.1 fL (ref 80.0–100.0)
PLATELETS: 168 10*3/uL (ref 150–440)
RBC: 4.61 MIL/uL (ref 4.40–5.90)
RDW: 19.3 % — ABNORMAL HIGH (ref 11.5–14.5)
WBC: 14.5 10*3/uL — ABNORMAL HIGH (ref 3.8–10.6)

## 2017-08-22 LAB — RENAL FUNCTION PANEL
ALBUMIN: 3.5 g/dL (ref 3.5–5.0)
ANION GAP: 8 (ref 5–15)
BUN: 64 mg/dL — ABNORMAL HIGH (ref 8–23)
CALCIUM: 8.7 mg/dL — AB (ref 8.9–10.3)
CO2: 33 mmol/L — AB (ref 22–32)
CREATININE: 1.66 mg/dL — AB (ref 0.61–1.24)
Chloride: 93 mmol/L — ABNORMAL LOW (ref 98–111)
GFR calc non Af Amer: 35 mL/min — ABNORMAL LOW (ref >60)
GFR, EST AFRICAN AMERICAN: 41 mL/min — AB (ref >60)
GLUCOSE: 130 mg/dL — AB (ref 70–99)
PHOSPHORUS: 5.4 mg/dL — AB (ref 2.5–4.6)
Potassium: 4.8 mmol/L (ref 3.5–5.1)
SODIUM: 134 mmol/L — AB (ref 135–145)

## 2017-08-22 LAB — BASIC METABOLIC PANEL
Anion gap: 10 (ref 5–15)
BUN: 61 mg/dL — AB (ref 8–23)
CALCIUM: 8.7 mg/dL — AB (ref 8.9–10.3)
CO2: 33 mmol/L — AB (ref 22–32)
Chloride: 93 mmol/L — ABNORMAL LOW (ref 98–111)
Creatinine, Ser: 1.42 mg/dL — ABNORMAL HIGH (ref 0.61–1.24)
GFR calc Af Amer: 49 mL/min — ABNORMAL LOW (ref >60)
GFR, EST NON AFRICAN AMERICAN: 42 mL/min — AB (ref >60)
GLUCOSE: 123 mg/dL — AB (ref 70–99)
Potassium: 4.6 mmol/L (ref 3.5–5.1)
Sodium: 136 mmol/L (ref 135–145)

## 2017-08-22 LAB — TROPONIN I: Troponin I: 0.05 ng/mL (ref <0.03)

## 2017-08-22 MED ORDER — POLYETHYLENE GLYCOL 3350 17 G PO PACK: 17.0000 g | PACK | Freq: Every day | ORAL | 0 refills | Status: AC | PRN

## 2017-08-22 MED ORDER — APIXABAN 2.5 MG PO TABS: 2.5000 mg | ORAL_TABLET | Freq: Two times a day (BID) | ORAL | 0 refills | Status: DC

## 2017-08-22 MED ORDER — METHYLPREDNISOLONE SODIUM SUCC 125 MG IJ SOLR
125.0000 mg | Freq: Once | INTRAMUSCULAR | Status: AC
  Administered 2017-08-22: 125 mg via INTRAVENOUS
  Filled 2017-08-22: qty 2

## 2017-08-22 MED ORDER — ALBUTEROL SULFATE HFA 108 (90 BASE) MCG/ACT IN AERS: 2.0000 | INHALATION_SPRAY | RESPIRATORY_TRACT | 0 refills | Status: AC | PRN

## 2017-08-22 MED ORDER — CEFDINIR 300 MG PO CAPS: 300.0000 mg | ORAL_CAPSULE | Freq: Two times a day (BID) | ORAL | 0 refills | Status: DC

## 2017-08-22 MED ORDER — ALBUTEROL SULFATE (2.5 MG/3ML) 0.083% IN NEBU
INHALATION_SOLUTION | RESPIRATORY_TRACT | Status: AC
  Administered 2017-08-22: 22:00:00
  Filled 2017-08-22: qty 3

## 2017-08-22 MED ORDER — FUROSEMIDE 10 MG/ML IJ SOLN
80.0000 mg | Freq: Once | INTRAMUSCULAR | Status: AC
  Administered 2017-08-22: 80 mg via INTRAVENOUS
  Filled 2017-08-22: qty 8

## 2017-08-22 MED ORDER — IPRATROPIUM-ALBUTEROL 0.5-2.5 (3) MG/3ML IN SOLN
3.0000 mL | Freq: Once | RESPIRATORY_TRACT | Status: AC
  Administered 2017-08-22: 3 mL via RESPIRATORY_TRACT
  Filled 2017-08-22: qty 3

## 2017-08-22 MED ORDER — IPRATROPIUM-ALBUTEROL 0.5-2.5 (3) MG/3ML IN SOLN
RESPIRATORY_TRACT | Status: AC
  Filled 2017-08-22: qty 3

## 2017-08-22 NOTE — Discharge Instructions (Signed)
Wear oxygen all the time.

## 2017-08-22 NOTE — Discharge Summary (Signed)
[place] [facility] NAME: Norman Morales    MR#:  [id number]  DATE OF BIRTH:  02/19/1926  DATE OF ADMISSION:  08/18/2017 ADMITTING PHYSICIAN: Gladstone Lighter, MD  DATE OF DISCHARGE: 08/22/2017  PRIMARY CARE PHYSICIAN: Adin Hector, MD    ADMISSION DIAGNOSIS:  Dyspnea on exertion [R06.09] Hypoxia [R09.02]  DISCHARGE DIAGNOSIS:  Active Problems:   CHF exacerbation ([place])   SECONDARY DIAGNOSIS:   Past Medical History:  Diagnosis Date  . A-fib ([place])   . Adrenal adenoma   . CHF (congestive heart failure) ([place])   . Coronary artery disease   . DDD (degenerative disc disease), cervical   . Hyperlipidemia   . Hypertension   . Lung nodule   . Restrictive lung disease     HOSPITAL COURSE:   1.  Acute on chronic hypoxic respiratory failure.  Patient advised that he needs to wear his oxygen 24/7.  I checked the pulse ox on room air and it dropped as low as 78%.  I put him back on 2 L.  Likely will need 3 L with walking around. 2.  Acute on chronic diastolic congestive heart failure, lower extremity anasarca.  Patient was diuresed with IV Lasix and can go back on oral torsemide upon going home.  Patient on metoprolol.  Patient advised to wear CPAP at night to lessen his risk for heart failure. 3.  COPD exacerbation with restrictive lung disease.  Patient on nebulizer treatments while here in the hospital.  Patient finished steroid treatment here in the hospital.  Patient will go back on his inhalers as outpatient.  I prescribed an albuterol inhaler. 4.  Right lower lobe pneumonia seen on initial chest x-ray.  Finished antibiotics Zithromax here in the hospital.  His Rocephin was switched over to [facility] for couple more days 5.  Acute kidney injury on chronic kidney disease stage III.  Creatinine stayed between 1.37 and 1.66 during the hospital course.  Need to check a BMP as outpatient and if potassium continues to rise may have to get rid of  Spironolactone. 6.  Relative hypokalemia.  This has improved with Lasix in the hospital. 7.  Atrial fibrillation on Eliquis for anticoagulation.  Dose decreased to 2.5 mg twice daily secondary to kidney function and age.  Patient on metoprolol. 8.  Essential hypertension on metoprolol and asked Dr. Marzetta Merino 9.  Constipation relieved during the hospital course with lactulose.  Prescribe MiraLAX upon going home. 10.  Home health nurse for [employer]   DISCHARGE CONDITIONS:   Fair  CONSULTS OBTAINED:  Treatment Team:  Corey Skains, MD Lavonia Dana, MD  DRUG ALLERGIES:   Allergies  Allergen Reactions  . Ambien [Zolpidem Tartrate] Other (See Comments)    Nightmares/ yelling out     DISCHARGE MEDICATIONS:   Allergies as of 08/22/2017      Reactions   Ambien [zolpidem Tartrate] Other (See Comments)   Nightmares/ yelling out      Medication List    STOP taking these medications   zolpidem 5 MG tablet Commonly known as:  AMBIEN     TAKE these medications   albuterol 108 (90 Base) MCG/ACT inhaler Commonly known as:  PROVENTIL HFA;VENTOLIN HFA Inhale 2 puffs into the lungs every 4 (four) hours as needed for wheezing or shortness of breath.   allopurinol 100 MG tablet Commonly known as:  ZYLOPRIM Take 1 tablet (100 mg total) by mouth daily. What changed:  how much  to take   ammonium lactate 12 % cream Commonly known as:  AMLACTIN Apply topically 2 (two) times daily as needed for dry skin.   ANORO ELLIPTA 62.5-25 MCG/INH Aepb Generic drug:  umeclidinium-vilanterol Inhale 1 puff into the lungs daily.   apixaban 2.5 MG Tabs tablet Commonly known as:  ELIQUIS Take 1 tablet (2.5 mg total) by mouth 2 (two) times daily. What changed:    medication strength  how much to take   atorvastatin 80 MG tablet Commonly known as:  LIPITOR TAKE ONE TABLET AT BEDTIME   azelastine 0.1 % nasal spray Commonly known as:  ASTELIN Place 1-2 sprays into the nose 2 (two) times  daily as needed for rhinitis.   cefdinir 300 MG capsule Commonly known as:  OMNICEF Take 1 capsule (300 mg total) by mouth every 12 (twelve) hours.   CoQ-10 200 MG Caps Take 200 mg by mouth every evening.   gabapentin 300 MG capsule Commonly known as:  NEURONTIN Take 1 capsule (300MG ) by mouth every morning and 2 capsules (600MG ) by mouth every evening   Melatonin 10 MG Tabs Take 20 mg by mouth daily as needed (sleep).   metoprolol tartrate 50 MG tablet Commonly known as:  LOPRESSOR Take 50 mg by mouth 2 (two) times daily.   polyethylene glycol packet Commonly known as:  MIRALAX / GLYCOLAX Take 17 g by mouth daily as needed for mild constipation. What changed:    when to take this  reasons to take this   spironolactone 25 MG tablet Commonly known as:  ALDACTONE Take 25 mg by mouth every other day.   torsemide 20 MG tablet Commonly known as:  DEMADEX Take 2 tablets (40 mg total) by mouth 2 (two) times daily.        DISCHARGE INSTRUCTIONS:   Follow-up PMD 6 days Follow-up cardiology 1 week Follow-up heart failure clinic  If you experience worsening of your admission symptoms, develop shortness of breath, life threatening emergency, suicidal or homicidal thoughts you must seek medical attention immediately by calling 911 or calling your MD immediately  if symptoms less severe.  You Must read complete instructions/literature along with all the possible adverse reactions/side effects for all the Medicines you take and that have been prescribed to you. Take any new Medicines after you have completely understood and accept all the possible adverse reactions/side effects.   Please note  You were cared for by a hospitalist during your hospital stay. If you have any questions about your discharge medications or the care you received while you were in the hospital after you are discharged, you can call the unit and asked to speak with the hospitalist on call if the  hospitalist that took care of you is not available. Once you are discharged, your primary care physician will handle any further medical issues. Please note that NO REFILLS for any discharge medications will be authorized once you are discharged, as it is imperative that you return to your primary care physician (or establish a relationship with a primary care physician if you do not have one) for your aftercare needs so that they can reassess your need for medications and monitor your lab values.    Today   CHIEF COMPLAINT:   Chief Complaint  Patient presents with  . Shortness of Breath    HISTORY OF PRESENT ILLNESS:  Norman Morales  is a 84 y.o. male presented with shortness of breath found to have pneumonia tests, COPD exacerbation and CHF exacerbation  VITAL SIGNS:  Blood pressure (!) 152/86, pulse 65, temperature (!) 97.5 F (36.4 C), temperature source Oral, resp. rate (!) 24, height 6' (1.829 m), weight 125.3 kg (276 lb 4.8 oz), SpO2 98 %.    PHYSICAL EXAMINATION:  GENERAL:  84 y.o.-year-old patient lying in the bed with no acute distress.  EYES: Pupils equal, round, reactive to light and accommodation. No scleral icterus. Extraocular muscles intact.  HEENT: Head atraumatic, normocephalic. Oropharynx and nasopharynx clear.  NECK:  Supple, no jugular venous distention. No thyroid enlargement, no tenderness.  LUNGS:  decreased breath sounds bilaterally, no wheezing, rales,rhonchi or crepitation. No use of accessory muscles of respiration.  CARDIOVASCULAR: S1, S2 normal. No murmurs, rubs, or gallops.  ABDOMEN: Soft, non-tender, non-distended. Bowel sounds present. No organomegaly or mass.  EXTREMITIES: 3+ pedal edema.  No cyanosis, or clubbing.  NEUROLOGIC: Cranial nerves II through XII are intact. Muscle strength 5/5 in all extremities. Sensation intact. Gait not checked.  PSYCHIATRIC: The patient is alert and oriented x 3.  SKIN: No obvious rash, lesion, or ulcer.   DATA  REVIEW:   CBC Recent Labs  Lab 08/21/17 0822  WBC 11.0*  HGB 12.0*  HCT 37.6*  PLT 176    Chemistries  Recent Labs  Lab 08/21/17 0822 08/22/17 0648  NA 135 134*  K 5.2* 4.8  CL 91* 93*  CO2 36* 33*  GLUCOSE 142* 130*  BUN 59* 64*  CREATININE 1.61* 1.66*  CALCIUM 8.9 8.7*  MG 2.4  --     Cardiac Enzymes Recent Labs  Lab 08/18/17 1206  TROPONINI <0.03    Microbiology Results    RADIOLOGY:  Dg Chest 2 View  Result Date: 08/21/2017 CLINICAL DATA:  Cough. EXAM: CHEST - 2 VIEW COMPARISON:  08/18/2017 FINDINGS: Sequelae of CABG are again identified with multiple fractured sternal wires. The cardiac silhouette remains enlarged. Pulmonary vascular congestion is stable to mildly increased. There is chronic nodularity in the left lung apex. Streaky opacities in both lung bases are similar to the prior study and suggest atelectasis. No pleural effusion or pneumothorax is identified. IMPRESSION: 1. Cardiomegaly with mildly worsening pulmonary vascular congestion. 2. Low lung volumes with bibasilar atelectasis. Electronically Signed   By: Logan Bores M.D.   On: 08/21/2017 10:53      Management plans discussed with the patient, family and they are in agreement.  CODE STATUS:     Code Status Orders  (From admission, onward)        Start     Ordered   08/18/17 1829  Full code  Continuous     08/18/17 1828    Code Status History    Date Active Date Inactive Code Status Order ID Comments User Context   06/04/2017 1414 06/10/2017 1744 DNR [id number]  Hillary Bow, MD Inpatient   06/04/2017 1244 06/04/2017 1414 Full Code [id number]  Hillary Bow, MD Inpatient   12/23/2016 0806 12/27/2016 1617 Full Code [id number]  Lance Coon, MD Inpatient   12/23/2016 0714 12/23/2016 0806 Full Code [id number]  Hillary Bow, MD ED   12/30/2015 1429 12/30/2015 1859 Full Code [id number]  Dew, Erskine Squibb, MD Inpatient    Advance Directive Documentation     Most Recent Value  Type of Advance  Directive  Living will  Pre-existing out of facility DNR order (yellow form or pink MOST form)  -  "MOST" Form in Place?  -      TOTAL TIME TAKING CARE OF THIS PATIENT: 38 minutes.    Redding Cloe  Mithra Spano M.D on 08/22/2017 at 12:22 PM  Between 7am to 6pm - Pager - [phone]  After 6pm go to [email or web address] - password [email or web address] [facility]  [phone]  CC: Primary care physician; Adin Hector, MD

## 2017-08-22 NOTE — Progress Notes (Signed)
Pt is discharging home. IV & heart monitor removed. Pt has personal O2 tank and is set to 2L at rest, 3 L when ambulating. Reviewed discharge instructions and education with pt and his wife. Pt escorted to [place] via wheelchair by volunteer.

## 2017-08-22 NOTE — Progress Notes (Signed)
[facility]  ROUNDING NOTE   Subjective:   Seated in chair.  Restarted on IV diuretics yesterday. Breathing room air this morning.   Objective:  Vital signs in last 24 hours:  Temp:  [97.5 F (36.4 C)-97.8 F (36.6 C)] 97.5 F (36.4 C) (07/21 0726) Pulse Rate:  [65-98] 65 (07/21 0726) Resp:  [19-24] 24 (07/21 0334) BP: (125-152)/(60-86) 152/86 (07/21 0726) SpO2:  [90 %-98 %] 98 % (07/21 0726) Weight:  [125.3 kg (276 lb 4.8 oz)] 125.3 kg (276 lb 4.8 oz) (07/21 0334)  Weight change: -0.454 kg (-1 lb) Filed Weights   08/20/17 0347 08/21/17 0451 08/22/17 0334  Weight: 124.9 kg (275 lb 4.8 oz) 125.8 kg (277 lb 4.8 oz) 125.3 kg (276 lb 4.8 oz)    Intake/Output: I/O last 3 completed shifts: In: -  Out: 1800 [Urine:1800]   Intake/Output this shift:  No intake/output data recorded.  Physical Exam: General: NAD, sitting up    Head: Normocephalic, atraumatic. Moist oral mucosal membranes  Eyes: Anicteric, PERRL  Neck: Supple, trachea midline  Lungs:  Clear to auscultation  Heart: Regular rate and rhythm  Abdomen:  Soft, nontender,   Extremities: + peripheral edema.  Left weeping edema, right in UNNA boot  Neurologic: Nonfocal, moving all four extremities  Skin: No lesions        Basic Metabolic Panel: Recent Labs  Lab 08/18/17 1206 08/19/17 1339 08/20/17 0527 08/21/17 0822 08/22/17 0648  NA 135 137 138 135 134*  K 3.8 4.4 5.3* 5.2* 4.8  CL 92* 94* 97* 91* 93*  CO2 33* 35* 35* 36* 33*  GLUCOSE 130* 146* 137* 142* 130*  BUN 55* 48* 54* 59* 64*  CREATININE 1.58* 1.37* 1.50* 1.61* 1.66*  CALCIUM 8.9 9.0 8.8* 8.9 8.7*  MG  --   --   --  2.4  --   PHOS  --   --  4.7*  --  5.4*    Liver Function Tests: Recent Labs  Lab 08/20/17 0527 08/22/17 0648  ALBUMIN 3.3* 3.5   No results for input(s): LIPASE, AMYLASE in the last 168 hours. No results for input(s): AMMONIA in the last 168 hours.  CBC: Recent Labs  Lab 08/18/17 1206 08/19/17 1339  08/20/17 0527 08/21/17 0822  WBC 9.2 10.1 14.2* 11.0*  HGB 12.0* 12.1* 11.7* 12.0*  HCT 35.8* 37.0* 35.7* 37.6*  MCV 94.0 95.7 95.3 96.1  PLT 164 170 172 176    Cardiac Enzymes: Recent Labs  Lab 08/18/17 1206  TROPONINI <0.03    BNP: Invalid input(s): POCBNP  CBG: No results for input(s): GLUCAP in the last 168 hours.  Microbiology: Results for orders placed or performed during the hospital encounter of 12/23/16  Blood Culture (routine x 2)     Status: None   Collection Time: 12/23/16  4:53 AM  Result Value Ref Range Status   Specimen Description BLOOD  Final   Special Requests NONE  Final   Culture NO GROWTH 5 DAYS  Final   Report Status 12/28/2016 FINAL  Final  Blood Culture (routine x 2)     Status: None   Collection Time: 12/23/16  4:53 AM  Result Value Ref Range Status   Specimen Description BLOOD  Final   Special Requests NONE  Final   Culture NO GROWTH 5 DAYS  Final   Report Status 12/28/2016 FINAL  Final    Coagulation Studies: No results for input(s): LABPROT, INR in the last 72 hours.  Urinalysis: Recent Labs  08/19/17 1633  Norman Morales 1.011  PHURINE 6.0  GLUCOSEU NEGATIVE  HGBUR NEGATIVE  BILIRUBINUR NEGATIVE  KETONESUR NEGATIVE  PROTEINUR NEGATIVE  NITRITE NEGATIVE  LEUKOCYTESUR NEGATIVE      Imaging: Dg Chest 2 View  Result Date: 08/21/2017 CLINICAL DATA:  Cough. EXAM: CHEST - 2 VIEW COMPARISON:  08/18/2017 FINDINGS: Sequelae of CABG are again identified with multiple fractured sternal wires. The cardiac silhouette remains enlarged. Pulmonary vascular congestion is stable to mildly increased. There is chronic nodularity in the left lung apex. Streaky opacities in both lung bases are similar to the prior study and suggest atelectasis. No pleural effusion or pneumothorax is identified. IMPRESSION: 1. Cardiomegaly with mildly worsening pulmonary vascular congestion. 2. Low lung volumes with bibasilar atelectasis.  Electronically Signed   By: Norman Morales M.D.   On: 08/21/2017 10:53     Medications:    . allopurinol  200 mg Oral Daily  . apixaban  2.5 mg Oral BID  . atorvastatin  80 mg Oral QHS  . azithromycin  250 mg Oral Daily  . cefdinir  300 mg Oral Q12H  . clotrimazole   Topical BID  . furosemide  40 mg Intravenous BID  . gabapentin  300 mg Oral q morning - 10a  . gabapentin  600 mg Oral QHS  . ipratropium-albuterol  3 mL Nebulization Q6H WA  . lactulose  30 g Oral BID  . mouth rinse  15 mL Mouth Rinse BID  . metoprolol tartrate  50 mg Oral BID  . sodium chloride flush  3 mL Intravenous Q12H  . spironolactone  25 mg Oral QODAY  . umeclidinium-vilanterol  1 puff Inhalation Daily   azelastine, guaiFENesin-codeine, Melatonin, ondansetron (ZOFRAN) IV, oxyCODONE-acetaminophen, polyethylene glycol, sodium phosphate  Assessment/ Plan:  [name] is a 84 y.o. white male with restrictive lung disease on O2, congestive heart failure, atrial fibrillation, coronary artery disease, hypertension, adrenal mass, hyperlipidemia, peripheral vascular disease, who was admitted to [facility] on 08/18/2017 for acute exacerbation of diastolic congestive heart failure  1. Acute renal failure with hyperkalemia on chronic kidney disease stage III: urine bland. Ultrasound without obstruction.  Baseline creatinine of 1.14, GFR of 55 on 06/10/17.  Acute renal failure secondary to acute cardiorenal syndrome  2. Hypertension with acute exacerbation of diastolic dysfunction and atrial fibrillation Home regimen of torsemide 20mg  bid, spirinolactone 25mg  every other day, and metoprolol  Plan  - Furosemide IV  - Continue spironolactone at current dose, if potassium rises, consider lower dose of spironolactone - Fluid restriction - Low salt diet - Daily weights - Will require outpatient follow up with nephrology.    LOS: 4 Norman Morales 7/21/20199:09 AM

## 2017-08-22 NOTE — ED Triage Notes (Signed)
Pt arrives from home via [facility] after becoming SOB at home and family/EMS report home O2 sat 80% on his CPAP. Pt arrives on 4L bilat wheezing noted. Protocol initiated.

## 2017-08-22 NOTE — ED Provider Notes (Signed)
[facility] Emergency Department Provider Note ____________________________________________   First MD Initiated Contact with Patient 08/22/17 2135     (approximate)  I have reviewed the triage vital signs and the nursing notes.   HISTORY  Chief Complaint Shortness of Breath    HPI Norman DECOCK is a 84 y.o. male with PMH as noted below including CHF and possible COPD who presents with worsening shortness of breath and hypoxia over the course of the day today.  The patient was just admitted to the hospital and released earlier this afternoon.  Per the family, he has been hypoxic to the 80s even on oxygen at home.  The patient denies chest  pain, fever, or vomiting.  Past Medical History:  Diagnosis Date  . A-fib (Grandfather)   . Adrenal adenoma   . CHF (congestive heart failure) ([place])   . Coronary artery disease   . DDD (degenerative disc disease), cervical   . Hyperlipidemia   . Hypertension   . Lung nodule   . Restrictive lung disease     Patient Active Problem List   Diagnosis Date Noted  . CHF exacerbation ([place]) 08/18/2017  . Obstructive sleep apnea 07/12/2017  . Hyperlipidemia 09/04/2016  . PAD (peripheral artery disease) ([place]) 09/04/2016  . Swelling of limb 03/27/2016  . Arrhythmia 12/24/2015  . Essential hypertension 12/24/2015  . Congestive heart failure ([place]) 12/24/2015  . Atherosclerosis of native arteries of the extremities with ulceration ([place]) 12/24/2015  . CAD (coronary artery disease) of artery bypass graft 12/24/2015    Past Surgical History:  Procedure Laterality Date  . CARDIAC SURGERY    . CATARACT EXTRACTION, BILATERAL    . CORONARY ARTERY BYPASS GRAFT    . EXPLORATION POST OPERATIVE OPEN HEART    . LUMBAR FUSION    . PERIPHERAL VASCULAR CATHETERIZATION Left 12/30/2015   Procedure: Lower Extremity Angiography;  Surgeon: Algernon Huxley, MD;  Location: [facility] [facility];  Service: Cardiovascular;  Laterality: Left;  .  PERIPHERAL VASCULAR CATHETERIZATION  12/30/2015   Procedure: Lower Extremity Intervention;  Surgeon: Algernon Huxley, MD;  Location: [facility];  Service: Cardiovascular;;  . REPLACEMENT TOTAL KNEE BILATERAL      Prior to Admission medications   Medication Sig Start Date End Date Taking? Authorizing Provider  albuterol (PROVENTIL HFA;VENTOLIN HFA) 108 (90 Base) MCG/ACT inhaler Inhale 2 puffs into the lungs every 4 (four) hours as needed for wheezing or shortness of breath. 08/22/17  Yes Wieting, Richard, MD  allopurinol (ZYLOPRIM) 100 MG tablet Take 1 tablet (100 mg total) by mouth daily. 06/10/17  Yes Gladstone Lighter, MD  ammonium lactate (AMLACTIN) 12 % cream Apply topically 2 (two) times daily as needed for dry skin.   Yes [provider]  apixaban (ELIQUIS) 2.5 MG TABS tablet Take 1 tablet (2.5 mg total) by mouth 2 (two) times daily. 08/22/17  Yes Wieting, Richard, MD  atorvastatin (LIPITOR) 80 MG tablet TAKE ONE TABLET AT BEDTIME 04/15/15  Yes [provider]  azelastine (ASTELIN) 0.1 % nasal spray Place 1-2 sprays into the nose 2 (two) times daily as needed for rhinitis.    Yes [provider]  cefdinir (OMNICEF) 300 MG capsule Take 1 capsule (300 mg total) by mouth every 12 (twelve) hours. 08/22/17  Yes Wieting, Richard, MD  Coenzyme Q10 (COQ-10) 200 MG CAPS Take 200 mg by mouth every evening.   Yes [provider]  gabapentin (NEURONTIN) 300 MG capsule Take 1 capsule (300MG ) by  mouth every morning and 2 capsules (600MG ) by mouth every evening   Yes [provider]  Melatonin 10 MG TABS Take 20 mg by mouth daily as needed (sleep).    Yes [provider]  metoprolol tartrate (LOPRESSOR) 50 MG tablet Take 50 mg by mouth 2 (two) times daily.    Yes [provider]  polyethylene glycol (MIRALAX / GLYCOLAX) packet Take 17 g by mouth daily as needed for mild constipation. 08/22/17  Yes Wieting, Richard, MD  spironolactone  (ALDACTONE) 25 MG tablet Take 25 mg by mouth every other day.    Yes [provider]  torsemide (DEMADEX) 20 MG tablet Take 2 tablets (40 mg total) by mouth 2 (two) times daily. 06/10/17  Yes Gladstone Lighter, MD  umeclidinium-vilanterol (ANORO ELLIPTA) 62.5-25 MCG/INH AEPB Inhale 1 puff into the lungs daily.  05/16/14  Yes [provider]    Allergies Ambien [zolpidem tartrate]  Family History  Problem Relation Age of Onset  . Aortic aneurysm Mother     Social History Social History   Tobacco Use  . Smoking status: Former Smoker    Packs/day: 1.00    Years: 42.00    Pack years: 42.00    Types: Cigarettes  . Smokeless tobacco: Never Used  . Tobacco comment: quit 42 years ago  Substance Use Topics  . Alcohol use: Not Currently    Comment: occasionally  . Drug use: No    Review of Systems  Constitutional: No fever. Eyes: No redness. ENT: No sore throat. Cardiovascular: Denies chest pain. Respiratory: Positive for shortness of breath. Gastrointestinal: No vomiting. Genitourinary: Negative for dysuria.  Musculoskeletal: Negative for back pain. Skin: Negative for rash. Neurological: Negative for headache.  ____________________________________________   PHYSICAL EXAM:  VITAL SIGNS: ED Triage Vitals  Enc Vitals Group     BP 08/22/17 2106 (!) 159/80     Pulse Rate 08/22/17 2106 (!) 151     Resp 08/22/17 2106 (!) 23     Temp 08/22/17 2102 98.2 F (36.8 C)     Temp src --      SpO2 08/22/17 2106 99 %     Weight 08/22/17 2102 276 lb (125.2 kg)     Height 08/22/17 2102 6' (1.829 m)     Head Circumference --      Peak Flow --      Pain Score 08/22/17 2102 0     Pain Loc --      Pain Edu? --      Excl. in [place]? --     Constitutional: Alert and oriented.  Uncomfortable and weak appearing. Eyes: Conjunctivae are normal.  Head: Atraumatic. Nose: No congestion/rhinnorhea. Mouth/Throat: Mucous membranes are moist. Neck: Normal range of motion.    Cardiovascular: Irregular rhythm. Grossly normal heart sounds.  Good peripheral circulation. Respiratory: Increased respiratory effort.  Bilateral decreased air entry, prolonged expiratory phase, and wheezing. Gastrointestinal: Soft and nontender. No distention.  Genitourinary: No flank tenderness. Musculoskeletal: Extremities warm and well perfused.  Neurologic:  Normal speech and language. No gross focal neurologic deficits are appreciated.  Skin:  Skin is warm and dry. No rash noted. Psychiatric: Mood and affect are normal. Speech and behavior are normal.  ____________________________________________   LABS (all labs ordered are listed, but only abnormal results are displayed)  Labs Reviewed  CBC - Abnormal; Notable for the following components:      Result Value   WBC 14.5 (*)    RDW 19.3 (*)    All  other components within normal limits  BASIC METABOLIC PANEL - Abnormal; Notable for the following components:   Chloride 93 (*)    CO2 33 (*)    Glucose, Bld 123 (*)    BUN 61 (*)    Creatinine, Ser 1.42 (*)    Calcium 8.7 (*)    GFR calc non Af Amer 42 (*)    GFR calc Af Amer 49 (*)    All other components within normal limits  TROPONIN I - Abnormal; Notable for the following components:   Troponin I 0.05 (*)    All other components within normal limits   ____________________________________________  EKG  ED ECG REPORT I, Arta Silence, the attending physician, personally viewed and interpreted this ECG.  Date: 08/22/2017 EKG Time: 2100 Rate: 94 Rhythm: Atrial fibrillation QRS Axis: normal Intervals: Nonspecific IVCD ST/T Wave abnormalities: normal Narrative Interpretation: no evidence of acute ischemia  ____________________________________________  RADIOLOGY  CXR: Cardiomegaly with pulmonary edema  ____________________________________________   PROCEDURES  Procedure(s) performed: No  Procedures  Critical Care performed: Yes  CRITICAL  CARE Performed by: Arta Silence   Total critical care time: 30 minutes  Critical care time was exclusive of separately billable procedures and treating other patients.  Critical care was necessary to treat or prevent imminent or life-threatening deterioration.  Critical care was time spent personally by me on the following activities: development of treatment plan with patient and/or surrogate as well as nursing, discussions with consultants, evaluation of patient's response to treatment, examination of patient, obtaining history from patient or surrogate, ordering and performing treatments and interventions, ordering and review of laboratory studies, ordering and review of radiographic studies, pulse oximetry and re-evaluation of patient's condition.  ____________________________________________   INITIAL IMPRESSION / ASSESSMENT AND PLAN / ED COURSE  Pertinent labs & imaging results that were available during my care of the patient were reviewed by me and considered in my medical decision making (see chart for details).  84 year old male with PMH as noted above presents with worsening shortness of breath and weakness over the course of the day today, with hypoxia noted at home despite being on increased oxygen.  I reviewed the past medical records in epic; the patient was just discharged today with acute on chronic hypoxic respiratory failure, with component of both acute CHF and COPD as well as a right lower lobe pneumonia.  On exam, the patient is uncomfortable and weak appearing.  He has wheezing and decreased air entry.  He is in atrial fibrillation but had a normal rate.  The remainder of the exam is as described above.  Overall I suspect most likely recurrent symptoms related either to the CHF or COPD or both.  At this time, the patient is stable with O2 saturation in the low to mid 90s on nasal cannula.  Since on exam he is predominantly wheezing I will give steroid and  nebulizer, and we will give Lasix if findings of edema on chest x-ray.  Plan for likely readmission due to the worsening hypoxia.  ----------------------------------------- 10:59 PM on 08/22/2017 -----------------------------------------  Chest x-ray does show significant edema so I ordered Lasix.  The patient still has significant wheezing and when he falls asleep his O2 saturation drops to about 90%.  After discussion with the patient and family, we will place patient on BiPAP for 1 to 2 hours to hopefully improve his oxygenation and work of breathing.  However, the patient remains alert and oriented x4 when he is awoken.  I signed  the patient out to the hospitalist Dr. Duane Boston for admission. ____________________________________________   FINAL CLINICAL IMPRESSION(S) / ED DIAGNOSES  Final diagnoses:  Acute on chronic respiratory failure with hypoxia (HCC)      NEW MEDICATIONS STARTED DURING THIS VISIT:  New Prescriptions   No medications on file     Note:  This document was prepared using Dragon voice recognition software and may include unintentional dictation errors.   Arta Silence, MD 08/22/17 [phone]

## 2017-08-23 DIAGNOSIS — R042 Hemoptysis: Secondary | ICD-10-CM | POA: Diagnosis not present

## 2017-08-23 DIAGNOSIS — R0602 Shortness of breath: Secondary | ICD-10-CM | POA: Diagnosis present

## 2017-08-23 DIAGNOSIS — I509 Heart failure, unspecified: Secondary | ICD-10-CM

## 2017-08-23 DIAGNOSIS — R339 Retention of urine, unspecified: Secondary | ICD-10-CM | POA: Diagnosis present

## 2017-08-23 DIAGNOSIS — J441 Chronic obstructive pulmonary disease with (acute) exacerbation: Secondary | ICD-10-CM | POA: Diagnosis present

## 2017-08-23 DIAGNOSIS — Z6838 Body mass index (BMI) 38.0-38.9, adult: Secondary | ICD-10-CM | POA: Diagnosis not present

## 2017-08-23 DIAGNOSIS — J9611 Chronic respiratory failure with hypoxia: Secondary | ICD-10-CM | POA: Diagnosis not present

## 2017-08-23 DIAGNOSIS — R918 Other nonspecific abnormal finding of lung field: Secondary | ICD-10-CM | POA: Diagnosis not present

## 2017-08-23 DIAGNOSIS — E785 Hyperlipidemia, unspecified: Secondary | ICD-10-CM | POA: Diagnosis present

## 2017-08-23 DIAGNOSIS — I5033 Acute on chronic diastolic (congestive) heart failure: Secondary | ICD-10-CM | POA: Diagnosis present

## 2017-08-23 DIAGNOSIS — J189 Pneumonia, unspecified organism: Secondary | ICD-10-CM | POA: Diagnosis not present

## 2017-08-23 DIAGNOSIS — I878 Other specified disorders of veins: Secondary | ICD-10-CM | POA: Diagnosis present

## 2017-08-23 DIAGNOSIS — E669 Obesity, unspecified: Secondary | ICD-10-CM | POA: Diagnosis present

## 2017-08-23 DIAGNOSIS — J440 Chronic obstructive pulmonary disease with acute lower respiratory infection: Secondary | ICD-10-CM | POA: Diagnosis present

## 2017-08-23 DIAGNOSIS — I130 Hypertensive heart and chronic kidney disease with heart failure and stage 1 through stage 4 chronic kidney disease, or unspecified chronic kidney disease: Secondary | ICD-10-CM | POA: Diagnosis present

## 2017-08-23 DIAGNOSIS — I739 Peripheral vascular disease, unspecified: Secondary | ICD-10-CM | POA: Diagnosis present

## 2017-08-23 DIAGNOSIS — I482 Chronic atrial fibrillation: Secondary | ICD-10-CM | POA: Diagnosis present

## 2017-08-23 DIAGNOSIS — J9621 Acute and chronic respiratory failure with hypoxia: Secondary | ICD-10-CM | POA: Diagnosis present

## 2017-08-23 DIAGNOSIS — R911 Solitary pulmonary nodule: Secondary | ICD-10-CM | POA: Diagnosis present

## 2017-08-23 DIAGNOSIS — E278 Other specified disorders of adrenal gland: Secondary | ICD-10-CM | POA: Diagnosis present

## 2017-08-23 DIAGNOSIS — J181 Lobar pneumonia, unspecified organism: Secondary | ICD-10-CM | POA: Diagnosis present

## 2017-08-23 DIAGNOSIS — Z66 Do not resuscitate: Secondary | ICD-10-CM | POA: Diagnosis present

## 2017-08-23 DIAGNOSIS — Y95 Nosocomial condition: Secondary | ICD-10-CM | POA: Diagnosis present

## 2017-08-23 DIAGNOSIS — C7972 Secondary malignant neoplasm of left adrenal gland: Secondary | ICD-10-CM | POA: Diagnosis not present

## 2017-08-23 DIAGNOSIS — G4733 Obstructive sleep apnea (adult) (pediatric): Secondary | ICD-10-CM | POA: Diagnosis present

## 2017-08-23 DIAGNOSIS — Z7901 Long term (current) use of anticoagulants: Secondary | ICD-10-CM | POA: Diagnosis not present

## 2017-08-23 DIAGNOSIS — N183 Chronic kidney disease, stage 3 (moderate): Secondary | ICD-10-CM | POA: Diagnosis present

## 2017-08-23 DIAGNOSIS — G4700 Insomnia, unspecified: Secondary | ICD-10-CM | POA: Diagnosis present

## 2017-08-23 DIAGNOSIS — I248 Other forms of acute ischemic heart disease: Secondary | ICD-10-CM | POA: Diagnosis present

## 2017-08-23 DIAGNOSIS — E875 Hyperkalemia: Secondary | ICD-10-CM | POA: Diagnosis present

## 2017-08-23 DIAGNOSIS — I2510 Atherosclerotic heart disease of native coronary artery without angina pectoris: Secondary | ICD-10-CM | POA: Diagnosis present

## 2017-08-23 LAB — MRSA PCR SCREENING: MRSA by PCR: NEGATIVE

## 2017-08-23 LAB — TROPONIN I: TROPONIN I: 0.05 ng/mL — AB (ref <0.03)

## 2017-08-23 LAB — PROCALCITONIN: Procalcitonin: 0.35 ng/mL

## 2017-08-23 MED ORDER — ATORVASTATIN CALCIUM 20 MG PO TABS
80.0000 mg | ORAL_TABLET | Freq: Every day | ORAL | Status: DC
  Administered 2017-08-23 – 2017-08-31 (×9): 80 mg via ORAL
  Filled 2017-08-23 (×6): qty 4
  Filled 2017-08-23: qty 8
  Filled 2017-08-23 (×3): qty 4

## 2017-08-23 MED ORDER — METOPROLOL TARTRATE 50 MG PO TABS
50.0000 mg | ORAL_TABLET | Freq: Two times a day (BID) | ORAL | Status: DC
  Administered 2017-08-23 – 2017-08-29 (×13): 50 mg via ORAL
  Filled 2017-08-23 (×13): qty 1

## 2017-08-23 MED ORDER — POLYETHYLENE GLYCOL 3350 17 G PO PACK
17.0000 g | PACK | Freq: Every day | ORAL | Status: DC | PRN
  Administered 2017-08-25 – 2017-09-01 (×4): 17 g via ORAL
  Filled 2017-08-23 (×5): qty 1

## 2017-08-23 MED ORDER — GABAPENTIN 300 MG PO CAPS
300.0000 mg | ORAL_CAPSULE | Freq: Two times a day (BID) | ORAL | Status: DC
  Administered 2017-08-23 – 2017-08-26 (×8): 300 mg via ORAL
  Filled 2017-08-23 (×8): qty 1

## 2017-08-23 MED ORDER — SODIUM CHLORIDE 0.9 % IV SOLN
1250.0000 mg | Freq: Once | INTRAVENOUS | Status: AC
  Administered 2017-08-23: 1250 mg via INTRAVENOUS
  Filled 2017-08-23: qty 1250

## 2017-08-23 MED ORDER — UMECLIDINIUM-VILANTEROL 62.5-25 MCG/INH IN AEPB
1.0000 | INHALATION_SPRAY | Freq: Every day | RESPIRATORY_TRACT | Status: DC
  Administered 2017-08-23 – 2017-08-27 (×5): 1 via RESPIRATORY_TRACT
  Filled 2017-08-23: qty 14

## 2017-08-23 MED ORDER — VANCOMYCIN HCL 10 G IV SOLR
1250.0000 mg | INTRAVENOUS | Status: DC
  Administered 2017-08-23 – 2017-08-24 (×2): 1250 mg via INTRAVENOUS
  Filled 2017-08-23 (×2): qty 1250

## 2017-08-23 MED ORDER — SPIRONOLACTONE 25 MG PO TABS
25.0000 mg | ORAL_TABLET | ORAL | Status: DC
  Administered 2017-08-23 – 2017-08-25 (×2): 25 mg via ORAL
  Filled 2017-08-23 (×2): qty 1

## 2017-08-23 MED ORDER — LEVALBUTEROL HCL 1.25 MG/0.5ML IN NEBU
1.2500 mg | INHALATION_SOLUTION | Freq: Four times a day (QID) | RESPIRATORY_TRACT | Status: DC
  Filled 2017-08-23 (×2): qty 0.5

## 2017-08-23 MED ORDER — TORSEMIDE 20 MG PO TABS: 40.0000 mg | ORAL_TABLET | Freq: Two times a day (BID) | ORAL | Status: DC

## 2017-08-23 MED ORDER — FUROSEMIDE 10 MG/ML IJ SOLN
40.0000 mg | Freq: Two times a day (BID) | INTRAMUSCULAR | Status: DC
  Administered 2017-08-23 – 2017-09-01 (×20): 40 mg via INTRAVENOUS
  Filled 2017-08-23 (×20): qty 4

## 2017-08-23 MED ORDER — MELATONIN 5 MG PO TABS
20.0000 mg | ORAL_TABLET | Freq: Every day | ORAL | Status: DC | PRN
  Administered 2017-08-23 – 2017-08-26 (×4): 20 mg via ORAL
  Filled 2017-08-23 (×6): qty 4

## 2017-08-23 MED ORDER — AZELASTINE HCL 0.1 % NA SOLN
1.0000 | Freq: Two times a day (BID) | NASAL | Status: DC | PRN
Start: 2017-08-23 — End: 2017-09-02
  Filled 2017-08-23: qty 30

## 2017-08-23 MED ORDER — IPRATROPIUM BROMIDE 0.02 % IN SOLN
0.5000 mg | Freq: Four times a day (QID) | RESPIRATORY_TRACT | Status: DC
  Administered 2017-08-23 – 2017-08-28 (×22): 0.5 mg via RESPIRATORY_TRACT
  Filled 2017-08-23 (×23): qty 2.5

## 2017-08-23 MED ORDER — ALLOPURINOL 100 MG PO TABS
100.0000 mg | ORAL_TABLET | Freq: Every day | ORAL | Status: DC
  Administered 2017-08-23 – 2017-09-01 (×10): 100 mg via ORAL
  Filled 2017-08-23 (×10): qty 1

## 2017-08-23 MED ORDER — IPRATROPIUM BROMIDE 0.02 % IN SOLN: 0.5000 mg | Freq: Four times a day (QID) | RESPIRATORY_TRACT | Status: DC

## 2017-08-23 MED ORDER — LEVALBUTEROL HCL 1.25 MG/0.5ML IN NEBU
1.2500 mg | INHALATION_SOLUTION | Freq: Four times a day (QID) | RESPIRATORY_TRACT | Status: DC
  Administered 2017-08-23 – 2017-08-28 (×21): 1.25 mg via RESPIRATORY_TRACT
  Filled 2017-08-23 (×25): qty 0.5

## 2017-08-23 MED ORDER — ACETAMINOPHEN 325 MG PO TABS
650.0000 mg | ORAL_TABLET | Freq: Four times a day (QID) | ORAL | Status: DC | PRN
  Administered 2017-08-23 – 2017-08-30 (×9): 650 mg via ORAL
  Filled 2017-08-23 (×10): qty 2

## 2017-08-23 MED ORDER — APIXABAN 2.5 MG PO TABS
2.5000 mg | ORAL_TABLET | Freq: Two times a day (BID) | ORAL | Status: DC
  Administered 2017-08-23 – 2017-08-28 (×11): 2.5 mg via ORAL
  Filled 2017-08-23 (×12): qty 1

## 2017-08-23 MED ORDER — SODIUM CHLORIDE 0.9 % IV SOLN
1.0000 g | Freq: Three times a day (TID) | INTRAVENOUS | Status: DC
  Administered 2017-08-23 – 2017-08-24 (×4): 1 g via INTRAVENOUS
  Filled 2017-08-23 (×6): qty 1

## 2017-08-23 MED ORDER — COQ-10 200 MG PO CAPS: 200.0000 mg | ORAL_CAPSULE | Freq: Every evening | ORAL | Status: DC

## 2017-08-23 NOTE — Care Management (Addendum)
Patient admitted for PNA and pulmonary edema. On IV abx and lasix treatments. Patient tells me he uses oxygen right now on an as needed basis but his MD told him it is likely that he will now be on oxygen full time. He has concentrator and portable tanks at home. Patient also has a nebulizer machine that "has missing parts" and he has never been able to use it. CPAP at night. Will encourage MD to write for a nebulizer at discharge if appropriate. Patient has rollator walker in the home and has  No other needs. RNCM will continue to follow.  Ines Bloomer RN BSN RNCM [phone]

## 2017-08-23 NOTE — Progress Notes (Signed)
Received call from RN., admitting MD Dr Duane Boston wants to see if patient can be changed to cpap vs bipap since he uses cpap at home. Mode changed. Patient tolerating cpap of 14 well. Volumes noted good. Daughter remains at bedside.

## 2017-08-23 NOTE — ED Notes (Signed)
Pt placed on CPAP by RT Dee. Pt tolerating CPAP well.

## 2017-08-23 NOTE — Progress Notes (Signed)
Pt and wife requesting removal of Unas boots, pt stating "they are too tight", Unas boots removed per pt and family request, pt educated and encouraged to elevate feet, and offered to reapply Unas boots with refusal at this time.

## 2017-08-23 NOTE — Plan of Care (Signed)
  Problem: Education: Goal: Knowledge of General Education information will improve Description: Including pain rating scale, medication(s)/side effects and non-pharmacologic comfort measures Outcome: Progressing   Problem: Clinical Measurements: Goal: Respiratory complications will improve Outcome: Progressing   Problem: Activity: Goal: Risk for activity intolerance will decrease Outcome: Progressing   

## 2017-08-23 NOTE — H&P (Signed)
[facility] NAME: Norman Morales    MR#:  [id number]  DATE OF BIRTH:  [birth year]  DATE OF ADMISSION:  08/22/2017  PRIMARY CARE PHYSICIAN: Adin Hector, MD   REQUESTING/REFERRING PHYSICIAN:   CHIEF COMPLAINT:   Chief Complaint  Patient presents with  . Shortness of Breath    HISTORY OF PRESENT ILLNESS: Norman Morales  is a 84 y.o. male with a known history of atrial fibrillation, CKD, CHF, coronary artery disease, osteoarthritis, obstructive sleep apnea, hypertension and other comorbidities. Patient presented to emergency room for worsening shortness of breath, associated with occasional productive cough and hypoxia going on for the past, few hours, gradually getting worse.  Per family, he has been hypoxic with oxygen saturation in low 80s, on oxygen, at home.  He is spending most of his time sitting up so he can breathe better.  His symptoms improved in the emergency room on BiPAP. No reports of chest pain, fever, chills, nausea, vomiting, diarrhea, bleeding. Patient was just hospitalized and treated for acute respiratory failure from CHF and pneumonia. Blood test done emergency room are notable for creatinine level of 1.42.  WBC is elevated at 14.5.  Troponin level is 0.05. EKG shows atrial fibrillation with heart rate at 94, no acute ischemic changes. Chest x-ray, reviewed by myself, shows cardiomegaly with worsening pulmonary vascular congestion and superimposed pneumonia at the left base.   PAST MEDICAL HISTORY:   Past Medical History:  Diagnosis Date  . A-fib ([place])   . Adrenal adenoma   . CHF (congestive heart failure) ([place])   . Coronary artery disease   . DDD (degenerative disc disease), cervical   . Hyperlipidemia   . Hypertension   . Lung nodule   . Restrictive lung disease     PAST SURGICAL HISTORY:  Past Surgical History:  Procedure Laterality Date  . CARDIAC SURGERY    . CATARACT EXTRACTION, BILATERAL     . CORONARY ARTERY BYPASS GRAFT    . EXPLORATION POST OPERATIVE OPEN HEART    . LUMBAR FUSION    . PERIPHERAL VASCULAR CATHETERIZATION Left 12/30/2015   Procedure: Lower Extremity Angiography;  Surgeon: Algernon Huxley, MD;  Location: [facility];  Service: Cardiovascular;  Laterality: Left;  . PERIPHERAL VASCULAR CATHETERIZATION  12/30/2015   Procedure: Lower Extremity Intervention;  Surgeon: Algernon Huxley, MD;  Location: [facility];  Service: Cardiovascular;;  . REPLACEMENT TOTAL KNEE BILATERAL      SOCIAL HISTORY:  Social History   Tobacco Use  . Smoking status: Former Smoker    Packs/day: 1.00    Years: 42.00    Pack years: 42.00    Types: Cigarettes  . Smokeless tobacco: Never Used  . Tobacco comment: quit 42 years ago  Substance Use Topics  . Alcohol use: Not Currently    Comment: occasionally    FAMILY HISTORY:  Family History  Problem Relation Age of Onset  . Aortic aneurysm Mother     DRUG ALLERGIES:  Allergies  Allergen Reactions  . Ambien [Zolpidem Tartrate] Other (See Comments)    Nightmares/ yelling out     REVIEW OF SYSTEMS:   CONSTITUTIONAL: No fever, but patient complains of fatigue and generalized weakness.  EYES: No blurred or double vision.  EARS, NOSE, AND THROAT: No tinnitus or ear pain.  RESPIRATORY: Positive for cough, shortness of breath, wheezing, no hemoptysis.  CARDIOVASCULAR: No chest pain, but positive for orthopnea, edema.  GASTROINTESTINAL: No nausea, vomiting, diarrhea or abdominal pain.  GENITOURINARY: No dysuria, hematuria.  ENDOCRINE: No polyuria, nocturia,  HEMATOLOGY: No anemia, bleeding SKIN: No rash or lesion. MUSCULOSKELETAL: Has active history of osteoarthritis.   NEUROLOGIC: No focal weakness.  PSYCHIATRY: No anxiety or depression.   MEDICATIONS AT HOME:  Prior to Admission medications   Medication Sig Start Date End Date Taking? Authorizing Provider  albuterol (PROVENTIL HFA;VENTOLIN HFA) 108 (90  Base) MCG/ACT inhaler Inhale 2 puffs into the lungs every 4 (four) hours as needed for wheezing or shortness of breath. 08/22/17  Yes Wieting, Richard, MD  allopurinol (ZYLOPRIM) 100 MG tablet Take 1 tablet (100 mg total) by mouth daily. 06/10/17  Yes Gladstone Lighter, MD  ammonium lactate (AMLACTIN) 12 % cream Apply topically 2 (two) times daily as needed for dry skin.   Yes [provider]  apixaban (ELIQUIS) 2.5 MG TABS tablet Take 1 tablet (2.5 mg total) by mouth 2 (two) times daily. 08/22/17  Yes Wieting, Richard, MD  atorvastatin (LIPITOR) 80 MG tablet TAKE ONE TABLET AT BEDTIME 04/15/15  Yes [provider]  azelastine (ASTELIN) 0.1 % nasal spray Place 1-2 sprays into the nose 2 (two) times daily as needed for rhinitis.    Yes [provider]  cefdinir (OMNICEF) 300 MG capsule Take 1 capsule (300 mg total) by mouth every 12 (twelve) hours. 08/22/17  Yes Wieting, Richard, MD  Coenzyme Q10 (COQ-10) 200 MG CAPS Take 200 mg by mouth every evening.   Yes [provider]  gabapentin (NEURONTIN) 300 MG capsule Take 1 capsule (300MG ) by mouth every morning and 2 capsules (600MG ) by mouth every evening   Yes [provider]  Melatonin 10 MG TABS Take 20 mg by mouth daily as needed (sleep).    Yes [provider]  metoprolol tartrate (LOPRESSOR) 50 MG tablet Take 50 mg by mouth 2 (two) times daily.    Yes [provider]  polyethylene glycol (MIRALAX / GLYCOLAX) packet Take 17 g by mouth daily as needed for mild constipation. 08/22/17  Yes Wieting, Richard, MD  spironolactone (ALDACTONE) 25 MG tablet Take 25 mg by mouth every other day.    Yes [provider]  torsemide (DEMADEX) 20 MG tablet Take 2 tablets (40 mg total) by mouth 2 (two) times daily. 06/10/17  Yes Gladstone Lighter, MD  umeclidinium-vilanterol (ANORO ELLIPTA) 62.5-25 MCG/INH AEPB Inhale 1 puff into the lungs daily.  05/16/14  Yes [provider]      PHYSICAL  EXAMINATION:   VITAL SIGNS: Blood pressure (!) 156/103, pulse 82, temperature 98.2 F (36.8 C), resp. rate (!) 26, height 6' (1.829 m), weight 125.2 kg (276 lb), SpO2 100 %.  GENERAL:  84 y.o.-year-old patient lying in the bed with moderate respiratory distress.  EYES: Pupils equal, round, reactive to light and accommodation. No scleral icterus. Extraocular muscles intact.  HEENT: Head atraumatic, normocephalic. Oropharynx and nasopharynx clear.  NECK:  Supple, no jugular venous distention. No thyroid enlargement, no tenderness.  LUNGS: Reduced breath sounds and wheezing noted bilaterally.  Bilateral crackles are heard as well.  Mild use of accessory muscles of respiration, noted.  CARDIOVASCULAR: S1, S2 normal. No murmurs, rubs, or gallops.  ABDOMEN: Soft, nontender, nondistended. Bowel sounds present. No organomegaly or mass.  EXTREMITIES: 1+ bilateral pedal edema noted. NEUROLOGIC: No focal weakness. PSYCHIATRIC: The patient is alert and oriented x 3.  SKIN: No obvious rash, lesion, or ulcer.   LABORATORY PANEL:   CBC Recent Labs  Lab  08/18/17 1206 08/19/17 1339 08/20/17 0527 08/21/17 0822 08/22/17 2057  WBC 9.2 10.1 14.2* 11.0* 14.5*  HGB 12.0* 12.1* 11.7* 12.0* 14.3  HCT 35.8* 37.0* 35.7* 37.6* 44.4  PLT 164 170 172 176 168  MCV 94.0 95.7 95.3 96.1 96.1  MCH 31.5 31.3 31.2 30.8 31.0  MCHC 33.5 32.7 32.7 32.1 32.3  RDW 19.3* 19.6* 19.4* 19.2* 19.3*   ------------------------------------------------------------------------------------------------------------------  Chemistries  Recent Labs  Lab 08/19/17 1339 08/20/17 0527 08/21/17 0822 08/22/17 0648 08/22/17 2057  NA 137 138 135 134* 136  K 4.4 5.3* 5.2* 4.8 4.6  CL 94* 97* 91* 93* 93*  CO2 35* 35* 36* 33* 33*  GLUCOSE 146* 137* 142* 130* 123*  BUN 48* 54* 59* 64* 61*  CREATININE 1.37* 1.50* 1.61* 1.66* 1.42*  CALCIUM 9.0 8.8* 8.9 8.7* 8.7*  MG  --   --  2.4  --   --     ------------------------------------------------------------------------------------------------------------------ estimated creatinine clearance is 48.2 mL/min (A) (by C-G formula based on SCr of 1.42 mg/dL (H)). ------------------------------------------------------------------------------------------------------------------ No results for input(s): TSH, T4TOTAL, T3FREE, THYROIDAB in the last 72 hours.  Invalid input(s): FREET3   Coagulation profile No results for input(s): INR, PROTIME in the last 168 hours. ------------------------------------------------------------------------------------------------------------------- No results for input(s): DDIMER in the last 72 hours. -------------------------------------------------------------------------------------------------------------------  Cardiac Enzymes Recent Labs  Lab 08/18/17 1206 08/22/17 2057  TROPONINI <0.03 0.05*   ------------------------------------------------------------------------------------------------------------------ Invalid input(s): POCBNP  ---------------------------------------------------------------------------------------------------------------  Urinalysis    Component Value Date/Time   COLORURINE YELLOW (A) 08/19/2017 1633   APPEARANCEUR CLEAR (A) 08/19/2017 1633   LABSPEC 1.011 08/19/2017 1633   PHURINE 6.0 08/19/2017 1633   GLUCOSEU NEGATIVE 08/19/2017 1633   HGBUR NEGATIVE 08/19/2017 1633   BILIRUBINUR NEGATIVE 08/19/2017 1633   KETONESUR NEGATIVE 08/19/2017 1633   PROTEINUR NEGATIVE 08/19/2017 1633   NITRITE NEGATIVE 08/19/2017 1633   LEUKOCYTESUR NEGATIVE 08/19/2017 1633     RADIOLOGY: Dg Chest 2 View  Result Date: 08/21/2017 CLINICAL DATA:  Cough. EXAM: CHEST - 2 VIEW COMPARISON:  08/18/2017 FINDINGS: Sequelae of CABG are again identified with multiple fractured sternal wires. The cardiac silhouette remains enlarged. Pulmonary vascular congestion is stable to mildly increased.  There is chronic nodularity in the left lung apex. Streaky opacities in both lung bases are similar to the prior study and suggest atelectasis. No pleural effusion or pneumothorax is identified. IMPRESSION: 1. Cardiomegaly with mildly worsening pulmonary vascular congestion. 2. Low lung volumes with bibasilar atelectasis. Electronically Signed   By: Logan Bores M.D.   On: 08/21/2017 10:53   Dg Chest Port 1 View  Result Date: 08/22/2017 CLINICAL DATA:  Shortness of breath EXAM: PORTABLE CHEST 1 VIEW COMPARISON:  August 21, 2017 FINDINGS: There is moderate interstitial edema. There is patchy consolidation in the left base. There is a minimal left pleural effusion. There is cardiomegaly with pulmonary venous hypertension. There is aortic atherosclerosis. Patient is status post coronary artery bypass grafting. Multiple sternal wires are fractured, a stable finding. No bone lesions. IMPRESSION: Cardiomegaly with pulmonary venous hypertension. Interstitial edema. Suspect a degree of congestive heart failure. Probable superimposed pneumonia left base. There is aortic atherosclerosis. Aortic Atherosclerosis (ICD10-I70.0). Electronically Signed   By: Lowella Grip III M.D.   On: 08/22/2017 21:33    EKG: Orders placed or performed during the hospital encounter of 08/22/17  . ED EKG within 10 minutes  . ED EKG within 10 minutes  . EKG 12-Lead  . EKG 12-Lead    IMPRESSION AND PLAN:  1.  Acute  on chronic respiratory failure with hypoxia, secondary to acute pulmonary edema and new pneumonia.  We will start treatment with IV Lasix and broad-spectrum antibiotics for HCAP.  Continue oxygen therapy and CPAP at night. 2.  Acute pulmonary edema see treatment as above under #1.  Cardiology is consulted for further evaluation and treatment. 3. HCAP, will start treatment with cefepime and vancomycin IV.  Will use nebulizer treatment with Xopenex and ipratropium.  Continue oxygen therapy as needed to maintain oxygen  saturation above 92 %. 4.  Borderline elevated troponin level, 0.05, likely secondary to demand ischemia from acute respiratory failure.  We will continue to monitor patient on telemetry and follow troponin levels, rule out ACS. 5.  CKD 3.  Creatinine is at baseline, 1.42.  Continue to monitor kidney function closely and avoid nephrotoxic medications. 6.  Chronic atrial fibrillation, currently rate controlled.  Continue Eliquis for long-term anticoagulation.   All the records are reviewed and case discussed with ED provider. Management plans discussed with the patient, family and they are in agreement.  CODE STATUS: Full  Code Status History    Date Active Date Inactive Code Status Order ID Comments User Context   08/18/2017 1828 08/22/2017 1725 Full Code [id number]  Gladstone Lighter, MD Inpatient   06/04/2017 1414 06/10/2017 1744 DNR [id number]  Hillary Bow, MD Inpatient   06/04/2017 1244 06/04/2017 1414 Full Code [id number]  Hillary Bow, MD Inpatient   12/23/2016 0806 12/27/2016 1617 Full Code [id number]  Lance Coon, MD Inpatient   12/23/2016 0714 12/23/2016 0806 Full Code [id number]  Hillary Bow, MD ED   12/30/2015 1429 12/30/2015 1859 Full Code [id number]  Dew, Erskine Squibb, MD Inpatient    Advance Directive Documentation     Most Recent Value  Type of Advance Directive  Living will  Pre-existing out of facility DNR order (yellow form or pink MOST form)  -  "MOST" Form in Place?  -       TOTAL TIME TAKING CARE OF THIS PATIENT: 50 minutes.    Amelia Jo M.D on 08/23/2017 at 1:25 AM  Between 7am to 6pm - Pager - [phone]  After 6pm go to [email or web address] - password [email or web address] [facility]  [facility]  Office  [phone]  CC: Primary care physician; Adin Hector, MD

## 2017-08-23 NOTE — Progress Notes (Signed)
Pharmacy Antibiotic Note  Norman Morales is a 84 y.o. male admitted on 08/22/2017 with pneumonia.  Pharmacy has been consulted for Vancomycin dosing. Patient is also ordered Cefepime.  Plan: Vancomycin 1250 IV every 18 hours.  Goal trough 15-20 mcg/mL. Will check trough level prior to 5th dose  Height: 6' (182.9 cm) Weight: 275 lb 6.4 oz (124.9 kg) IBW/kg (Calculated) : 77.6  Temp (24hrs), Avg:98 F (36.7 C), Min:97.5 F (36.4 C), Max:98.7 F (37.1 C)  Recent Labs  Lab 08/18/17 1206 08/19/17 1339 08/20/17 0527 08/21/17 0822 08/22/17 0648 08/22/17 2057  WBC 9.2 10.1 14.2* 11.0*  --  14.5*  CREATININE 1.58* 1.37* 1.50* 1.61* 1.66* 1.42*    Estimated Creatinine Clearance: 48.1 mL/min (A) (by C-G formula based on SCr of 1.42 mg/dL (H)).    Allergies  Allergen Reactions  . Ambien [Zolpidem Tartrate] Other (See Comments)    Nightmares/ yelling out     Antimicrobials this admission: Vancomycin 7/22 >>  Cefepime 7/22 >>   Thank you for allowing pharmacy to be a part of this patient's care.  Paulina Fusi, PharmD, BCPS 08/23/2017 2:35 AM

## 2017-08-23 NOTE — Consult Note (Signed)
Reason for Consult: Congestive heart failure shortness of breath hypoxemia Referring Physician: Dr. Leslye Peer hospitalist Dr. Caryl Comes primary Cardiologist Dr. Coralee North is an 84 y.o. male.  HPI: Patient is a 84 year old obese white male with COPD shortness of breath dyspnea patient has obstructive sleep apnea on CPAP has significant peripheral leg edema complained of recent congestion and dyspnea cough and congestion.  Patient has atrial fibrillation as well he was recently discharged after being referred to the hospital for respiratory failure he was eager to go home but then got worse at home with more hypoxemia and dyspnea and was forced to come back to the hospital be readmitted for therapy and diuresis.. Patient denies any significant chest pain still has leg edema with venous stasis changes and having his legs wrapped feels much better now sitting up eating with family in the room  Past Medical History:  Diagnosis Date  . A-fib ([place])   . Adrenal adenoma   . CHF (congestive heart failure) ([place])   . Coronary artery disease   . DDD (degenerative disc disease), cervical   . Hyperlipidemia   . Hypertension   . Lung nodule   . Restrictive lung disease     Past Surgical History:  Procedure Laterality Date  . CARDIAC SURGERY    . CATARACT EXTRACTION, BILATERAL    . CORONARY ARTERY BYPASS GRAFT    . EXPLORATION POST OPERATIVE OPEN HEART    . LUMBAR FUSION    . PERIPHERAL VASCULAR CATHETERIZATION Left 12/30/2015   Procedure: Lower Extremity Angiography;  Surgeon: Algernon Huxley, MD;  Location: [facility];  Service: Cardiovascular;  Laterality: Left;  . PERIPHERAL VASCULAR CATHETERIZATION  12/30/2015   Procedure: Lower Extremity Intervention;  Surgeon: Algernon Huxley, MD;  Location: [facility];  Service: Cardiovascular;;  . REPLACEMENT TOTAL KNEE BILATERAL      Family History  Problem Relation Age of Onset  . Aortic aneurysm Mother     Social History:   reports that he has quit smoking. His smoking use included cigarettes. He has a 42.00 pack-year smoking history. He has never used smokeless tobacco. He reports that he drank alcohol. He reports that he does not use drugs.  Allergies:  Allergies  Allergen Reactions  . Ambien [Zolpidem Tartrate] Other (See Comments)    Nightmares/ yelling out     Medications: I have reviewed the patient's current medications.  Results for orders placed or performed during the hospital encounter of 08/22/17 (from the past 48 hour(s))  CBC     Status: Abnormal   Collection Time: 08/22/17  8:57 PM  Result Value Ref Range   WBC 14.5 (H) 3.8 - 10.6 K/uL   RBC 4.61 4.40 - 5.90 MIL/uL   Hemoglobin 14.3 13.0 - 18.0 g/dL   HCT 44.4 40.0 - 52.0 %   MCV 96.1 80.0 - 100.0 fL   MCH 31.0 26.0 - 34.0 pg   MCHC 32.3 32.0 - 36.0 g/dL   RDW 19.3 (H) 11.5 - 14.5 %   Platelets 168 150 - 440 K/uL    Comment: Performed at [facility], [address] [place], [place] [address]  Basic metabolic panel     Status: Abnormal   Collection Time: 08/22/17  8:57 PM  Result Value Ref Range   Sodium 136 135 - 145 mmol/L   Potassium 4.6 3.5 - 5.1 mmol/L   Chloride 93 (L) 98 - 111 mmol/L    Comment: Please note change in  reference range.   CO2 33 (H) 22 - 32 mmol/L   Glucose, Bld 123 (H) 70 - 99 mg/dL    Comment: Please note change in reference range.   BUN 61 (H) 8 - 23 mg/dL    Comment: Please note change in reference range.   Creatinine, Ser 1.42 (H) 0.61 - 1.24 mg/dL   Calcium 8.7 (L) 8.9 - 10.3 mg/dL   GFR calc non Af Amer 42 (L) >60 mL/min   GFR calc Af Amer 49 (L) >60 mL/min    Comment: (NOTE) The eGFR has been calculated using the CKD EPI equation. This calculation has not been validated in all clinical situations. eGFR's persistently <60 mL/min signify possible Chronic Kidney Disease.    Anion gap 10 5 - 15    Comment: Performed at [facility], [address] [place], [place] [address]   Troponin I     Status: Abnormal   Collection Time: 08/22/17  8:57 PM  Result Value Ref Range   Troponin I 0.05 (HH) <0.03 ng/mL    Comment: CRITICAL RESULT CALLED TO, READ BACK BY AND VERIFIED WITH JESSICA REAVES ON 08/22/17 AT [address] [facility] Performed at [facility], [address] [place], [place] [address]   Culture, blood (routine x 2) Call MD if unable to obtain prior to antibiotics being given     Status: None (Preliminary result)   Collection Time: 08/23/17  2:58 AM  Result Value Ref Range   Specimen Description BLOOD LEFT ANTECUBITAL    Special Requests      BOTTLES DRAWN AEROBIC AND ANAEROBIC Blood Culture adequate volume   Culture      NO GROWTH < 12 HOURS Performed at [facility], [address] [place], [place] [address]    Report Status PENDING   Troponin I     Status: Abnormal   Collection Time: 08/23/17  2:59 AM  Result Value Ref Range   Troponin I 0.05 (HH) <0.03 ng/mL    Comment: CRITICAL VALUE NOTED. VALUE IS CONSISTENT WITH PREVIOUSLY REPORTED/CALLED VALUE SNJ Performed at [facility], [address] [place], [place] [address]   Culture, blood (routine x 2) Call MD if unable to obtain prior to antibiotics being given     Status: None (Preliminary result)   Collection Time: 08/23/17  3:04 AM  Result Value Ref Range   Specimen Description BLOOD RIGHT ANTECUBITAL    Special Requests      BOTTLES DRAWN AEROBIC AND ANAEROBIC Blood Culture adequate volume   Culture      NO GROWTH < 12 HOURS Performed at [facility], [address] [place],  [address]    Report Status PENDING     Dg Chest Port 1 View  Result Date: 08/22/2017 CLINICAL DATA:  Shortness of breath EXAM: PORTABLE CHEST 1 VIEW COMPARISON:  August 21, 2017 FINDINGS: There is moderate interstitial edema. There is patchy consolidation in the left base. There is a minimal left pleural effusion. There is cardiomegaly with pulmonary venous hypertension. There is  aortic atherosclerosis. Patient is status post coronary artery bypass grafting. Multiple sternal wires are fractured, a stable finding. No bone lesions. IMPRESSION: Cardiomegaly with pulmonary venous hypertension. Interstitial edema. Suspect a degree of congestive heart failure. Probable superimposed pneumonia left base. There is aortic atherosclerosis. Aortic Atherosclerosis (ICD10-I70.0). Electronically Signed   By: Lowella Grip III M.D.   On: 08/22/2017 21:33    Review of Systems  Constitutional: Positive for diaphoresis and malaise/fatigue.  HENT: Positive for congestion.   Eyes: Negative.   Respiratory: Positive for shortness of breath.   Cardiovascular: Positive for palpitations, orthopnea, leg swelling and PND.  Gastrointestinal: Positive for heartburn.  Genitourinary: Negative.   Musculoskeletal: Negative.   Skin: Negative.   Neurological: Negative.   Endo/Heme/Allergies: Negative.   Psychiatric/Behavioral: Negative.    Blood pressure (!) 141/82, pulse (!) 108, temperature 98.3 F (36.8 C), temperature source Oral, resp. rate 20, height 6' (1.829 m), weight 275 lb 6.4 oz (124.9 kg), SpO2 94 %. Physical Exam  Nursing note and vitals reviewed. Constitutional: He is oriented to person, place, and time. He appears well-developed and well-nourished.  HENT:  Head: Normocephalic and atraumatic.  Eyes: Pupils are equal, round, and reactive to light. Conjunctivae and EOM are normal.  Neck: Normal range of motion. Neck supple.  Cardiovascular: Normal rate and regular rhythm. Exam reveals gallop.  Murmur heard. Respiratory: Effort normal and breath sounds normal.  GI: Soft. Bowel sounds are normal.  Musculoskeletal: Normal range of motion. He exhibits edema.  Neurological: He is alert and oriented to person, place, and time. He has normal reflexes.  Skin: Skin is warm and dry.  Psychiatric: He has a normal mood and affect.    Assessment/Plan: Acute pulmonary edema Congestive  heart failure Obstructive sleep apnea Obesity Shortness of breath Leg edema Chronic venous stasis Hypoxemia Chronic renal insufficiency Coronary artery disease Borderline troponin Atrial fibrillation . Plan Agree with admit to telemetry Continue broad-spectrum antibody therapy for possible COPD and bronchitis Supplemental oxygen as necessary Recommend CPAP or BiPAP Continue supplemental oxygen therapy Diuretic therapy as necessary Elevate legs Continue lipid management with Lipitor Agree with Eliquis for anticoagulation for A. Fib Inhalers for COPD symptoms   Dwayne D Callwood 08/23/2017, 12:10 PM

## 2017-08-23 NOTE — Progress Notes (Signed)
Patient now on telemetry floor doing well on cpap. He is resting well in recliner. Sleeping on cpap for 14 40%. Vitals stable. No distress noted at this time.

## 2017-08-23 NOTE — Progress Notes (Signed)
PHARMACIST - PHYSICIAN ORDER COMMUNICATION  CONCERNING: P&T Medication Policy on Herbal Medications  DESCRIPTION:  This patient's order for:  CoQ-10  has been noted.  This product(s) is classified as an "herbal" or natural product. Due to a lack of definitive safety studies or FDA approval, nonstandard manufacturing practices, plus the potential risk of unknown drug-drug interactions while on inpatient medications, the Pharmacy and Therapeutics Committee does not permit the use of "herbal" or natural products of this type within [facility].   ACTION TAKEN: The pharmacy department is unable to verify this order at this time and your patient has been informed of this safety policy. Please reevaluate patient's clinical condition at discharge and address if the herbal or natural product(s) should be resumed at that time.  Paulina Fusi, PharmD, BCPS 08/23/2017 2:28 AM

## 2017-08-23 NOTE — Progress Notes (Signed)
PT refused to wear Bipap. Stated he would call if he changed his mind during the night.

## 2017-08-23 NOTE — Plan of Care (Signed)
  Problem: Education: Goal: Knowledge of General Education information will improve Description: Including pain rating scale, medication(s)/side effects and non-pharmacologic comfort measures Outcome: Progressing   Problem: Clinical Measurements: Goal: Ability to maintain clinical measurements within normal limits will improve Outcome: Progressing Goal: Respiratory complications will improve Outcome: Progressing   

## 2017-08-23 NOTE — Progress Notes (Signed)
Patient ID: Norman Morales, male   DOB: 11/13/1926, 84 y.o.   MRN: [id number]  ACP note  Patient and wife at the bedside  Diagnosis: Acute on chronic hypoxic respiratory failure, left lower lobe pneumonia, acute on chronic diastolic congestive heart failure, COPD on oxygen, chronic kidney disease stage III, atrial fibrillation, hypertension  CODE STATUS discussed.  Patient wishes to be a full code at this point time.  Plan.  Since the patient went home and came right back to the hospital I am nervous about sending this patient home.  Continue IV diuresis, nebulizer treatments antibiotics.  Send off an MRSA PCR and procalcitonin.   Time spent on ACP discussion 30 minutes Dr. Loletha Grayer

## 2017-08-24 LAB — HIV ANTIBODY (ROUTINE TESTING W REFLEX): HIV Screen 4th Generation wRfx: NONREACTIVE

## 2017-08-24 LAB — PROCALCITONIN: Procalcitonin: 0.25 ng/mL

## 2017-08-24 MED ORDER — TRAZODONE HCL 50 MG PO TABS
50.0000 mg | ORAL_TABLET | Freq: Every day | ORAL | Status: DC
  Administered 2017-08-24 – 2017-08-31 (×8): 50 mg via ORAL
  Filled 2017-08-24 (×9): qty 1

## 2017-08-24 MED ORDER — SODIUM CHLORIDE 0.9 % IV SOLN
2.0000 g | INTRAVENOUS | Status: DC
  Administered 2017-08-25 – 2017-09-01 (×8): 2 g via INTRAVENOUS
  Filled 2017-08-24 (×9): qty 2

## 2017-08-24 NOTE — Care Management (Signed)
Confirmed with [facility] that patient's oxygen order with [facility] is for continuous, although patient uses it as he feels it is needed.

## 2017-08-24 NOTE — Progress Notes (Signed)
Patient ID: Norman Morales, male   DOB: [birth year], 84 y.o.   MRN: [id number]  [place] PROGRESS NOTE  LATHAN GIESELMAN EFE:[id number] DOB: [birth year] DOA: 08/22/2017 PCP: Adin Hector, MD  HPI/Subjective: Patient feels okay.  States he is breathing better.  Still having some drainage from his left leg.  Not coughing up much at all at this point.  Objective: Vitals:   08/24/17 0824 08/24/17 1321  BP:    Pulse:    Resp:    Temp:    SpO2: 94% 94%    Filed Weights   08/22/17 2102 08/23/17 0221 08/24/17 0332  Weight: 125.2 kg (276 lb) 124.9 kg (275 lb 6.4 oz) 125.1 kg (275 lb 11.2 oz)    ROS: Review of Systems  Constitutional: Negative for chills and fever.  Eyes: Negative for blurred vision.  Respiratory: Positive for cough and shortness of breath.   Cardiovascular: Negative for chest pain.  Gastrointestinal: Negative for abdominal pain, constipation, diarrhea, nausea and vomiting.  Genitourinary: Negative for dysuria.  Musculoskeletal: Negative for joint pain.  Neurological: Negative for dizziness and headaches.   Exam: Physical Exam  Constitutional: He is oriented to person, place, and time.  HENT:  Nose: No mucosal edema.  Mouth/Throat: No oropharyngeal exudate or posterior oropharyngeal edema.  Eyes: Pupils are equal, round, and reactive to light. Conjunctivae, EOM and lids are normal.  Neck: No JVD present. Carotid bruit is not present. No edema present. No thyroid mass and no thyromegaly present.  Cardiovascular: S1 normal and S2 normal. Exam reveals no gallop.  No murmur heard. Pulses:      Dorsalis pedis pulses are 2+ on the right side, and 2+ on the left side.  Respiratory: No respiratory distress. He has decreased breath sounds in the right lower field and the left lower field. He has no wheezes. He has no rhonchi. He has no rales.  GI: Soft. Bowel sounds are normal. There is no tenderness.  Musculoskeletal:       Right ankle: He exhibits swelling.        Left ankle: He exhibits swelling.  Lymphadenopathy:    He has no cervical adenopathy.  Neurological: He is alert and oriented to person, place, and time. No cranial nerve deficit.  Skin: Skin is warm. Nails show no clubbing.  Chronic lower extremity discoloration.  Weeping edema left leg.  Psychiatric: He has a normal mood and affect.      Data Reviewed: Basic Metabolic Panel: Recent Labs  Lab 08/19/17 1339 08/20/17 0527 08/21/17 0822 08/22/17 0648 08/22/17 2057  NA 137 138 135 134* 136  K 4.4 5.3* 5.2* 4.8 4.6  CL 94* 97* 91* 93* 93*  CO2 35* 35* 36* 33* 33*  GLUCOSE 146* 137* 142* 130* 123*  BUN 48* 54* 59* 64* 61*  CREATININE 1.37* 1.50* 1.61* 1.66* 1.42*  CALCIUM 9.0 8.8* 8.9 8.7* 8.7*  MG  --   --  2.4  --   --   PHOS  --  4.7*  --  5.4*  --    Liver Function Tests: Recent Labs  Lab 08/20/17 0527 08/22/17 0648  ALBUMIN 3.3* 3.5   CBC: Recent Labs  Lab 08/18/17 1206 08/19/17 1339 08/20/17 0527 08/21/17 0822 08/22/17 2057  WBC 9.2 10.1 14.2* 11.0* 14.5*  HGB 12.0* 12.1* 11.7* 12.0* 14.3  HCT 35.8* 37.0* 35.7* 37.6* 44.4  MCV 94.0 95.7 95.3 96.1 96.1  PLT 164 170 172 176 168   Cardiac Enzymes: Recent Labs  Lab 08/18/17 1206 08/22/17 2057 08/23/17 0259  TROPONINI <0.03 0.05* 0.05*   BNP (last 3 results) Recent Labs    07/13/17 1416 08/16/17 1533 08/18/17 1206  BNP 312.0* 321.0* 273.0*      Recent Results (from the past 240 hour(s))  Culture, blood (routine x 2) Call MD if unable to obtain prior to antibiotics being given     Status: None (Preliminary result)   Collection Time: 08/23/17  2:58 AM  Result Value Ref Range Status   Specimen Description BLOOD LEFT ANTECUBITAL  Final   Special Requests   Final    BOTTLES DRAWN AEROBIC AND ANAEROBIC Blood Culture adequate volume   Culture   Final    NO GROWTH 1 DAY Performed at [facility], [address] [place], [place] [address]    Report Status PENDING  Incomplete   Culture, blood (routine x 2) Call MD if unable to obtain prior to antibiotics being given     Status: None (Preliminary result)   Collection Time: 08/23/17  3:04 AM  Result Value Ref Range Status   Specimen Description BLOOD RIGHT ANTECUBITAL  Final   Special Requests   Final    BOTTLES DRAWN AEROBIC AND ANAEROBIC Blood Culture adequate volume   Culture   Final    NO GROWTH 1 DAY Performed at [facility], [address], [place], [place] [address]    Report Status PENDING  Incomplete  MRSA PCR Screening     Status: None   Collection Time: 08/23/17  1:30 PM  Result Value Ref Range Status   MRSA by PCR NEGATIVE NEGATIVE Final    Comment:        The GeneXpert MRSA Assay (FDA approved for NASAL specimens only), is one component of a comprehensive MRSA colonization surveillance program. It is not intended to diagnose MRSA infection nor to guide or monitor treatment for MRSA infections. Performed at [facility], [address] [place], [place] [address]      Studies: Dg Chest [facility] 1 View  Result Date: 08/22/2017 CLINICAL DATA:  Shortness of breath EXAM: PORTABLE CHEST 1 VIEW COMPARISON:  August 21, 2017 FINDINGS: There is moderate interstitial edema. There is patchy consolidation in the left base. There is a minimal left pleural effusion. There is cardiomegaly with pulmonary venous hypertension. There is aortic atherosclerosis. Patient is status post coronary artery bypass grafting. Multiple sternal wires are fractured, a stable finding. No bone lesions. IMPRESSION: Cardiomegaly with pulmonary venous hypertension. Interstitial edema. Suspect a degree of congestive heart failure. Probable superimposed pneumonia left base. There is aortic atherosclerosis. Aortic Atherosclerosis (ICD10-I70.0). Electronically Signed   By: Lowella Grip III M.D.   On: 08/22/2017 21:33    Scheduled Meds: . allopurinol  100 mg Oral Daily  . apixaban  2.5 mg Oral BID  . atorvastatin   80 mg Oral QHS  . furosemide  40 mg Intravenous BID  . gabapentin  300 mg Oral BID  . ipratropium  0.5 mg Nebulization Q6H  . levalbuterol  1.25 mg Nebulization Q6H  . metoprolol tartrate  50 mg Oral BID  . spironolactone  25 mg Oral QODAY  . traZODone  50 mg Oral QHS  . umeclidinium-vilanterol  1 puff Inhalation Daily   Continuous Infusions: . [START ON 08/25/2017] ceFEPime (MAXIPIME) IV      Assessment/Plan:  1. Acute on chronic hypoxic respiratory failure.  Patient must wear his oxygen 24/7. 2. Left lower lobe pneumonia.  MRSA PCR negative so I  discontinued the vancomycin.  On cefepime at this point.  Previous right lower lobe pneumonia on last chest x-ray. 3. Acute on chronic diastolic congestive heart failure.  Continue diuresis with IV Lasix 40 mg IV twice daily. 4. COPD.  Continue nebulizers and inhalers. 5. Chronic kidney disease stage III.  Watch with diuresis. 6. Chronic atrial fibrillation on Eliquis for anticoagulation and metoprolol for rate control 7. Hyperlipidemia unspecified on atorvastatin 8. Insomnia trial of trazodone at night.  Already on melatonin  Code Status:     Code Status Orders  (From admission, onward)        Start     Ordered   08/23/17 0211  Full code  Continuous     08/23/17 0210    Code Status History    Date Active Date Inactive Code Status Order ID Comments User Context   08/18/2017 1828 08/22/2017 1725 Full Code [id number]  Gladstone Lighter, MD Inpatient   06/04/2017 1414 06/10/2017 1744 DNR [id number]  Hillary Bow, MD Inpatient   06/04/2017 1244 06/04/2017 1414 Full Code [id number]  Hillary Bow, MD Inpatient   12/23/2016 0806 12/27/2016 1617 Full Code [id number]  Lance Coon, MD Inpatient   12/23/2016 0714 12/23/2016 0806 Full Code [id number]  Hillary Bow, MD ED   12/30/2015 1429 12/30/2015 1859 Full Code [id number]  Dew, Erskine Squibb, MD Inpatient    Advance Directive Documentation     Most Recent Value  Type of Advance Directive  Living  will  Pre-existing out of facility DNR order (yellow form or pink MOST form)  -  "MOST" Form in Place?  -     Family Communication: Wife yesterday Disposition Plan: Not sure when to go home since he came right back to the hospital  Consultants:  Cardiology  Antibiotics:  Cefepime  Time spent: 28 minutes  [place]

## 2017-08-24 NOTE — Progress Notes (Signed)
Patient/family insisting on walking. Patient ambulated lap around unit with RN, O2, minimal assistance and personal walker. Patient became short of breath and returned to room. His personal pulse oximeter was reading less than 70%. RN placed patient on non-rebreather for 1-2 minutes before NT brought a hospital pulse oximeter which showed patient to be at 97%. Patient not in distress, weaned back down to his previous 3L at 96%. Family at bedside. Unsure of accuracy of personal pulse ox reading. Patient resting in chair.

## 2017-08-24 NOTE — Progress Notes (Signed)
PHARMACY NOTE:  ANTIMICROBIAL RENAL DOSAGE ADJUSTMENT  Current antimicrobial regimen includes a mismatch between antimicrobial dosage and estimated renal function.  As per policy approved by the Pharmacy & Therapeutics and Medical Executive Committees, the antimicrobial dosage will be adjusted accordingly.  Current antimicrobial dosage:  Cefepime 1g q 8hr  Indication: PNA  Renal Function:  Estimated Creatinine Clearance: 48.2 mL/min (A) (by C-G formula based on SCr of 1.42 mg/dL (H)). []      On intermittent HD, scheduled: []      On CRRT    Antimicrobial dosage has been changed to:  Cefepime 2g q 24hr  Additional comments:   Thank you for allowing pharmacy to be a part of this patient's care.  Ramond Dial, Pharm.D, BCPS Clinical Pharmacist 08/24/2017 10:55 AM

## 2017-08-25 LAB — BASIC METABOLIC PANEL
ANION GAP: 4 — AB (ref 5–15)
BUN: 66 mg/dL — ABNORMAL HIGH (ref 8–23)
CALCIUM: 8.5 mg/dL — AB (ref 8.9–10.3)
CHLORIDE: 94 mmol/L — AB (ref 98–111)
CO2: 36 mmol/L — AB (ref 22–32)
Creatinine, Ser: 1.43 mg/dL — ABNORMAL HIGH (ref 0.61–1.24)
GFR calc non Af Amer: 42 mL/min — ABNORMAL LOW (ref >60)
GFR, EST AFRICAN AMERICAN: 49 mL/min — AB (ref >60)
Glucose, Bld: 180 mg/dL — ABNORMAL HIGH (ref 70–99)
Potassium: 4.8 mmol/L (ref 3.5–5.1)
SODIUM: 134 mmol/L — AB (ref 135–145)

## 2017-08-25 LAB — PROCALCITONIN: Procalcitonin: 0.18 ng/mL

## 2017-08-25 MED ORDER — METHYLPREDNISOLONE SODIUM SUCC 40 MG IJ SOLR
40.0000 mg | Freq: Two times a day (BID) | INTRAMUSCULAR | Status: DC
Start: 2017-08-25 — End: 2017-09-02
  Administered 2017-08-25 – 2017-09-01 (×15): 40 mg via INTRAVENOUS
  Filled 2017-08-25 (×15): qty 1

## 2017-08-25 NOTE — Progress Notes (Signed)
Spoke with pt RN Marcene Brawn, she will call when pt is ready for Cpap.

## 2017-08-25 NOTE — Progress Notes (Signed)
Family brought in pt home CPAP unit with hopes of being more compliant with use. Pt has agreed to try it.

## 2017-08-25 NOTE — Progress Notes (Signed)
Patient ID: Norman Morales, male   DOB: 28-Oct-1926, 84 y.o.   MRN: [id number]  [place] PROGRESS NOTE  Norman Morales:[id number] DOB: 10-21-1926 DOA: 08/22/2017 PCP: Adin Hector, MD  HPI/Subjective: Patient feels okay.  Hoping to go home every day.  Still with some cough and shortness of breath.  Urinating well.  Pulse ox yesterday dropped down into the 70s with ambulation.  Objective: Vitals:   08/25/17 0930 08/25/17 1332  BP:    Pulse: 66   Resp:    Temp:    SpO2:  96%    Filed Weights   08/23/17 0221 08/24/17 0332 08/25/17 0552  Weight: 124.9 kg (275 lb 6.4 oz) 125.1 kg (275 lb 11.2 oz) 127.7 kg (281 lb 9.6 oz)    ROS: Review of Systems  Constitutional: Negative for chills and fever.  Eyes: Negative for blurred vision.  Respiratory: Positive for cough and shortness of breath.   Cardiovascular: Negative for chest pain.  Gastrointestinal: Negative for abdominal pain, constipation, diarrhea, nausea and vomiting.  Genitourinary: Negative for dysuria.  Musculoskeletal: Negative for joint pain.  Neurological: Negative for dizziness and headaches.   Exam: Physical Exam  Constitutional: He is oriented to person, place, and time.  HENT:  Nose: No mucosal edema.  Mouth/Throat: No oropharyngeal exudate or posterior oropharyngeal edema.  Eyes: Pupils are equal, round, and reactive to light. Conjunctivae, EOM and lids are normal.  Neck: No JVD present. Carotid bruit is not present. No edema present. No thyroid mass and no thyromegaly present.  Cardiovascular: S1 normal and S2 normal. Exam reveals no gallop.  No murmur heard. Pulses:      Dorsalis pedis pulses are 2+ on the right side, and 2+ on the left side.  Respiratory: No respiratory distress. He has decreased breath sounds in the right lower field and the left lower field. He has wheezes in the right middle field and the left middle field. He has rhonchi in the right lower field and the left lower field. He  has no rales.  GI: Soft. Bowel sounds are normal. There is no tenderness.  Musculoskeletal:       Right ankle: He exhibits swelling.       Left ankle: He exhibits swelling.  Lymphadenopathy:    He has no cervical adenopathy.  Neurological: He is alert and oriented to person, place, and time. No cranial nerve deficit.  Skin: Skin is warm. Nails show no clubbing.  Chronic lower extremity discoloration.  Weeping edema left leg.  Psychiatric: He has a normal mood and affect.      Data Reviewed: Basic Metabolic Panel: Recent Labs  Lab 08/20/17 0527 08/21/17 0822 08/22/17 0648 08/22/17 2057 08/25/17 0328  NA 138 135 134* 136 134*  K 5.3* 5.2* 4.8 4.6 4.8  CL 97* 91* 93* 93* 94*  CO2 35* 36* 33* 33* 36*  GLUCOSE 137* 142* 130* 123* 180*  BUN 54* 59* 64* 61* 66*  CREATININE 1.50* 1.61* 1.66* 1.42* 1.43*  CALCIUM 8.8* 8.9 8.7* 8.7* 8.5*  MG  --  2.4  --   --   --   PHOS 4.7*  --  5.4*  --   --    Liver Function Tests: Recent Labs  Lab 08/20/17 0527 08/22/17 0648  ALBUMIN 3.3* 3.5   CBC: Recent Labs  Lab 08/19/17 1339 08/20/17 0527 08/21/17 0822 08/22/17 2057  WBC 10.1 14.2* 11.0* 14.5*  HGB 12.1* 11.7* 12.0* 14.3  HCT 37.0* 35.7* 37.6* 44.4  MCV 95.7 95.3 96.1 96.1  PLT 170 172 176 168   Cardiac Enzymes: Recent Labs  Lab 08/22/17 2057 08/23/17 0259  TROPONINI 0.05* 0.05*   BNP (last 3 results) Recent Labs    07/13/17 1416 08/16/17 1533 08/18/17 1206  BNP 312.0* 321.0* 273.0*      Recent Results (from the past 240 hour(s))  Culture, blood (routine x 2) Call MD if unable to obtain prior to antibiotics being given     Status: None (Preliminary result)   Collection Time: 08/23/17  2:58 AM  Result Value Ref Range Status   Specimen Description BLOOD LEFT ANTECUBITAL  Final   Special Requests   Final    BOTTLES DRAWN AEROBIC AND ANAEROBIC Blood Culture adequate volume   Culture   Final    NO GROWTH 2 DAYS Performed at [facility], [address], [place], [place] [address]    Report Status PENDING  Incomplete  Culture, blood (routine x 2) Call MD if unable to obtain prior to antibiotics being given     Status: None (Preliminary result)   Collection Time: 08/23/17  3:04 AM  Result Value Ref Range Status   Specimen Description BLOOD RIGHT ANTECUBITAL  Final   Special Requests   Final    BOTTLES DRAWN AEROBIC AND ANAEROBIC Blood Culture adequate volume   Culture   Final    NO GROWTH 2 DAYS Performed at [facility], [address], [place], [place] [address]    Report Status PENDING  Incomplete  MRSA PCR Screening     Status: None   Collection Time: 08/23/17  1:30 PM  Result Value Ref Range Status   MRSA by PCR NEGATIVE NEGATIVE Final    Comment:        The GeneXpert MRSA Assay (FDA approved for NASAL specimens only), is one component of a comprehensive MRSA colonization surveillance program. It is not intended to diagnose MRSA infection nor to guide or monitor treatment for MRSA infections. Performed at [facility], [address] [place], [place] [address]       Scheduled Meds: . allopurinol  100 mg Oral Daily  . apixaban  2.5 mg Oral BID  . atorvastatin  80 mg Oral QHS  . furosemide  40 mg Intravenous BID  . gabapentin  300 mg Oral BID  . ipratropium  0.5 mg Nebulization Q6H  . levalbuterol  1.25 mg Nebulization Q6H  . methylPREDNISolone (SOLU-MEDROL) injection  40 mg Intravenous Q12H  . metoprolol tartrate  50 mg Oral BID  . spironolactone  25 mg Oral QODAY  . traZODone  50 mg Oral QHS  . umeclidinium-vilanterol  1 puff Inhalation Daily   Continuous Infusions: . ceFEPime (MAXIPIME) IV Stopped (08/25/17 6546)    Assessment/Plan:  1. Acute on chronic hypoxic respiratory failure.  Patient must wear his oxygen 24/7.  Would like to see the pulse ox better with ambulation prior to disposition. 2. COPD exacerbation restart Solu-Medrol 40 mg IV twice daily.  Continue nebulizer  treatments. 3. Left lower lobe pneumonia.  MRSA PCR negative so I discontinued the vancomycin.  On cefepime at this point.  Previous right lower lobe pneumonia on last chest x-ray. 4. Acute on chronic diastolic congestive heart failure.  Continue diuresis with IV Lasix 40 mg IV twice daily. 5. Chronic kidney disease stage III.  Watch with diuresis. 6. Chronic atrial fibrillation on Eliquis for anticoagulation and metoprolol for rate control 7. Hyperlipidemia unspecified on atorvastatin 8. Insomnia trial of  trazodone at night.  Already on melatonin  Code Status:     Code Status Orders  (From admission, onward)        Start     Ordered   08/23/17 0211  Full code  Continuous     08/23/17 0210    Code Status History    Date Active Date Inactive Code Status Order ID Comments User Context   08/18/2017 1828 08/22/2017 1725 Full Code [id number]  Gladstone Lighter, MD Inpatient   06/04/2017 1414 06/10/2017 1744 DNR [id number]  Hillary Bow, MD Inpatient   06/04/2017 1244 06/04/2017 1414 Full Code [id number]  Hillary Bow, MD Inpatient   12/23/2016 0806 12/27/2016 1617 Full Code [id number]  Lance Coon, MD Inpatient   12/23/2016 0714 12/23/2016 0806 Full Code [id number]  Hillary Bow, MD ED   12/30/2015 1429 12/30/2015 1859 Full Code [id number]  Dew, Erskine Squibb, MD Inpatient    Advance Directive Documentation     Most Recent Value  Type of Advance Directive  Living will  Pre-existing out of facility DNR order (yellow form or pink MOST form)  -  "MOST" Form in Place?  -     Family Communication: Daughter at the bedside Disposition Plan: Home once breathing a little bit better  Consultants:  Cardiology  Antibiotics:  Cefepime  Time spent: 27 minutes  [place]

## 2017-08-26 LAB — HEMOGLOBIN A1C
Hgb A1c MFr Bld: 6.7 % — ABNORMAL HIGH (ref 4.8–5.6)
Mean Plasma Glucose: 145.59 mg/dL

## 2017-08-26 LAB — BASIC METABOLIC PANEL
Anion gap: 8 (ref 5–15)
Anion gap: 7 (ref 5–15)
BUN: 52 mg/dL — AB (ref 8–23)
BUN: 53 mg/dL — AB (ref 8–23)
CALCIUM: 8.8 mg/dL — AB (ref 8.9–10.3)
CALCIUM: 8.7 mg/dL — AB (ref 8.9–10.3)
CO2: 37 mmol/L — AB (ref 22–32)
CO2: 36 mmol/L — AB (ref 22–32)
CREATININE: 1.23 mg/dL (ref 0.61–1.24)
CREATININE: 1.3 mg/dL — AB (ref 0.61–1.24)
Chloride: 94 mmol/L — ABNORMAL LOW (ref 98–111)
Chloride: 93 mmol/L — ABNORMAL LOW (ref 98–111)
GFR calc non Af Amer: 50 mL/min — ABNORMAL LOW (ref >60)
GFR calc non Af Amer: 47 mL/min — ABNORMAL LOW (ref >60)
GFR, EST AFRICAN AMERICAN: 58 mL/min — AB (ref >60)
GFR, EST AFRICAN AMERICAN: 54 mL/min — AB (ref >60)
Glucose, Bld: 185 mg/dL — ABNORMAL HIGH (ref 70–99)
Glucose, Bld: 323 mg/dL — ABNORMAL HIGH (ref 70–99)
Potassium: 5.7 mmol/L — ABNORMAL HIGH (ref 3.5–5.1)
Potassium: 5 mmol/L (ref 3.5–5.1)
SODIUM: 139 mmol/L (ref 135–145)
Sodium: 136 mmol/L (ref 135–145)

## 2017-08-26 MED ORDER — GABAPENTIN 300 MG PO CAPS
600.0000 mg | ORAL_CAPSULE | Freq: Every day | ORAL | Status: DC
  Administered 2017-08-26 – 2017-08-28 (×3): 600 mg via ORAL
  Filled 2017-08-26 (×4): qty 2

## 2017-08-26 MED ORDER — GABAPENTIN 300 MG PO CAPS
300.0000 mg | ORAL_CAPSULE | Freq: Every morning | ORAL | Status: DC
  Administered 2017-08-27 – 2017-08-29 (×3): 300 mg via ORAL
  Filled 2017-08-26 (×3): qty 1

## 2017-08-26 NOTE — Progress Notes (Signed)
Notified MD Marcille Blanco of AM potassium level 5.7. Per MD, give 0800 dose of 40mg  IV lasix now. Nursing staff will continue to monitor for any changes in patient status. Earleen Reaper, RN

## 2017-08-26 NOTE — Progress Notes (Signed)
Placed pt on home Cpap unit with nasal mask. Pt has 4L [place] bleed in. Pt unit plugged into red outlet and free of any visible damage. [employer] called for further check.

## 2017-08-26 NOTE — Care Management Important Message (Signed)
Copy of signed IM left with patient in room.  

## 2017-08-26 NOTE — Progress Notes (Signed)
Patient ID: Norman Morales, male   DOB: [birth year], 84 y.o.   MRN: [id number]  [facility] PROGRESS NOTE  Norman Morales ZHG:[id number] DOB: 21-May-1926 DOA: 08/22/2017 PCP: Adin Hector, MD  HPI/Subjective: Patient has some cough and shortness of breath.  Hypoxia on exertion.  On oxygen by nasal cannula 3 L.  Objective: Vitals:   08/26/17 0803 08/26/17 1617  BP: (!) 169/94 (!) 156/73  Pulse: 69 75  Resp: 16   Temp: (!) 97.4 F (36.3 C) 97.6 F (36.4 C)  SpO2: 91% 94%    Filed Weights   08/24/17 0332 08/25/17 0552 08/26/17 0500  Weight: 275 lb 11.2 oz (125.1 kg) 281 lb 9.6 oz (127.7 kg) 280 lb 9.6 oz (127.3 kg)    ROS: Review of Systems  Constitutional: Negative for chills and fever.  Eyes: Negative for blurred vision.  Respiratory: Positive for cough and shortness of breath.   Cardiovascular: Negative for chest pain.  Gastrointestinal: Negative for abdominal pain, constipation, diarrhea, nausea and vomiting.  Genitourinary: Negative for dysuria.  Musculoskeletal: Negative for joint pain.  Neurological: Negative for dizziness and headaches.   Exam: Physical Exam  Constitutional: He is oriented to person, place, and time.  HENT:  Nose: No mucosal edema.  Mouth/Throat: No oropharyngeal exudate or posterior oropharyngeal edema.  Eyes: Pupils are equal, round, and reactive to light. Conjunctivae, EOM and lids are normal.  Neck: No JVD present. Carotid bruit is not present. No edema present. No thyroid mass and no thyromegaly present.  Cardiovascular: S1 normal and S2 normal. Exam reveals no gallop.  No murmur heard. Pulses:      Dorsalis pedis pulses are 2+ on the right side, and 2+ on the left side.  Respiratory: Effort normal. No respiratory distress. He has decreased breath sounds. He has no wheezes. He has no rhonchi. He has rales.  GI: Soft. Bowel sounds are normal. There is no tenderness.  Musculoskeletal: He exhibits edema.       Right ankle: He exhibits  swelling.       Left ankle: He exhibits swelling.  Lymphadenopathy:    He has no cervical adenopathy.  Neurological: He is alert and oriented to person, place, and time. No cranial nerve deficit.  Skin: Skin is warm. Nails show no clubbing.  Chronic lower extremity discoloration.  Weeping edema left leg.  Psychiatric: He has a normal mood and affect.      Data Reviewed: Basic Metabolic Panel: Recent Labs  Lab 08/20/17 0527 08/21/17 1962 08/22/17 2297 08/22/17 2057 08/25/17 0328 08/26/17 0428 08/26/17 1106  NA 138 135 134* 136 134* 139 136  K 5.3* 5.2* 4.8 4.6 4.8 5.7* 5.0  CL 97* 91* 93* 93* 94* 94* 93*  CO2 35* 36* 33* 33* 36* 37* 36*  GLUCOSE 137* 142* 130* 123* 180* 185* 323*  BUN 54* 59* 64* 61* 66* 52* 53*  CREATININE 1.50* 1.61* 1.66* 1.42* 1.43* 1.23 1.30*  CALCIUM 8.8* 8.9 8.7* 8.7* 8.5* 8.8* 8.7*  MG  --  2.4  --   --   --   --   --   PHOS 4.7*  --  5.4*  --   --   --   --    Liver Function Tests: Recent Labs  Lab 08/20/17 0527 08/22/17 0648  ALBUMIN 3.3* 3.5   CBC: Recent Labs  Lab 08/20/17 0527 08/21/17 0822 08/22/17 2057  WBC 14.2* 11.0* 14.5*  HGB 11.7* 12.0* 14.3  HCT 35.7* 37.6* 44.4  MCV  95.3 96.1 96.1  PLT 172 176 168   Cardiac Enzymes: Recent Labs  Lab 08/22/17 2057 08/23/17 0259  TROPONINI 0.05* 0.05*   BNP (last 3 results) Recent Labs    07/13/17 1416 08/16/17 1533 08/18/17 1206  BNP 312.0* 321.0* 273.0*      Recent Results (from the past 240 hour(s))  Culture, blood (routine x 2) Call MD if unable to obtain prior to antibiotics being given     Status: None (Preliminary result)   Collection Time: 08/23/17  2:58 AM  Result Value Ref Range Status   Specimen Description BLOOD LEFT ANTECUBITAL  Final   Special Requests   Final    BOTTLES DRAWN AEROBIC AND ANAEROBIC Blood Culture adequate volume   Culture   Final    NO GROWTH 3 DAYS Performed at [facility], [address], [place], [place] [address]    Report  Status PENDING  Incomplete  Culture, blood (routine x 2) Call MD if unable to obtain prior to antibiotics being given     Status: None (Preliminary result)   Collection Time: 08/23/17  3:04 AM  Result Value Ref Range Status   Specimen Description BLOOD RIGHT ANTECUBITAL  Final   Special Requests   Final    BOTTLES DRAWN AEROBIC AND ANAEROBIC Blood Culture adequate volume   Culture   Final    NO GROWTH 3 DAYS Performed at [facility], [address] [place], [place] [address]    Report Status PENDING  Incomplete  MRSA PCR Screening     Status: None   Collection Time: 08/23/17  1:30 PM  Result Value Ref Range Status   MRSA by PCR NEGATIVE NEGATIVE Final    Comment:        The GeneXpert MRSA Assay (FDA approved for NASAL specimens only), is one component of a comprehensive MRSA colonization surveillance program. It is not intended to diagnose MRSA infection nor to guide or monitor treatment for MRSA infections. Performed at [facility], [address] [place], [place] [address]       Scheduled Meds: . allopurinol  100 mg Oral Daily  . apixaban  2.5 mg Oral BID  . atorvastatin  80 mg Oral QHS  . furosemide  40 mg Intravenous BID  . gabapentin  300 mg Oral BID  . ipratropium  0.5 mg Nebulization Q6H  . levalbuterol  1.25 mg Nebulization Q6H  . methylPREDNISolone (SOLU-MEDROL) injection  40 mg Intravenous Q12H  . metoprolol tartrate  50 mg Oral BID  . traZODone  50 mg Oral QHS  . umeclidinium-vilanterol  1 puff Inhalation Daily   Continuous Infusions: . ceFEPime (MAXIPIME) IV Stopped (08/26/17 5093)    Assessment/Plan:  1. Acute on chronic hypoxic respiratory failure.  Patient must wear his oxygen 24/7.  Would like to see the pulse ox better with ambulation prior to disposition. 2. COPD exacerbation restarted Solu-Medrol 40 mg IV twice daily.  Continue nebulizer treatments. 3. Left lower lobe pneumonia.  MRSA PCR negative, discontinued the  vancomycin.  On cefepime at this point.  Previous right lower lobe pneumonia on last chest x-ray. 4. Acute on chronic diastolic congestive heart failure.  Continue diuresis with IV Lasix 40 mg IV twice daily. 5. Chronic kidney disease stage III.  Watch with diuresis. 6. Chronic atrial fibrillation on Eliquis for anticoagulation and metoprolol for rate control 7. Hyperlipidemia unspecified on atorvastatin 8. Insomnia trial of trazodone at night.  Already on melatonin  Hyperkalemia.  Improved with IV  Lasix. Code Status:     Code Status Orders  (From admission, onward)        Start     Ordered   08/23/17 0211  Full code  Continuous     08/23/17 0210    Code Status History    Date Active Date Inactive Code Status Order ID Comments User Context   08/18/2017 1828 08/22/2017 1725 Full Code [id number]  Gladstone Lighter, MD Inpatient   06/04/2017 1414 06/10/2017 1744 DNR [id number]  Hillary Bow, MD Inpatient   06/04/2017 1244 06/04/2017 1414 Full Code [id number]  Hillary Bow, MD Inpatient   12/23/2016 0806 12/27/2016 1617 Full Code [id number]  Lance Coon, MD Inpatient   12/23/2016 0714 12/23/2016 0806 Full Code [id number]  Hillary Bow, MD ED   12/30/2015 1429 12/30/2015 1859 Full Code [id number]  Dew, Erskine Squibb, MD Inpatient    Advance Directive Documentation     Most Recent Value  Type of Advance Directive  Living will  Pre-existing out of facility DNR order (yellow form or pink MOST form)  -  "MOST" Form in Place?  -     Family Communication: His wife and son at the bedside Disposition Plan: Home with HHPT in 2 days.  Consultants:  Cardiology  Antibiotics:  Cefepime  Time spent: 46 minutes  [place]

## 2017-08-26 NOTE — Progress Notes (Signed)
PT Cancellation Note  Patient Details Name: YAHSIR WICKENS MRN: [id number] DOB: [birth year]   Cancelled Treatment:    Reason Eval/Treat Not Completed: Medical issues which prohibited therapy; Ka 5.7 and trending up falling outside guidelines for participation with PT services.  Will attempt to see pt at a future date as medically appropriate.     Heloise Beecham Silvestre Mines PT, DPT 08/26/17, 11:13 AM

## 2017-08-26 NOTE — Progress Notes (Addendum)
Pt states that he takes 1 tablet of gabapentin at night and 2 tablet of gabapentin at night. MAR only states 1 tablet twice a day. Notify Prime. Will continue to monitor.  Update 1110: Doctor Daimond called and states to change the order for gabepentin to 1 tablet in the morning and 2 tablet at night. Will continue to monitor.  Update 0441. PT BP was at 174/71 HR 60. Notify Prime. Will continue to monitor.  Update 0458: Doctor Jannifer Franklin ordered IV hydralazine 10 mg every 4 hours PRN. Will continue to monitor.  Update 0628: Pt BP was at 155/86 and HR 65/ Will continue to monitor.

## 2017-08-27 LAB — MAGNESIUM: Magnesium: 2.6 mg/dL — ABNORMAL HIGH (ref 1.7–2.4)

## 2017-08-27 LAB — BASIC METABOLIC PANEL
Anion gap: 6 (ref 5–15)
Anion gap: 5 (ref 5–15)
BUN: 57 mg/dL — AB (ref 8–23)
BUN: 56 mg/dL — ABNORMAL HIGH (ref 8–23)
CALCIUM: 8.7 mg/dL — AB (ref 8.9–10.3)
CALCIUM: 8.8 mg/dL — AB (ref 8.9–10.3)
CO2: 37 mmol/L — ABNORMAL HIGH (ref 22–32)
CO2: 38 mmol/L — AB (ref 22–32)
CREATININE: 1.38 mg/dL — AB (ref 0.61–1.24)
CREATININE: 1.26 mg/dL — AB (ref 0.61–1.24)
Chloride: 92 mmol/L — ABNORMAL LOW (ref 98–111)
Chloride: 93 mmol/L — ABNORMAL LOW (ref 98–111)
GFR calc Af Amer: 51 mL/min — ABNORMAL LOW (ref >60)
GFR calc Af Amer: 57 mL/min — ABNORMAL LOW (ref >60)
GFR calc non Af Amer: 49 mL/min — ABNORMAL LOW (ref >60)
GFR, EST NON AFRICAN AMERICAN: 44 mL/min — AB (ref >60)
GLUCOSE: 203 mg/dL — AB (ref 70–99)
GLUCOSE: 193 mg/dL — AB (ref 70–99)
Potassium: 5.6 mmol/L — ABNORMAL HIGH (ref 3.5–5.1)
Potassium: 5.3 mmol/L — ABNORMAL HIGH (ref 3.5–5.1)
Sodium: 135 mmol/L (ref 135–145)
Sodium: 136 mmol/L (ref 135–145)

## 2017-08-27 LAB — GLUCOSE, CAPILLARY: Glucose-Capillary: 302 mg/dL — ABNORMAL HIGH (ref 70–99)

## 2017-08-27 MED ORDER — INSULIN ASPART 100 UNIT/ML ~~LOC~~ SOLN
0.0000 [IU] | Freq: Three times a day (TID) | SUBCUTANEOUS | Status: DC
  Administered 2017-08-27: 4 [IU] via SUBCUTANEOUS
  Administered 2017-08-28: 3 [IU] via SUBCUTANEOUS
  Administered 2017-08-28 – 2017-08-29 (×3): 1 [IU] via SUBCUTANEOUS
  Administered 2017-08-29: 3 [IU] via SUBCUTANEOUS
  Administered 2017-08-30: 5 [IU] via SUBCUTANEOUS
  Administered 2017-08-30: 3 [IU] via SUBCUTANEOUS
  Administered 2017-08-30: 1 [IU] via SUBCUTANEOUS
  Administered 2017-08-31 – 2017-09-01 (×3): 2 [IU] via SUBCUTANEOUS
  Filled 2017-08-27 (×11): qty 1

## 2017-08-27 MED ORDER — HYDRALAZINE HCL 20 MG/ML IJ SOLN
10.0000 mg | INTRAMUSCULAR | Status: DC | PRN
  Administered 2017-08-27 – 2017-08-31 (×3): 10 mg via INTRAVENOUS
  Filled 2017-08-27 (×3): qty 1

## 2017-08-27 MED ORDER — DEXTROSE 50 % IV SOLN
25.0000 mL | Freq: Once | INTRAVENOUS | Status: AC
  Administered 2017-08-27: 25 mL via INTRAVENOUS
  Filled 2017-08-27: qty 50

## 2017-08-27 MED ORDER — SODIUM CHLORIDE 0.9 % IV SOLN
1.0000 g | Freq: Once | INTRAVENOUS | Status: AC
  Administered 2017-08-27: 1 g via INTRAVENOUS
  Filled 2017-08-27: qty 10

## 2017-08-27 MED ORDER — INSULIN ASPART 100 UNIT/ML IV SOLN
10.0000 [IU] | Freq: Once | INTRAVENOUS | Status: AC
  Administered 2017-08-27: 10 [IU] via INTRAVENOUS
  Filled 2017-08-27: qty 0.1

## 2017-08-27 MED ORDER — INSULIN ASPART 100 UNIT/ML ~~LOC~~ SOLN
0.0000 [IU] | Freq: Every day | SUBCUTANEOUS | Status: DC
  Administered 2017-08-27: 4 [IU] via SUBCUTANEOUS
  Administered 2017-08-29: 3 [IU] via SUBCUTANEOUS
  Filled 2017-08-27 (×2): qty 1

## 2017-08-27 NOTE — Plan of Care (Signed)
  Problem: Education: Goal: Knowledge of General Education information will improve Description Including pain rating scale, medication(s)/side effects and non-pharmacologic comfort measures Outcome: Progressing   Problem: Health Behavior/Discharge Planning: Goal: Ability to manage health-related needs will improve Outcome: Progressing   Problem: Clinical Measurements: Goal: Ability to maintain clinical measurements within normal limits will improve Outcome: Progressing   Problem: Activity: Goal: Risk for activity intolerance will decrease Outcome: Progressing   Problem: Safety: Goal: Ability to remain free from injury will improve Outcome: Progressing   Problem: Activity: Goal: Capacity to carry out activities will improve Outcome: Progressing

## 2017-08-27 NOTE — Progress Notes (Signed)
Patient ID: Norman Morales, male   DOB: 10-10-1926, 84 y.o.   MRN: [id number]  [place] PROGRESS NOTE  Norman Morales ZWC:[id number] DOB: 22-Sep-1926 DOA: 08/22/2017 PCP: Adin Hector, MD  HPI/Subjective: Patient has some cough and shortness of breath.  Still Hypoxia on exertion.  On oxygen by nasal cannula 4 L.  Objective: Vitals:   08/27/17 0828 08/27/17 1123  BP: (!) 166/84 (!) 155/79  Pulse: 75 84  Resp: 20   Temp: (!) 97.5 F (36.4 C)   SpO2: 93%     Filed Weights   08/25/17 0552 08/26/17 0500 08/27/17 0441  Weight: 281 lb 9.6 oz (127.7 kg) 280 lb 9.6 oz (127.3 kg) 281 lb 14.4 oz (127.9 kg)    ROS: Review of Systems  Constitutional: Positive for malaise/fatigue. Negative for chills and fever.  Eyes: Negative for blurred vision.  Respiratory: Positive for cough and shortness of breath. Negative for stridor.   Cardiovascular: Negative for chest pain.  Gastrointestinal: Negative for abdominal pain, constipation, diarrhea, nausea and vomiting.  Genitourinary: Negative for dysuria.  Musculoskeletal: Negative for joint pain.  Skin: Negative for rash.  Neurological: Negative for dizziness and headaches.  Psychiatric/Behavioral: Negative for depression. The patient is not nervous/anxious.    Exam: Physical Exam  Constitutional: He is oriented to person, place, and time.  HENT:  Nose: No mucosal edema.  Mouth/Throat: No oropharyngeal exudate or posterior oropharyngeal edema.  Eyes: Pupils are equal, round, and reactive to light. Conjunctivae, EOM and lids are normal.  Neck: No JVD present. Carotid bruit is not present. No edema present. No thyroid mass and no thyromegaly present.  Cardiovascular: S1 normal and S2 normal. Exam reveals no gallop.  No murmur heard. Pulses:      Dorsalis pedis pulses are 2+ on the right side, and 2+ on the left side.  Respiratory: Effort normal. No respiratory distress. He has decreased breath sounds. He has no wheezes. He has no  rhonchi. He has rales.  GI: Soft. Bowel sounds are normal. There is no tenderness.  Musculoskeletal: He exhibits edema.       Right ankle: He exhibits swelling.       Left ankle: He exhibits swelling.  Lymphadenopathy:    He has no cervical adenopathy.  Neurological: He is alert and oriented to person, place, and time. No cranial nerve deficit.  Skin: Skin is warm. Nails show no clubbing.  Chronic lower extremity discoloration.  Weeping edema left leg.  Psychiatric: He has a normal mood and affect.      Data Reviewed: Basic Metabolic Panel: Recent Labs  Lab 08/21/17 0822 08/22/17 0648  08/25/17 0328 08/26/17 0428 08/26/17 1106 08/27/17 0730 08/27/17 1445  NA 135 134*   < > 134* 139 136 135 136  K 5.2* 4.8   < > 4.8 5.7* 5.0 5.6* 5.3*  CL 91* 93*   < > 94* 94* 93* 92* 93*  CO2 36* 33*   < > 36* 37* 36* 37* 38*  GLUCOSE 142* 130*   < > 180* 185* 323* 203* 193*  BUN 59* 64*   < > 66* 52* 53* 57* 56*  CREATININE 1.61* 1.66*   < > 1.43* 1.23 1.30* 1.38* 1.26*  CALCIUM 8.9 8.7*   < > 8.5* 8.8* 8.7* 8.7* 8.8*  MG 2.4  --   --   --   --   --  2.6*  --   PHOS  --  5.4*  --   --   --   --   --   --    < > =  values in this interval not displayed.   Liver Function Tests: Recent Labs  Lab 08/22/17 0648  ALBUMIN 3.5   CBC: Recent Labs  Lab 08/21/17 0822 08/22/17 2057  WBC 11.0* 14.5*  HGB 12.0* 14.3  HCT 37.6* 44.4  MCV 96.1 96.1  PLT 176 168   Cardiac Enzymes: Recent Labs  Lab 08/22/17 2057 08/23/17 0259  TROPONINI 0.05* 0.05*   BNP (last 3 results) Recent Labs    07/13/17 1416 08/16/17 1533 08/18/17 1206  BNP 312.0* 321.0* 273.0*      Recent Results (from the past 240 hour(s))  Culture, blood (routine x 2) Call MD if unable to obtain prior to antibiotics being given     Status: None (Preliminary result)   Collection Time: 08/23/17  2:58 AM  Result Value Ref Range Status   Specimen Description BLOOD LEFT ANTECUBITAL  Final   Special Requests   Final     BOTTLES DRAWN AEROBIC AND ANAEROBIC Blood Culture adequate volume   Culture   Final    NO GROWTH 4 DAYS Performed at [facility], [address], [place], [place] [address]    Report Status PENDING  Incomplete  Culture, blood (routine x 2) Call MD if unable to obtain prior to antibiotics being given     Status: None (Preliminary result)   Collection Time: 08/23/17  3:04 AM  Result Value Ref Range Status   Specimen Description BLOOD RIGHT ANTECUBITAL  Final   Special Requests   Final    BOTTLES DRAWN AEROBIC AND ANAEROBIC Blood Culture adequate volume   Culture   Final    NO GROWTH 4 DAYS Performed at [facility], [address], [place], [place] [address]    Report Status PENDING  Incomplete  MRSA PCR Screening     Status: None   Collection Time: 08/23/17  1:30 PM  Result Value Ref Range Status   MRSA by PCR NEGATIVE NEGATIVE Final    Comment:        The GeneXpert MRSA Assay (FDA approved for NASAL specimens only), is one component of a comprehensive MRSA colonization surveillance program. It is not intended to diagnose MRSA infection nor to guide or monitor treatment for MRSA infections. Performed at [facility], [address] [place], [place] [address]       Scheduled Meds: . allopurinol  100 mg Oral Daily  . apixaban  2.5 mg Oral BID  . atorvastatin  80 mg Oral QHS  . furosemide  40 mg Intravenous BID  . gabapentin  300 mg Oral q morning - 10a  . gabapentin  600 mg Oral QHS  . insulin aspart  0-5 Units Subcutaneous QHS  . insulin aspart  0-9 Units Subcutaneous TID WC  . ipratropium  0.5 mg Nebulization Q6H  . levalbuterol  1.25 mg Nebulization Q6H  . methylPREDNISolone (SOLU-MEDROL) injection  40 mg Intravenous Q12H  . metoprolol tartrate  50 mg Oral BID  . traZODone  50 mg Oral QHS  . umeclidinium-vilanterol  1 puff Inhalation Daily   Continuous Infusions: . ceFEPime (MAXIPIME) IV Stopped (08/27/17 0539)     Assessment/Plan:  1. Acute on chronic hypoxic respiratory failure.  Patient must wear his oxygen 24/7.  CPAP at night. Would like to see the pulse ox better with ambulation prior to disposition. 2. COPD exacerbation restarted Solu-Medrol 40 mg IV twice daily.  Continue nebulizer treatments. 3. Left lower lobe pneumonia.  MRSA PCR negative, discontinued the vancomycin.  Continue cefepime.  Previous right lower lobe pneumonia on last chest x-ray. 4. Acute on chronic diastolic congestive heart failure.  Continue diuresis with IV Lasix 40 mg IV twice daily. 5. Chronic kidney disease stage III.  Watch with diuresis. 6. Chronic atrial fibrillation on Eliquis for anticoagulation and metoprolol for rate control 7. Hyperlipidemia unspecified on atorvastatin 8. Insomnia trial of trazodone at night.  Already on melatonin  Hyperkalemia.  Improved with IV Lasix. K5.6 today.  Patient was treated with calcium gluconate, D50 and NovoLog.  Repeat potassium is 5.3.  Follow-up BMP in a.m. Code Status:     Code Status Orders  (From admission, onward)        Start     Ordered   08/23/17 0211  Full code  Continuous     08/23/17 0210    Code Status History    Date Active Date Inactive Code Status Order ID Comments User Context   08/18/2017 1828 08/22/2017 1725 Full Code [id number]  Gladstone Lighter, MD Inpatient   06/04/2017 1414 06/10/2017 1744 DNR [id number]  Hillary Bow, MD Inpatient   06/04/2017 1244 06/04/2017 1414 Full Code [id number]  Hillary Bow, MD Inpatient   12/23/2016 0806 12/27/2016 1617 Full Code [id number]  Lance Coon, MD Inpatient   12/23/2016 0714 12/23/2016 0806 Full Code [id number]  Hillary Bow, MD ED   12/30/2015 1429 12/30/2015 1859 Full Code [id number]  Dew, Erskine Squibb, MD Inpatient    Advance Directive Documentation     Most Recent Value  Type of Advance Directive  Living will  Pre-existing out of facility DNR order (yellow form or pink MOST form)  -  "MOST" Form in Place?  -      Family Communication: His wife and son at the bedside Disposition Plan: Home with HHPT in 2-3 days.  Consultants:  Cardiology  Antibiotics:  Cefepime  Time spent: 46 minutes  [place]

## 2017-08-27 NOTE — Progress Notes (Signed)
Inpatient Diabetes Program Recommendations  AACE/ADA: New Consensus Statement on Inpatient Glycemic Control (2019)  Target Ranges:  Prepandial:   less than 140 mg/dL      Peak postprandial:   less than 180 mg/dL (1-2 hours)      Critically ill patients:  140 - 180 mg/dL   Results for NATHANIE, OTTLEY (MRN [id number]) as of 08/27/2017 10:01  Ref. Range 08/26/2017 11:06 08/27/2017 07:30  Glucose Latest Ref Range: 70 - 99 mg/dL 323 (H) 203 (H)   Review of Glycemic Control  Diabetes history: No Outpatient Diabetes medications: NA Current orders for Inpatient glycemic control: None  Inpatient Diabetes Program Recommendations: Correction (SSI): While inpatient and ordered steroids, please consider ordering CBGs with Novolog correction scale ACHS.  NOTE: Noted patient is ordered Solumedrol 40 mg Q12H which is causing hyperglycemia.  Thanks, Barnie Alderman, RN, MSN, CDE Diabetes Coordinator Inpatient Diabetes Program [phone] (Team Pager from 8am to 5pm)

## 2017-08-27 NOTE — Progress Notes (Signed)
PT Cancellation Note  Patient Details Name: Norman Morales MRN: [id number] DOB: 06-Oct-1926   Cancelled Treatment:     Chart reviewed. Pts potassium still elevated to 5.6. Plan appears to be to redraw labs this afternoon. PT will monitor and attempt session when pt is medically appropriate.  Yolonda Kida, SPT    Sri Clegg 08/27/2017, 8:42 AM

## 2017-08-27 NOTE — Progress Notes (Signed)
Placed pt on hospital unit Cpap. Pt stated his home unit Cpap did not work properly last night.

## 2017-08-28 ENCOUNTER — Inpatient Hospital Stay: Payer: Medicare Other

## 2017-08-28 LAB — CULTURE, BLOOD (ROUTINE X 2)
Culture: NO GROWTH
Culture: NO GROWTH
SPECIAL REQUESTS: ADEQUATE
Special Requests: ADEQUATE

## 2017-08-28 LAB — BASIC METABOLIC PANEL
Anion gap: 7 (ref 5–15)
BUN: 59 mg/dL — AB (ref 8–23)
CO2: 36 mmol/L — AB (ref 22–32)
Calcium: 8.8 mg/dL — ABNORMAL LOW (ref 8.9–10.3)
Chloride: 93 mmol/L — ABNORMAL LOW (ref 98–111)
Creatinine, Ser: 1.25 mg/dL — ABNORMAL HIGH (ref 0.61–1.24)
GFR, EST AFRICAN AMERICAN: 57 mL/min — AB (ref >60)
GFR, EST NON AFRICAN AMERICAN: 49 mL/min — AB (ref >60)
GLUCOSE: 146 mg/dL — AB (ref 70–99)
Potassium: 5.5 mmol/L — ABNORMAL HIGH (ref 3.5–5.1)
Sodium: 136 mmol/L (ref 135–145)

## 2017-08-28 LAB — PROCALCITONIN: Procalcitonin: <0.1 ng/mL

## 2017-08-28 LAB — TROPONIN I
Troponin I: 0.05 ng/mL (ref <0.03)
Troponin I: 0.05 ng/mL (ref <0.03)
Troponin I: 0.05 ng/mL (ref <0.03)

## 2017-08-28 LAB — GLUCOSE, CAPILLARY
GLUCOSE-CAPILLARY: 220 mg/dL — AB (ref 70–99)
GLUCOSE-CAPILLARY: 181 mg/dL — AB (ref 70–99)
Glucose-Capillary: 146 mg/dL — ABNORMAL HIGH (ref 70–99)

## 2017-08-28 MED ORDER — IPRATROPIUM BROMIDE 0.02 % IN SOLN
0.5000 mg | Freq: Four times a day (QID) | RESPIRATORY_TRACT | Status: DC | PRN
  Administered 2017-08-30: 0.5 mg via RESPIRATORY_TRACT
  Filled 2017-08-28: qty 2.5

## 2017-08-28 MED ORDER — GUAIFENESIN ER 600 MG PO TB12: 600.0000 mg | ORAL_TABLET | Freq: Two times a day (BID) | ORAL | Status: DC

## 2017-08-28 MED ORDER — HYDROCOD POLST-CPM POLST ER 10-8 MG/5ML PO SUER
5.0000 mL | Freq: Two times a day (BID) | ORAL | Status: DC
  Administered 2017-08-28 – 2017-09-01 (×8): 5 mL via ORAL
  Filled 2017-08-28 (×8): qty 5

## 2017-08-28 MED ORDER — ACETYLCYSTEINE 20 % IN SOLN
4.0000 mL | Freq: Two times a day (BID) | RESPIRATORY_TRACT | Status: DC | PRN
  Filled 2017-08-28: qty 4

## 2017-08-28 MED ORDER — BUDESONIDE 0.25 MG/2ML IN SUSP
0.2500 mg | Freq: Two times a day (BID) | RESPIRATORY_TRACT | Status: DC
  Filled 2017-08-28: qty 2

## 2017-08-28 MED ORDER — MORPHINE SULFATE (PF) 2 MG/ML IV SOLN
2.0000 mg | INTRAVENOUS | Status: DC | PRN
  Administered 2017-08-28: 2 mg via INTRAVENOUS
  Filled 2017-08-28: qty 1

## 2017-08-28 MED ORDER — ACETYLCYSTEINE 20 % IN SOLN
4.0000 mL | Freq: Two times a day (BID) | RESPIRATORY_TRACT | Status: DC
  Administered 2017-08-28: 4 mL via RESPIRATORY_TRACT
  Filled 2017-08-28 (×2): qty 4

## 2017-08-28 MED ORDER — BUDESONIDE 0.25 MG/2ML IN SUSP: 0.2500 mg | Freq: Two times a day (BID) | RESPIRATORY_TRACT | Status: DC | PRN

## 2017-08-28 MED ORDER — GUAIFENESIN ER 600 MG PO TB12
600.0000 mg | ORAL_TABLET | Freq: Two times a day (BID) | ORAL | Status: DC | PRN
  Administered 2017-08-29 – 2017-08-31 (×2): 600 mg via ORAL
  Filled 2017-08-28 (×2): qty 1

## 2017-08-28 MED ORDER — LEVALBUTEROL HCL 1.25 MG/0.5ML IN NEBU
1.2500 mg | INHALATION_SOLUTION | Freq: Four times a day (QID) | RESPIRATORY_TRACT | Status: DC | PRN
  Filled 2017-08-28: qty 0.5

## 2017-08-28 NOTE — Progress Notes (Addendum)
Pt was place on CPAP but saturation at 84-85 %. Pt was place back on oxygen and sating at 88-90%. Notify Prime. Will continue to monitor.   Pt complaining of 10 out 10 chest pain radiating all over the chest. Notify Prime. Will continue to monitor.  Update 0614: Doctor Daimond ordered morphine 2mg /ml every 4 hours PRN for severe pain, Troponin every 6 hours x3, EKG stat. Will continue to monitor.

## 2017-08-28 NOTE — Progress Notes (Signed)
Patient ID: Norman Morales, male   DOB: 1926-02-23, 84 y.o.   MRN: [id number]  [facility] PROGRESS NOTE  Norman Morales [id number]:[id number] DOB: 01-19-1927 DOA: 08/22/2017 PCP: Tama High III, MD  HPI/Subjective: She continues to have shortness of breath, having difficulty with coughing up stuff  Objective: Vitals:   08/28/17 0813 08/28/17 1327  BP: 136/78   Pulse: 61   Resp: 18   Temp: 97.7 F (36.5 C)   SpO2: 94% 92%    Filed Weights   08/26/17 0500 08/27/17 0441 08/28/17 0416  Weight: 127.3 kg (280 lb 9.6 oz) 127.9 kg (281 lb 14.4 oz) 126.6 kg (279 lb 3.2 oz)    ROS: Review of Systems  Constitutional: Positive for malaise/fatigue. Negative for chills and fever.  Eyes: Negative for blurred vision.  Respiratory: Positive for cough and shortness of breath. Negative for stridor.   Cardiovascular: Negative for chest pain.  Gastrointestinal: Negative for abdominal pain, constipation, diarrhea, nausea and vomiting.  Genitourinary: Negative for dysuria.  Musculoskeletal: Negative for joint pain.  Skin: Negative for rash.  Neurological: Negative for dizziness and headaches.  Psychiatric/Behavioral: Negative for depression. The patient is not nervous/anxious.    Exam: Physical Exam  Constitutional: He is oriented to person, place, and time.  HENT:  Nose: No mucosal edema.  Mouth/Throat: No oropharyngeal exudate or posterior oropharyngeal edema.  Eyes: Pupils are equal, round, and reactive to light. Conjunctivae, EOM and lids are normal.  Neck: No JVD present. Carotid bruit is not present. No edema present. No thyroid mass and no thyromegaly present.  Cardiovascular: S1 normal and S2 normal. Exam reveals no gallop.  No murmur heard. Pulses:      Dorsalis pedis pulses are 2+ on the right side, and 2+ on the left side.  Respiratory: Effort normal. No respiratory distress. He has decreased breath sounds. He has no wheezes. He has no rhonchi. He has rales.  GI: Soft. Bowel  sounds are normal. There is no tenderness.  Musculoskeletal: He exhibits edema.       Right ankle: He exhibits swelling.       Left ankle: He exhibits swelling.  Lymphadenopathy:    He has no cervical adenopathy.  Neurological: He is alert and oriented to person, place, and time. No cranial nerve deficit.  Skin: Skin is warm. Nails show no clubbing.  Chronic lower extremity discoloration.  Weeping edema left leg.  Psychiatric: He has a normal mood and affect.      Data Reviewed: Basic Metabolic Panel: Recent Labs  Lab 08/22/17 0648  08/26/17 0428 08/26/17 1106 08/27/17 0730 08/27/17 1445 08/28/17 0404  NA 134*   < > 139 136 135 136 136  K 4.8   < > 5.7* 5.0 5.6* 5.3* 5.5*  CL 93*   < > 94* 93* 92* 93* 93*  CO2 33*   < > 37* 36* 37* 38* 36*  GLUCOSE 130*   < > 185* 323* 203* 193* 146*  BUN 64*   < > 52* 53* 57* 56* 59*  CREATININE 1.66*   < > 1.23 1.30* 1.38* 1.26* 1.25*  CALCIUM 8.7*   < > 8.8* 8.7* 8.7* 8.8* 8.8*  MG  --   --   --   --  2.6*  --   --   PHOS 5.4*  --   --   --   --   --   --    < > = values in this interval not displayed.  Liver Function Tests: Recent Labs  Lab 08/22/17 0648  ALBUMIN 3.5   CBC: Recent Labs  Lab 08/22/17 2057  WBC 14.5*  HGB 14.3  HCT 44.4  MCV 96.1  PLT 168   Cardiac Enzymes: Recent Labs  Lab 08/22/17 2057 08/23/17 0259 08/28/17 0714 08/28/17 1022  TROPONINI 0.05* 0.05* 0.05* 0.05*   BNP (last 3 results) Recent Labs    07/13/17 1416 08/16/17 1533 08/18/17 1206  BNP 312.0* 321.0* 273.0*      Recent Results (from the past 240 hour(s))  Culture, blood (routine x 2) Call MD if unable to obtain prior to antibiotics being given     Status: None   Collection Time: 08/23/17  2:58 AM  Result Value Ref Range Status   Specimen Description BLOOD LEFT ANTECUBITAL  Final   Special Requests   Final    BOTTLES DRAWN AEROBIC AND ANAEROBIC Blood Culture adequate volume   Culture   Final    NO GROWTH 5 DAYS Performed at  [facility], [address] [place], [place] [address]    Report Status 08/28/2017 FINAL  Final  Culture, blood (routine x 2) Call MD if unable to obtain prior to antibiotics being given     Status: None   Collection Time: 08/23/17  3:04 AM  Result Value Ref Range Status   Specimen Description BLOOD RIGHT ANTECUBITAL  Final   Special Requests   Final    BOTTLES DRAWN AEROBIC AND ANAEROBIC Blood Culture adequate volume   Culture   Final    NO GROWTH 5 DAYS Performed at [facility], [address] [place], [place] [address]    Report Status 08/28/2017 FINAL  Final  MRSA PCR Screening     Status: None   Collection Time: 08/23/17  1:30 PM  Result Value Ref Range Status   MRSA by PCR NEGATIVE NEGATIVE Final    Comment:        The GeneXpert MRSA Assay (FDA approved for NASAL specimens only), is one component of a comprehensive MRSA colonization surveillance program. It is not intended to diagnose MRSA infection nor to guide or monitor treatment for MRSA infections. Performed at [facility], [address] [place], [place] [address]       Scheduled Meds: . acetylcysteine  4 mL Nebulization BID  . allopurinol  100 mg Oral Daily  . apixaban  2.5 mg Oral BID  . atorvastatin  80 mg Oral QHS  . budesonide (PULMICORT) nebulizer solution  0.25 mg Nebulization BID  . furosemide  40 mg Intravenous BID  . gabapentin  300 mg Oral q morning - 10a  . gabapentin  600 mg Oral QHS  . guaiFENesin  600 mg Oral BID  . insulin aspart  0-5 Units Subcutaneous QHS  . insulin aspart  0-9 Units Subcutaneous TID WC  . ipratropium  0.5 mg Nebulization Q6H  . levalbuterol  1.25 mg Nebulization Q6H  . methylPREDNISolone (SOLU-MEDROL) injection  40 mg Intravenous Q12H  . metoprolol tartrate  50 mg Oral BID  . traZODone  50 mg Oral QHS   Continuous Infusions: . ceFEPime (MAXIPIME) IV 2 g (08/28/17 0603)    Assessment/Plan:  1. Acute on chronic hypoxic  respiratory failure.  Patient must wear his oxygen 24/7.  CPAP at night.  I will order a CT to further evaluate his pulmonary symptoms 2. COPD exacerbation continue Solu-Medrol 40 mg IV twice daily.  Continue nebulizer treatments.  Add Pulmicort 3. Left lower lobe pneumonia.  MRSA PCR negative,  Continue cefepime.  Previous right lower lobe pneumonia on last chest x-ray. 4. Acute on chronic diastolic congestive heart failure.  Continue diuresis with IV Lasix 40 mg IV twice daily. 5. Chronic kidney disease stage III.  Watch with diuresis. 6. Chronic atrial fibrillation on Eliquis for anticoagulation and metoprolol for rate control 7. Hyperlipidemia unspecified on atorvastatin 8. Insomnia trial of trazodone at night.  Already on melatonin   Code Status:     Code Status Orders  (From admission, onward)        Start     Ordered   08/23/17 0211  Full code  Continuous     08/23/17 0210    Code Status History    Date Active Date Inactive Code Status Order ID Comments User Context   08/18/2017 1828 08/22/2017 1725 Full Code [id number]  Gladstone Lighter, MD Inpatient   06/04/2017 1414 06/10/2017 1744 DNR [id number]  Hillary Bow, MD Inpatient   06/04/2017 1244 06/04/2017 1414 Full Code [id number]  Hillary Bow, MD Inpatient   12/23/2016 0806 12/27/2016 1617 Full Code [id number]  Lance Coon, MD Inpatient   12/23/2016 0714 12/23/2016 0806 Full Code [id number]  Hillary Bow, MD ED   12/30/2015 1429 12/30/2015 1859 Full Code [id number]  Dew, Erskine Squibb, MD Inpatient    Advance Directive Documentation     Most Recent Value  Type of Advance Directive  Living will  Pre-existing out of facility DNR order (yellow form or pink MOST form)  -  "MOST" Form in Place?  -     Family Communication: His wife and son at the bedside Disposition Plan: Home with HHPT in 2-3 days.  Consultants:  Cardiology  Antibiotics:  Cefepime  Time spent: 35 minutes  [place]

## 2017-08-28 NOTE — Progress Notes (Addendum)
Pt is coughing up more than usual with blood on it as per wife. Went to assess pt and pt sitting upright and was coughing with some blood on it. VSS. Notify prime. Will continue to monitor.  Update 2030: Doctor Tressia Miners called and change the order from schedule for xopenex and atrovent to every 6 hours PRN. Added Tussionex 5 ml suspension every 12 hours  to help with cough. Will continue to monitor.   Update 2030: Doctor Jannifer Franklin ordered to change order from shedule for Mucomyst and Pulmicort PRN twice a day. Added Mucinex 12 hr tablet 600 two times daily PRN. Will continue to monitor.  Update 0400: pt have an episode of anxiety after an accident of peeing on himself. Notify prime. Will continue to monitor.  Update 0403: docotr [facility] an order for Lorazepam IV 0.5 mg every 6 hours PRN. Will continue to monitor.

## 2017-08-28 NOTE — Plan of Care (Signed)
  Problem: Education: Goal: Knowledge of General Education information will improve Description: Including pain rating scale, medication(s)/side effects and non-pharmacologic comfort measures Outcome: Progressing   Problem: Clinical Measurements: Goal: Will remain free from infection Outcome: Progressing   Problem: Safety: Goal: Ability to remain free from injury will improve Outcome: Progressing   

## 2017-08-28 NOTE — Progress Notes (Signed)
Pt had episode of harsh coughing and attendant hemoptysis. 02 sat dropped to high 70's. After this sats returned to low 90's. Family in room. Dr. Hermelinda Dellen. patel notified of this. He is going to phne in room to speak with pt's wife.

## 2017-08-28 NOTE — Progress Notes (Signed)
PT Cancellation Note  Patient Details Name: Norman Morales MRN: [id number] DOB: Sep 22, 1926   Cancelled Treatment:    Reason Eval/Treat Not Completed: Other (comment);Patient not medically ready(Chart reviewed: pt with new onset SOB, 10/10 CP and continued hypoxia. Pt now on IV morphine. Will hold until pt is mor emedically appropriate for PT evalaution. Will follow acutely. )  2:25 PM, 08/28/17 Etta Grandchild, PT, DPT Physical Therapist - [facility]  [phone] ([place])    Buccola,Allan C 08/28/2017, 2:25 PM

## 2017-08-29 DIAGNOSIS — Z9981 Dependence on supplemental oxygen: Secondary | ICD-10-CM

## 2017-08-29 DIAGNOSIS — J189 Pneumonia, unspecified organism: Secondary | ICD-10-CM

## 2017-08-29 DIAGNOSIS — G4733 Obstructive sleep apnea (adult) (pediatric): Secondary | ICD-10-CM

## 2017-08-29 DIAGNOSIS — I2510 Atherosclerotic heart disease of native coronary artery without angina pectoris: Secondary | ICD-10-CM

## 2017-08-29 DIAGNOSIS — N189 Chronic kidney disease, unspecified: Secondary | ICD-10-CM

## 2017-08-29 DIAGNOSIS — J9611 Chronic respiratory failure with hypoxia: Secondary | ICD-10-CM

## 2017-08-29 DIAGNOSIS — R918 Other nonspecific abnormal finding of lung field: Secondary | ICD-10-CM

## 2017-08-29 DIAGNOSIS — I509 Heart failure, unspecified: Secondary | ICD-10-CM

## 2017-08-29 DIAGNOSIS — I4891 Unspecified atrial fibrillation: Secondary | ICD-10-CM

## 2017-08-29 DIAGNOSIS — Z87891 Personal history of nicotine dependence: Secondary | ICD-10-CM

## 2017-08-29 DIAGNOSIS — C7972 Secondary malignant neoplasm of left adrenal gland: Secondary | ICD-10-CM

## 2017-08-29 LAB — BLOOD GAS, ARTERIAL
Acid-Base Excess: 10.3 mmol/L — ABNORMAL HIGH (ref 0.0–2.0)
Bicarbonate: 37.2 mmol/L — ABNORMAL HIGH (ref 20.0–28.0)
FIO2: 0.4
O2 Saturation: 88.7 %
Patient temperature: 37
pCO2 arterial: 60 mmHg — ABNORMAL HIGH (ref 32.0–48.0)
pH, Arterial: 7.4 (ref 7.350–7.450)
pO2, Arterial: 56 mmHg — ABNORMAL LOW (ref 83.0–108.0)

## 2017-08-29 LAB — GLUCOSE, CAPILLARY
Glucose-Capillary: 139 mg/dL — ABNORMAL HIGH (ref 70–99)
Glucose-Capillary: 248 mg/dL — ABNORMAL HIGH (ref 70–99)
Glucose-Capillary: 176 mg/dL — ABNORMAL HIGH (ref 70–99)
Glucose-Capillary: 253 mg/dL — ABNORMAL HIGH (ref 70–99)

## 2017-08-29 MED ORDER — LORAZEPAM 2 MG/ML IJ SOLN
0.5000 mg | Freq: Four times a day (QID) | INTRAMUSCULAR | Status: DC | PRN
  Administered 2017-08-29: 0.5 mg via INTRAVENOUS
  Filled 2017-08-29: qty 1

## 2017-08-29 MED ORDER — IOPAMIDOL (ISOVUE-300) INJECTION 61%: 30.0000 mL | Freq: Once | INTRAVENOUS | Status: DC | PRN

## 2017-08-29 MED ORDER — IOPAMIDOL (ISOVUE-300) INJECTION 61%
30.0000 mL | Freq: Once | INTRAVENOUS | Status: DC | PRN
Start: 2017-08-29 — End: 2017-09-02

## 2017-08-29 NOTE — Progress Notes (Addendum)
Patient ID: Norman Morales, male   DOB: 1926-09-13, 84 y.o.   MRN: [id number]  [place] [facility] PROGRESS NOTE  Norman Morales [id number] DOB: [birth year] DOA: 08/22/2017 PCP: Adin Hector, MD  HPI/Subjective: Patient coughed up blood yesterday.   Objective: Vitals:   08/29/17 0733 08/29/17 1346  BP: (!) 156/95   Pulse: (!) 57   Resp:    Temp: 98.5 F (36.9 C)   SpO2: 100% 94%    Filed Weights   08/27/17 0441 08/28/17 0416 08/29/17 0350  Weight: 127.9 kg (281 lb 14.4 oz) 126.6 kg (279 lb 3.2 oz) 126.5 kg (278 lb 12.8 oz)    ROS: Review of Systems  Constitutional: Positive for malaise/fatigue. Negative for chills and fever.  Eyes: Negative for blurred vision.  Respiratory: Positive for cough and shortness of breath. Negative for stridor.   Cardiovascular: Negative for chest pain.  Gastrointestinal: Negative for abdominal pain, constipation, diarrhea, nausea and vomiting.  Genitourinary: Negative for dysuria.  Musculoskeletal: Negative for joint pain.  Skin: Negative for rash.  Neurological: Negative for dizziness and headaches.  Psychiatric/Behavioral: Negative for depression. The patient is not nervous/anxious.    Exam: Physical Exam  Constitutional: He is oriented to person, place, and time.  HENT:  Nose: No mucosal edema.  Mouth/Throat: No oropharyngeal exudate or posterior oropharyngeal edema.  Eyes: Pupils are equal, round, and reactive to light. Conjunctivae, EOM and lids are normal.  Neck: No JVD present. Carotid bruit is not present. No edema present. No thyroid mass and no thyromegaly present.  Cardiovascular: S1 normal and S2 normal. Exam reveals no gallop.  No murmur heard. Pulses:      Dorsalis pedis pulses are 2+ on the right side, and 2+ on the left side.  Respiratory: Effort normal. No respiratory distress. He has decreased breath sounds. He has no wheezes. He has no rhonchi. He has rales.  GI: Soft. Bowel sounds are normal. There is no  tenderness.  Musculoskeletal: He exhibits edema.       Right ankle: He exhibits swelling.       Left ankle: He exhibits swelling.  Lymphadenopathy:    He has no cervical adenopathy.  Neurological: He is alert and oriented to person, place, and time. No cranial nerve deficit.  Skin: Skin is warm. Nails show no clubbing.  Chronic lower extremity discoloration.  Weeping edema left leg.  Psychiatric: He has a normal mood and affect.      Data Reviewed: Basic Metabolic Panel: Recent Labs  Lab 08/26/17 0428 08/26/17 1106 08/27/17 0730 08/27/17 1445 08/28/17 0404  NA 139 136 135 136 136  K 5.7* 5.0 5.6* 5.3* 5.5*  CL 94* 93* 92* 93* 93*  CO2 37* 36* 37* 38* 36*  GLUCOSE 185* 323* 203* 193* 146*  BUN 52* 53* 57* 56* 59*  CREATININE 1.23 1.30* 1.38* 1.26* 1.25*  CALCIUM 8.8* 8.7* 8.7* 8.8* 8.8*  MG  --   --  2.6*  --   --    Liver Function Tests: No results for input(s): AST, ALT, ALKPHOS, BILITOT, PROT, ALBUMIN in the last 168 hours. CBC: Recent Labs  Lab 08/22/17 2057  WBC 14.5*  HGB 14.3  HCT 44.4  MCV 96.1  PLT 168   Cardiac Enzymes: Recent Labs  Lab 08/22/17 2057 08/23/17 0259 08/28/17 0714 08/28/17 1022 08/28/17 1856  TROPONINI 0.05* 0.05* 0.05* 0.05* 0.05*   BNP (last 3 results) Recent Labs    07/13/17 1416 08/16/17 1533 08/18/17 1206  BNP 312.0* 321.0*  273.0*      Recent Results (from the past 240 hour(s))  Culture, blood (routine x 2) Call MD if unable to obtain prior to antibiotics being given     Status: None   Collection Time: 08/23/17  2:58 AM  Result Value Ref Range Status   Specimen Description BLOOD LEFT ANTECUBITAL  Final   Special Requests   Final    BOTTLES DRAWN AEROBIC AND ANAEROBIC Blood Culture adequate volume   Culture   Final    NO GROWTH 5 DAYS Performed at [facility], [address] [place], [place] [address]    Report Status 08/28/2017 FINAL  Final  Culture, blood (routine x 2) Call MD if unable to obtain  prior to antibiotics being given     Status: None   Collection Time: 08/23/17  3:04 AM  Result Value Ref Range Status   Specimen Description BLOOD RIGHT ANTECUBITAL  Final   Special Requests   Final    BOTTLES DRAWN AEROBIC AND ANAEROBIC Blood Culture adequate volume   Culture   Final    NO GROWTH 5 DAYS Performed at [facility], [address] [place], [place] [address]    Report Status 08/28/2017 FINAL  Final  MRSA PCR Screening     Status: None   Collection Time: 08/23/17  1:30 PM  Result Value Ref Range Status   MRSA by PCR NEGATIVE NEGATIVE Final    Comment:        The GeneXpert MRSA Assay (FDA approved for NASAL specimens only), is one component of a comprehensive MRSA colonization surveillance program. It is not intended to diagnose MRSA infection nor to guide or monitor treatment for MRSA infections. Performed at [facility], [address] [place], [place] [address]       Scheduled Meds: . allopurinol  100 mg Oral Daily  . atorvastatin  80 mg Oral QHS  . chlorpheniramine-HYDROcodone  5 mL Oral Q12H  . furosemide  40 mg Intravenous BID  . insulin aspart  0-5 Units Subcutaneous QHS  . insulin aspart  0-9 Units Subcutaneous TID WC  . methylPREDNISolone (SOLU-MEDROL) injection  40 mg Intravenous Q12H  . traZODone  50 mg Oral QHS   Continuous Infusions: . ceFEPime (MAXIPIME) IV 2 g (08/29/17 0537)    Assessment/Plan:  1. Acute on chronic hypoxic respiratory failure.  Patient CT scan suggestive of bronchogenic cancer I have discussed with the patient and family regarding his current findings also have discussed this with the oncologist who will evaluate the patient. 2. COPD exacerbation continue Solu-Medrol 40 mg IV twice daily.  Continue nebulizer treatments.  Add Pulmicort 3. Left lower lobe pneumonia.  MRSA PCR negative,  Continue cefepime.  CT scan changes consistent with likely inflammatory changes unclear if this is pneumonia.   Procalcitonin level is normal. 4. Acute on chronic diastolic congestive heart failure.  Continue diuresis with IV Lasix 40 mg IV twice daily. 5. Chronic kidney disease stage III.  Watch with diuresis. 6. Chronic atrial fibrillation on Eliquis for anticoagulation and metoprolol for rate control 7. Hyperlipidemia unspecified on atorvastatin 8. Insomnia continue trazodone   Code Status:     Code Status Orders  (From admission, onward)        Start     Ordered   08/23/17 0211  Full code  Continuous     08/23/17 0210    Code Status History    Date Active Date Inactive Code Status Order ID Comments User Context  08/18/2017 1828 08/22/2017 1725 Full Code [id number]  Gladstone Lighter, MD Inpatient   06/04/2017 1414 06/10/2017 1744 DNR [id number]  Hillary Bow, MD Inpatient   06/04/2017 1244 06/04/2017 1414 Full Code [id number]  Hillary Bow, MD Inpatient   12/23/2016 0806 12/27/2016 1617 Full Code [id number]  Lance Coon, MD Inpatient   12/23/2016 0714 12/23/2016 0806 Full Code [id number]  Hillary Bow, MD ED   12/30/2015 1429 12/30/2015 1859 Full Code [id number]  Dew, Erskine Squibb, MD Inpatient    Advance Directive Documentation     Most Recent Value  Type of Advance Directive  Living will  Pre-existing out of facility DNR order (yellow form or pink MOST form)  -  "MOST" Form in Place?  -     Family Communication: His wife and son at the bedside Disposition Plan: Home with HHPT in 2-3 days.  Consultants:  Cardiology  Antibiotics:  Cefepime  Time spent: 35 minutes  [place]

## 2017-08-29 NOTE — Progress Notes (Signed)
Advanced care plan.  Purpose of the Encounter: CODE STATUS  Parties in Attendance: Patient's wife daughter son  Patient's Decision Capacity: Not intact due to confusion  Subjective/Patient's story: Patient is a 84 year old with COPD, CHF now likely has bronchogenic cancer has not shown any improvement   Objective/Medical story  I discussed with the patient and family regarding CODE STATUS and recommended DNR status  Goals of care determination:  DNR status   CODE STATUS: DNR   Time spent discussing advanced care planning: 16 minutes

## 2017-08-29 NOTE — Progress Notes (Signed)
Date: 08/29/2017,   MRN# [id number] Norman Morales [birth year] Code Status:     Code Status Orders  (From admission, onward)        Start     Ordered   08/29/17 1416  Do not attempt resuscitation (DNR)  Continuous    Question Answer Comment  In the event of cardiac or respiratory ARREST Do not call a "code blue"   In the event of cardiac or respiratory ARREST Do not perform Intubation, CPR, defibrillation or ACLS   In the event of cardiac or respiratory ARREST Use medication by any route, position, wound care, and other measures to relive pain and suffering. May use oxygen, suction and manual treatment of airway obstruction as needed for comfort.      08/29/17 1415    Code Status History    Date Active Date Inactive Code Status Order ID Comments User Context   08/23/2017 0210 08/29/2017 1415 Full Code [id number]  Amelia Jo, MD Inpatient   08/18/2017 1828 08/22/2017 1725 Full Code [id number]  Gladstone Lighter, MD Inpatient   06/04/2017 1414 06/10/2017 1744 DNR [id number]  Hillary Bow, MD Inpatient   06/04/2017 1244 06/04/2017 1414 Full Code [id number]  Hillary Bow, MD Inpatient   12/23/2016 0806 12/27/2016 1617 Full Code [id number]  Lance Coon, MD Inpatient   12/23/2016 0714 12/23/2016 0806 Full Code [id number]  Hillary Bow, MD ED   12/30/2015 1429 12/30/2015 1859 Full Code [id number]  Dew, Erskine Squibb, MD Inpatient    Advance Directive Documentation     Most Recent Value  Type of Advance Directive  Living will  Pre-existing out of facility DNR order (yellow form or pink MOST form)  -  "MOST" Form in Place?  -       CC: lung mass  HPI: This is an 84 yr old male with multiple chronic problems. Came back in with weakness, worsening shortness of breath, known hx cf, copd, ex smoker, on oxygen. Chest ct was done as part of the w/u:  CLINICAL DATA:  Inpatient.  Cough.  Dyspnea.  Hypoxia.  Pneumonia.  EXAM: CT CHEST WITHOUT CONTRAST  TECHNIQUE: Multidetector CT imaging of the chest  was performed following the standard protocol without IV contrast.  COMPARISON:  08/22/2017 chest radiograph.  06/23/2013 chest CT.  FINDINGS: Cardiovascular: Mild cardiomegaly. No significant pericardial effusion/thickening. Left main and 3 vessel coronary atherosclerosis status post CABG. Atherosclerotic nonaneurysmal thoracic aorta. Dilated main pulmonary artery (4.1 cm diameter).  Mediastinum/Nodes: No discrete thyroid nodules. Unremarkable esophagus. No axillary adenopathy. Enlarged 1.5 cm right lower paratracheal node (series 8/image 61), stable since 06/23/2013. Mildly enlarged right prevascular mediastinal nodes up to 1.5 cm (series 8/image 81), increased from 1.0 cm on 06/23/2013. No discrete hilar adenopathy on these noncontrast images.  Lungs/Pleura: No pneumothorax. Small dependent bilateral pleural effusions. There is a 4.1 x 3.5 cm central right lower lobe lung mass (series 3/image 72), which may represent growth of the 1.3 x 0.6 cm medial superior segment right lower lobe pulmonary nodule described on 06/23/2013 chest CT. There is a spiculated 2.3 x 1.7 cm posterior right upper lobe pulmonary nodule (series 3/image 62), increased from 1.5 x 1.2 cm. There is extensive patchy parahilar ground-glass opacity throughout both lungs, predominantly in the upper lobes. There is moderate atelectasis in left lung base. No additional significant pulmonary nodules in the aerated portions of the lungs.  Upper abdomen: Cholelithiasis. Left adrenal 3.3 cm mass with density 65 HU (series 8/image 151), increased from 1.0 cm on 09/20/2015 CT.  Musculoskeletal: No aggressive appearing focal osseous lesions. Prominent symmetric gynecomastia. Chronic fractured sternotomy wires with nonunion at the median sternotomy. Mild thoracic spondylosis.  IMPRESSION: 1. Central right lower lobe 4.1 cm lung mass, which may represent growth of the 1.3 cm medial superior segment right lower  lobe pulmonary nodule described on the 06/23/2013 chest CT. 2. Spiculated 2.3 cm posterior right upper lobe pulmonary nodule, increased in size since 2015 chest CT. 3. These findings are suspicious for enlarging synchronous primary bronchogenic carcinomas. These nodules were mildly FDG avid on 07/03/2013 PET-CT. 4. Left adrenal 3.3 cm indeterminate mass, significantly increased in size since 09/20/2015 CT abdomen study, worrisome for left adrenal metastasis. 5. Mild prevascular right mediastinal adenopathy, increased from 2015. Mild right lower paratracheal adenopathy, stable since 2015. These nodes are indeterminate for metastatic disease. 6. Suggest short-term outpatient PET-CT for further characterization of the above findings, when clinically feasible. 7. Mild cardiomegaly. Dilated main pulmonary artery, suggesting pulmonary arterial hypertension. 8. Extensive patchy parahilar ground-glass opacity throughout both lungs, probably cardiogenic pulmonary edema. 9. Small dependent bilateral pleural effusions. Moderate left lung base atelectasis. 10. Cholelithiasis.  Aortic Atherosclerosis (ICD10-I70.0).   Electronically Signed   By: Ilona Sorrel M.D.   On: 08/28/2017 13:53   PMHX:   Past Medical History:  Diagnosis Date  . A-fib ([place])   . Adrenal adenoma   . CHF (congestive heart failure) ([place])   . Coronary artery disease   . DDD (degenerative disc disease), cervical   . Hyperlipidemia   . Hypertension   . Lung nodule   . Restrictive lung disease    Surgical Hx:  Past Surgical History:  Procedure Laterality Date  . CARDIAC SURGERY    . CATARACT EXTRACTION, BILATERAL    . CORONARY ARTERY BYPASS GRAFT    . EXPLORATION POST OPERATIVE OPEN HEART    . LUMBAR FUSION    . PERIPHERAL VASCULAR CATHETERIZATION Left 12/30/2015   Procedure: Lower Extremity Angiography;  Surgeon: Algernon Huxley, MD;  Location: [facility];  Service: Cardiovascular;  Laterality: Left;   . PERIPHERAL VASCULAR CATHETERIZATION  12/30/2015   Procedure: Lower Extremity Intervention;  Surgeon: Algernon Huxley, MD;  Location: [facility];  Service: Cardiovascular;;  . REPLACEMENT TOTAL KNEE BILATERAL     Family Hx:  Family History  Problem Relation Age of Onset  . Aortic aneurysm Mother    Social Hx:   Social History   Tobacco Use  . Smoking status: Former Smoker    Packs/day: 1.00    Years: 42.00    Pack years: 42.00    Types: Cigarettes  . Smokeless tobacco: Never Used  . Tobacco comment: quit 42 years ago  Substance Use Topics  . Alcohol use: Not Currently    Comment: occasionally  . Drug use: No   Medication:    Home Medication:    Current Medication: @CURMEDTAB @   Allergies:  Ambien [zolpidem tartrate]  Review of Systems: Gen:  Denies  fever, sweats, chills HEENT: Denies blurred vision, double vision, ear pain, eye pain, hearing loss, nose bleeds, sore throat Cvc:  No dizziness, chest pain or heaviness Resp:    Gi: Denies swallowing difficulty, stomach pain, nausea or vomiting, diarrhea, constipation, bowel incontinence Gu:  Denies bladder incontinence, burning urine Ext:   No Joint pain, stiffness or swelling Skin: No skin rash, easy bruising or bleeding or hives Endoc:  No polyuria, polydipsia , polyphagia or weight change Psych: No depression, insomnia or hallucinations  Other:  All  other systems negative  Physical Examination:   VS: BP (!) 156/95 (BP Location: Left Arm)   Pulse (!) 57   Temp 98.5 F (36.9 C)   Resp 18   Ht 6' (1.829 m)   Wt 126.5 kg (278 lb 12.8 oz)   SpO2 94%   BMI 37.81 kg/m   General Appearance: No distress, over weight, multiple family members in the room. Easily falling asleep.  Neuro: without focal findings, decrease mentation and alertness.  cranial nerves 2-12 intact, reflexes normal and symmetric, sensation grossly normal  HEENT: PERRLA, EOM intact, no ptosis, no other lesions noticed,  NECK: Supple,  no stridor Pulmonary:.decrease bs. No wheezing, No rales   Cardiovascular:  Normal S1,S2.  No m/r/g.  distant.    Abdomen:Benign, Soft, non-tender, No masses, hepatosplenomegaly, No lymphadenopathy Endoc: No evident thyromegaly, no signs of acromegaly or Cushing features Skin:   warm, no rashes, no ecchymosis  Extremities: normal, no cyanosis, clubbing, + edema, warm with normal capillary refill.    Labs results:   Recent Labs    08/27/17 0730 08/27/17 1445 08/28/17 0404  BUN 57* 56* 59*  CREATININE 1.38* 1.26* 1.25*  GLUCOSE 203* 193* 146*  CALCIUM 8.7* 8.8* 8.8*  ,   Assessment and Plan: 1. Central right lower lobe 4.1 cm lung mass, which may represent growth of the 1.3 cm medial superior segment right lower lobe pulmonary nodule described on the 06/23/2013 chest CT. 2. Spiculated 2.3 cm posterior right upper lobe pulmonary nodule, increased in size since 2015 chest CT. 3. These findings are suspicious for enlarging synchronous primary bronchogenic carcinomas. These nodules were mildly FDG avid on 07/03/2013 PET-CT. 4. Left adrenal 3.3 cm indeterminate mass, significantly increased in size since 09/20/2015 CT abdomen study, worrisome for left adrenal metastasis. Looks like malignancy with mets. Not a surgical candidate. I did mention to the the family and wife due to his general healtthat chemo would be prohibitive. ? efficacy of radiation  Recommended oncology consult No bronchoscopy ( family refused, I agree high risk for vent placement post bronch) Family to decide on DNR, hospice and ? Need for tissue dx ( needle bx of adrenal gland etc)  ? Need for pet scan per  Suspect sleep apnea Per above Keep comfortable, no further w/u at this time  Extensive patchy parahilar ground-glass opacity throughout both lungs, + bilateral effusions, probably cardiogenic pulmonary edema vs pneumonia Diuresis  Emperic antibiotis (gm - coverage)   Decrease alert ness. ? Decrease po,  increase co2, meds, on high fio2. ?? Cns mets Mental status monitoring Avoid heavy sedation > bipap As per above Keep sats 90-92 range     I have personally obtained a history, examined the patient, evaluated laboratory and imaging results, formulated the assessment and plan and placed orders.  The Patient requires high complexity decision making for assessment and support, frequent evaluation and titration of therapies, application of advanced monitoring technologies and extensive interpretation of multiple databases.   Herbon Fleming,M.D. Pulmonary Medicine [facility]

## 2017-08-29 NOTE — Progress Notes (Signed)
Received call from RN stating patient's o2 sats dropped on auto cpap. cpap unit appeared to have shut off so she placed patient back on nasal o2. I have placed patient back on v60 cpap unit with setting of 14 which is what is he was set on when he was admitted to floor a couple of days ago.  O2 30% Patient is tolerating well. Saturation noted at 92%.

## 2017-08-29 NOTE — Progress Notes (Signed)
Patient ID: Norman Morales, male   DOB: [birth year], 84 y.o.   MRN: [id number]  [facility] PROGRESS NOTE  Norman Morales ZSW:[id number] DOB: [birth year] DOA: 08/22/2017 PCP: Adin Hector, MD  HPI/Subjective: Called by nurse due to patient being poorly responsive.  Patient currently lethargic   Objective: Vitals:   08/29/17 0733 08/29/17 1346  BP: (!) 156/95   Pulse: (!) 57   Resp:    Temp: 98.5 F (36.9 C)   SpO2: 100% 94%    Filed Weights   08/27/17 0441 08/28/17 0416 08/29/17 0350  Weight: 127.9 kg (281 lb 14.4 oz) 126.6 kg (279 lb 3.2 oz) 126.5 kg (278 lb 12.8 oz)    ROS: Review of Systems  Unable to perform ROS: Mental status change   Exam: Physical Exam  HENT:  Nose: No mucosal edema.  Mouth/Throat: No oropharyngeal exudate or posterior oropharyngeal edema.  Eyes: Pupils are equal, round, and reactive to light. Conjunctivae, EOM and lids are normal.  Neck: No JVD present. Carotid bruit is not present. No edema present. No thyroid mass and no thyromegaly present.  Cardiovascular: S1 normal and S2 normal. Exam reveals no gallop.  No murmur heard. Pulses:      Dorsalis pedis pulses are 2+ on the right side, and 2+ on the left side.  Respiratory: Effort normal. No respiratory distress. He has decreased breath sounds. He has no wheezes. He has no rhonchi. He has rales.  GI: Soft. Bowel sounds are normal. There is no tenderness.  Musculoskeletal: He exhibits edema.       Right ankle: He exhibits swelling.       Left ankle: He exhibits swelling.  Lymphadenopathy:    He has no cervical adenopathy.  Neurological: No cranial nerve deficit.  Patient poorly responsive  Skin: Skin is warm. Nails show no clubbing.  Chronic lower extremity discoloration.  Weeping edema left leg.  Psychiatric: He has a normal mood and affect.      Data Reviewed: Basic Metabolic Panel: Recent Labs  Lab 08/26/17 0428 08/26/17 1106 08/27/17 0730 08/27/17 1445 08/28/17 0404   NA 139 136 135 136 136  K 5.7* 5.0 5.6* 5.3* 5.5*  CL 94* 93* 92* 93* 93*  CO2 37* 36* 37* 38* 36*  GLUCOSE 185* 323* 203* 193* 146*  BUN 52* 53* 57* 56* 59*  CREATININE 1.23 1.30* 1.38* 1.26* 1.25*  CALCIUM 8.8* 8.7* 8.7* 8.8* 8.8*  MG  --   --  2.6*  --   --    Liver Function Tests: No results for input(s): AST, ALT, ALKPHOS, BILITOT, PROT, ALBUMIN in the last 168 hours. CBC: Recent Labs  Lab 08/22/17 2057  WBC 14.5*  HGB 14.3  HCT 44.4  MCV 96.1  PLT 168   Cardiac Enzymes: Recent Labs  Lab 08/22/17 2057 08/23/17 0259 08/28/17 0714 08/28/17 1022 08/28/17 1856  TROPONINI 0.05* 0.05* 0.05* 0.05* 0.05*   BNP (last 3 results) Recent Labs    07/13/17 1416 08/16/17 1533 08/18/17 1206  BNP 312.0* 321.0* 273.0*      Recent Results (from the past 240 hour(s))  Culture, blood (routine x 2) Call MD if unable to obtain prior to antibiotics being given     Status: None   Collection Time: 08/23/17  2:58 AM  Result Value Ref Range Status   Specimen Description BLOOD LEFT ANTECUBITAL  Final   Special Requests   Final    BOTTLES DRAWN AEROBIC AND ANAEROBIC Blood Culture adequate volume  Culture   Final    NO GROWTH 5 DAYS Performed at [facility], [address] [place], [place] [address]    Report Status 08/28/2017 FINAL  Final  Culture, blood (routine x 2) Call MD if unable to obtain prior to antibiotics being given     Status: None   Collection Time: 08/23/17  3:04 AM  Result Value Ref Range Status   Specimen Description BLOOD RIGHT ANTECUBITAL  Final   Special Requests   Final    BOTTLES DRAWN AEROBIC AND ANAEROBIC Blood Culture adequate volume   Culture   Final    NO GROWTH 5 DAYS Performed at [facility], [address] [place], [place] [address]    Report Status 08/28/2017 FINAL  Final  MRSA PCR Screening     Status: None   Collection Time: 08/23/17  1:30 PM  Result Value Ref Range Status   MRSA by PCR NEGATIVE NEGATIVE Final     Comment:        The GeneXpert MRSA Assay (FDA approved for NASAL specimens only), is one component of a comprehensive MRSA colonization surveillance program. It is not intended to diagnose MRSA infection nor to guide or monitor treatment for MRSA infections. Performed at [facility], [address] [place], [place] [address]       Scheduled Meds: . allopurinol  100 mg Oral Daily  . atorvastatin  80 mg Oral QHS  . chlorpheniramine-HYDROcodone  5 mL Oral Q12H  . furosemide  40 mg Intravenous BID  . insulin aspart  0-5 Units Subcutaneous QHS  . insulin aspart  0-9 Units Subcutaneous TID WC  . methylPREDNISolone (SOLU-MEDROL) injection  40 mg Intravenous Q12H  . traZODone  50 mg Oral QHS   Continuous Infusions: . ceFEPime (MAXIPIME) IV 2 g (08/29/17 0537)    Assessment/Plan:  1. Decrease in responsiveness could be related to hypercarbia I have ordered a ABG as patient may need BiPAP 2. Acute on chronic hypoxic respiratory failure.  Patient CT scan suggestive of bronchogenic cancer I have discussed with the patient and family regarding his current findings also have discussed this with the oncologist who will evaluate the patient. 3. COPD exacerbation continue Solu-Medrol 40 mg IV twice daily.  Continue nebulizer treatments.  Add Pulmicort 4. Left lower lobe pneumonia.  MRSA PCR negative,  Continue cefepime.  CT scan changes consistent with likely inflammatory changes unclear if this is pneumonia.  Procalcitonin level is normal. 5. Acute on chronic diastolic congestive heart failure.  Continue diuresis with IV Lasix 40 mg IV twice daily. 6. Chronic kidney disease stage III.  Watch with diuresis. 7. Chronic atrial fibrillation on Eliquis for anticoagulation and metoprolol for rate control 8. Hyperlipidemia unspecified on atorvastatin 9. Insomnia continue trazodone   Code Status:     Code Status Orders  (From admission, onward)        Start     Ordered    08/23/17 0211  Full code  Continuous     08/23/17 0210    Code Status History    Date Active Date Inactive Code Status Order ID Comments User Context   08/18/2017 1828 08/22/2017 1725 Full Code [id number]  Gladstone Lighter, MD Inpatient   06/04/2017 1414 06/10/2017 1744 DNR [id number]  Hillary Bow, MD Inpatient   06/04/2017 1244 06/04/2017 1414 Full Code [id number]  Hillary Bow, MD Inpatient   12/23/2016 0806 12/27/2016 1617 Full Code [id number]  Lance Coon, MD Inpatient   12/23/2016 [phone] 12/23/2016 0806 Full  Code [id number]  Hillary Bow, MD ED   12/30/2015 1429 12/30/2015 1859 Full Code [id number]  Algernon Huxley, MD Inpatient    Advance Directive Documentation     Most Recent Value  Type of Advance Directive  Living will  Pre-existing out of facility DNR order (yellow form or pink MOST form)  -  "MOST" Form in Place?  -     Family Communication: His wife and son at the bedside Disposition Plan: To be determined Consultants:  Cardiology  Antibiotics:  Cefepime  Time spent: Additional 45 minutes spent [employer]

## 2017-08-29 NOTE — Progress Notes (Signed)
PT Cancellation Note  Patient Details Name: Norman Morales MRN: [id number] DOB: Nov 29, 1926   Cancelled Treatment:    Reason Eval/Treat Not Completed: Other (comment)(Low O2 sats and fragile).  Pt is not ready for PT so will try again tomorrow, but may need to DC order if pt is still not ready.   Ramond Dial 08/29/2017, 11:05 AM   Mee Hives, PT MS Acute Rehab Dept. Number: [place]

## 2017-08-29 NOTE — Progress Notes (Signed)
Called by CCMD that pt's Oxygen level was in the 80's, this nurse went into pt's room and pt's oxygen level in the 50's to 70's on 5L via [place], respiratory was called to place pt on Bi-pap and pt was placed on non-rebreather mask. Oxygen up to 80's on non-rebreather mask. Pt's O2 levels in the 90-100 on Bi-pap. MD Jannifer [place] notified. Family at Bedside. Will continue to monitor.

## 2017-08-29 NOTE — Plan of Care (Signed)

## 2017-08-29 NOTE — Consult Note (Signed)
Hematology/Oncology Consult note [facility] [phone] [phone]  Patient Care Team: Adin Hector, MD as PCP - General (Internal Medicine)   Name of the patient: Norman Morales  [id number]  1926/08/05    Reason for consult: lung mass   Requesting physician: Dr. Posey Pronto  Date of visit: 08/29/2017    History of presenting illness-patient is a 84 year old male with multiple medical problems including CHF, CKD, coronary artery disease, obstructive sleep apnea, osteoarthritis and atrial fibrillation among other comorbidities.  He has had 3 admissions in the last 2 months for worsening episodes of acute on chronic hypoxic respiratory failure and is currently being treated for possible pneumonia.  Patient has been followed by Dr. Odetta Pink from [facility] in the past for his lung nodules which have been gradually increasing in size.  He did undergo a biopsy in 2015 which did not reveal any evidence of malignancy.  His lung nodules have been gradually slowly increasing in size since 2015.  Patient was also noted to have a left adrenal nodule which had not been evaluated given his comorbidities.  Oncology has now been consulted to comment on CT chest findings which shows a 4.1 x 3.5 cm mass in his right lower lobe along with a separate significant 2.3 x 1.7 cm mass in his right upper lobe.  Also has noted left adrenal nodule which was previously 1 cm back in 2017 has grown to 3 cm with a density of 65 with a density of 65 HU suspicious for malignancy.  I met with the patient and his family at the bedside today.  Patient is currently on 5 L of oxygen and was sitting up in his chair playing cards with his family but often sleep in between conversations and is unable to quit his attention throughout my conversation today.  Patient lives with his wife and his children live close by.  ECOG PS- 2-3  Pain scale- unable to assess   Review of systems- Review of Systems   Unable to perform ROS: Other  Patient drifts off to sleep in between conversation  Allergies  Allergen Reactions  . Ambien [Zolpidem Tartrate] Other (See Comments)    Nightmares/ yelling out     Patient Active Problem List   Diagnosis Date Noted  . Acute exacerbation of CHF (congestive heart failure) ([place]) 08/23/2017  . CHF exacerbation ([place]) 08/18/2017  . Obstructive sleep apnea 07/12/2017  . Hyperlipidemia 09/04/2016  . PAD (peripheral artery disease) ([place]) 09/04/2016  . Swelling of limb 03/27/2016  . Arrhythmia 12/24/2015  . Essential hypertension 12/24/2015  . Congestive heart failure ([place]) 12/24/2015  . Atherosclerosis of native arteries of the extremities with ulceration ([place]) 12/24/2015  . CAD (coronary artery disease) of artery bypass graft 12/24/2015     Past Medical History:  Diagnosis Date  . A-fib ([place])   . Adrenal adenoma   . CHF (congestive heart failure) ([place])   . Coronary artery disease   . DDD (degenerative disc disease), cervical   . Hyperlipidemia   . Hypertension   . Lung nodule   . Restrictive lung disease      Past Surgical History:  Procedure Laterality Date  . CARDIAC SURGERY    . CATARACT EXTRACTION, BILATERAL    . CORONARY ARTERY BYPASS GRAFT    . EXPLORATION POST OPERATIVE OPEN HEART    . LUMBAR FUSION    . PERIPHERAL VASCULAR CATHETERIZATION Left 12/30/2015   Procedure: Lower Extremity Angiography;  Surgeon: Algernon Huxley,  MD;  Location: [facility];  Service: Cardiovascular;  Laterality: Left;  . PERIPHERAL VASCULAR CATHETERIZATION  12/30/2015   Procedure: Lower Extremity Intervention;  Surgeon: Algernon Huxley, MD;  Location: [facility];  Service: Cardiovascular;;  . REPLACEMENT TOTAL KNEE BILATERAL      Social History   Socioeconomic History  . Marital status: Married    Spouse name: Not on file  . Number of children: Not on file  . Years of education: Not on file  . Highest education level: Not on file    Occupational History  . Not on file  Social Needs  . Financial resource strain: Not on file  . Food insecurity:    Worry: Not on file    Inability: Not on file  . Transportation needs:    Medical: Not on file    Non-medical: Not on file  Tobacco Use  . Smoking status: Former Smoker    Packs/day: 1.00    Years: 42.00    Pack years: 42.00    Types: Cigarettes  . Smokeless tobacco: Never Used  . Tobacco comment: quit 42 years ago  Substance and Sexual Activity  . Alcohol use: Not Currently    Comment: occasionally  . Drug use: No  . Sexual activity: Not on file  Lifestyle  . Physical activity:    Days per week: Not on file    Minutes per session: Not on file  . Stress: Not on file  Relationships  . Social connections:    Talks on phone: Not on file    Gets together: Not on file    Attends religious service: Not on file    Active member of club or organization: Not on file    Attends meetings of clubs or organizations: Not on file    Relationship status: Not on file  . Intimate partner violence:    Fear of current or ex partner: Not on file    Emotionally abused: Not on file    Physically abused: Not on file    Forced sexual activity: Not on file  Other Topics Concern  . Not on file  Social History Narrative  . Not on file     Family History  Problem Relation Age of Onset  . Aortic aneurysm Mother      Current Facility-Administered Medications:  .  acetaminophen (TYLENOL) tablet 650 mg, 650 mg, Oral, Q6H PRN, Leslye Peer, Richard, MD, 650 mg at 08/29/17 0113 .  acetylcysteine (MUCOMYST) 20 % nebulizer / oral solution 4 mL, 4 mL, Nebulization, BID PRN, Lance Coon, MD .  allopurinol (ZYLOPRIM) tablet 100 mg, 100 mg, Oral, Daily, Amelia Jo, MD, 100 mg at 08/29/17 1610 .  atorvastatin (LIPITOR) tablet 80 mg, 80 mg, Oral, QHS, Amelia Jo, MD, 80 mg at 08/28/17 2059 .  azelastine (ASTELIN) 0.1 % nasal spray 1-2 spray, 1-2 spray, Each Nare, BID PRN, Amelia Jo, MD .  budesonide (PULMICORT) nebulizer solution 0.25 mg, 0.25 mg, Nebulization, BID PRN, Lance Coon, MD .  ceFEPIme (MAXIPIME) 2 g in sodium chloride 0.9 % 100 mL IVPB, 2 g, Intravenous, Q24H, Maccia, Melissa D, RPH, Last Rate: 200 mL/hr at 08/29/17 0537, 2 g at 08/29/17 0537 .  chlorpheniramine-HYDROcodone (TUSSIONEX) 10-8 MG/5ML suspension 5 mL, 5 mL, Oral, Q12H, Kalisetti, Radhika, MD, 5 mL at 08/29/17 0926 .  furosemide (LASIX) injection 40 mg, 40 mg, Intravenous, BID, Amelia Jo, MD, 40 mg at 08/29/17 1700 .  guaiFENesin (MUCINEX) 12 hr tablet 600  mg, 600 mg, Oral, BID PRN, Lance Coon, MD, 600 mg at 08/29/17 0113 .  hydrALAZINE (APRESOLINE) injection 10 mg, 10 mg, Intravenous, Q4H PRN, Lance Coon, MD, 10 mg at 08/27/17 1652 .  insulin aspart (novoLOG) injection 0-5 Units, 0-5 Units, Subcutaneous, QHS, Demetrios Loll, MD, 4 Units at 08/27/17 2239 .  insulin aspart (novoLOG) injection 0-9 Units, 0-9 Units, Subcutaneous, TID WC, Demetrios Loll, MD, 1 Units at 08/29/17 1745 .  iopamidol (ISOVUE-300) 61 % injection 30 mL, 30 mL, Oral, Once PRN, Dustin Flock, MD .  ipratropium (ATROVENT) nebulizer solution 0.5 mg, 0.5 mg, Nebulization, Q6H PRN, [place]stone Lighter, MD .  levalbuterol (XOPENEX) nebulizer solution 1.25 mg, 1.25 mg, Nebulization, Q6H PRN, [place] Lighter, MD .  Melatonin TABS 20 mg, 20 mg, Oral, Daily PRN, Amelia Jo, MD, 20 mg at 08/26/17 2355 .  methylPREDNISolone sodium succinate (SOLU-MEDROL) 40 mg/mL injection 40 mg, 40 mg, Intravenous, Q12H, Wieting, Richard, MD, 40 mg at 08/29/17 1040 .  polyethylene glycol (MIRALAX / GLYCOLAX) packet 17 g, 17 g, Oral, Daily PRN, Amelia Jo, MD, 17 g at 08/27/17 1453 .  traZODone (DESYREL) tablet 50 mg, 50 mg, Oral, QHS, Wieting, Richard, MD, 50 mg at 08/28/17 2200   Physical exam:  Vitals:   08/29/17 0350 08/29/17 0634 08/29/17 0733 08/29/17 1346  BP: (!) 150/82  (!) 156/95   Pulse: 66  (!) 57   Resp: 18     Temp:  98 F (36.7 C)  98.5 F (36.9 C)   TempSrc:      SpO2: (!) 86% 98% 100% 94%  Weight: 278 lb 12.8 oz (126.5 kg)     Height:       Physical Exam  Constitutional: He is oriented to person, place, and time.   Patient is obese.  He is on 5 L of oxygen and appears drowsy but easily arousable  HENT:  Head: Normocephalic and atraumatic.  Eyes: Pupils are equal, round, and reactive to light. EOM are normal.  Neck: Normal range of motion.  Cardiovascular: Normal rate, regular rhythm and normal heart sounds.  Pulmonary/Chest: Effort normal and breath sounds normal.  Abdominal: Soft. Bowel sounds are normal.  Neurological: He is alert and oriented to person, place, and time.  Skin: Skin is warm and dry.       CMP Latest Ref Rng & Units 08/28/2017  Glucose 70 - 99 mg/dL 146(H)  BUN 8 - 23 mg/dL 59(H)  Creatinine 0.61 - 1.24 mg/dL 1.25(H)  Sodium 135 - 145 mmol/L 136  Potassium 3.5 - 5.1 mmol/L 5.5(H)  Chloride 98 - 111 mmol/L 93(L)  CO2 22 - 32 mmol/L 36(H)  Calcium 8.9 - 10.3 mg/dL 8.8(L)  Total Protein 6.5 - 8.1 g/dL -  Total Bilirubin 0.3 - 1.2 mg/dL -  Alkaline Phos 38 - 126 U/L -  AST 15 - 41 U/L -  ALT 17 - 63 U/L -   CBC Latest Ref Rng & Units 08/22/2017  WBC 3.8 - 10.6 K/uL 14.5(H)  Hemoglobin 13.0 - 18.0 g/dL 14.3  Hematocrit 40.0 - 52.0 % 44.4  Platelets 150 - 440 K/uL 168    @IMAGES @  Dg Chest 2 View  Result Date: 08/21/2017 CLINICAL DATA:  Cough. EXAM: CHEST - 2 VIEW COMPARISON:  08/18/2017 FINDINGS: Sequelae of CABG are again identified with multiple fractured sternal wires. The cardiac silhouette remains enlarged. Pulmonary vascular congestion is stable to mildly increased. There is chronic nodularity in the left lung apex. Streaky opacities in both lung bases  are similar to the prior study and suggest atelectasis. No pleural effusion or pneumothorax is identified. IMPRESSION: 1. Cardiomegaly with mildly worsening pulmonary vascular congestion. 2. Low lung volumes  with bibasilar atelectasis. Electronically Signed   By: Logan Bores M.D.   On: 08/21/2017 10:53   Dg Chest 2 View  Result Date: 08/18/2017 CLINICAL DATA:  Shortness of breath. EXAM: CHEST - 2 VIEW COMPARISON:  Chest x-ray dated Jun 09, 2017. FINDINGS: Stable cardiomegaly status post CABG. Mild pulmonary vascular congestion. Unchanged scarring in the left lower lobe. Increasing patchy opacity in the right lower lobe. No focal consolidation, pleural effusion, or pneumothorax. No acute osseous abnormality. Multiple fractured sternal wires are unchanged. IMPRESSION: 1. Increased patchy opacity in the right lower lobe could reflect atelectasis or infiltrate. 2. Mild pulmonary vascular congestion without overt edema. Electronically Signed   By: Titus Dubin M.D.   On: 08/18/2017 13:02   Ct Chest Wo Contrast  Result Date: 08/28/2017 CLINICAL DATA:  Inpatient.  Cough.  Dyspnea.  Hypoxia.  Pneumonia. EXAM: CT CHEST WITHOUT CONTRAST TECHNIQUE: Multidetector CT imaging of the chest was performed following the standard protocol without IV contrast. COMPARISON:  08/22/2017 chest radiograph.  06/23/2013 chest CT. FINDINGS: Cardiovascular: Mild cardiomegaly. No significant pericardial effusion/thickening. Left main and 3 vessel coronary atherosclerosis status post CABG. Atherosclerotic nonaneurysmal thoracic aorta. Dilated main pulmonary artery (4.1 cm diameter). Mediastinum/Nodes: No discrete thyroid nodules. Unremarkable esophagus. No axillary adenopathy. Enlarged 1.5 cm right lower paratracheal node (series 8/image 61), stable since 06/23/2013. Mildly enlarged right prevascular mediastinal nodes up to 1.5 cm (series 8/image 81), increased from 1.0 cm on 06/23/2013. No discrete hilar adenopathy on these noncontrast images. Lungs/Pleura: No pneumothorax. Small dependent bilateral pleural effusions. There is a 4.1 x 3.5 cm central right lower lobe lung mass (series 3/image 72), which may represent growth of the 1.3 x  0.6 cm medial superior segment right lower lobe pulmonary nodule described on 06/23/2013 chest CT. There is a spiculated 2.3 x 1.7 cm posterior right upper lobe pulmonary nodule (series 3/image 62), increased from 1.5 x 1.2 cm. There is extensive patchy parahilar ground-glass opacity throughout both lungs, predominantly in the upper lobes. There is moderate atelectasis in left lung base. No additional significant pulmonary nodules in the aerated portions of the lungs. Upper abdomen: Cholelithiasis. Left adrenal 3.3 cm mass with density 65 HU (series 8/image 151), increased from 1.0 cm on 09/20/2015 CT. Musculoskeletal: No aggressive appearing focal osseous lesions. Prominent symmetric gynecomastia. Chronic fractured sternotomy wires with nonunion at the median sternotomy. Mild thoracic spondylosis. IMPRESSION: 1. Central right lower lobe 4.1 cm lung mass, which may represent growth of the 1.3 cm medial superior segment right lower lobe pulmonary nodule described on the 06/23/2013 chest CT. 2. Spiculated 2.3 cm posterior right upper lobe pulmonary nodule, increased in size since 2015 chest CT. 3. These findings are suspicious for enlarging synchronous primary bronchogenic carcinomas. These nodules were mildly FDG avid on 07/03/2013 PET-CT. 4. Left adrenal 3.3 cm indeterminate mass, significantly increased in size since 09/20/2015 CT abdomen study, worrisome for left adrenal metastasis. 5. Mild prevascular right mediastinal adenopathy, increased from 2015. Mild right lower paratracheal adenopathy, stable since 2015. These nodes are indeterminate for metastatic disease. 6. Suggest short-term outpatient PET-CT for further characterization of the above findings, when clinically feasible. 7. Mild cardiomegaly. Dilated main pulmonary artery, suggesting pulmonary arterial hypertension. 8. Extensive patchy parahilar ground-glass opacity throughout both lungs, probably cardiogenic pulmonary edema. 9. Small dependent bilateral  pleural effusions. Moderate left lung  base atelectasis. 10. Cholelithiasis. Aortic Atherosclerosis (ICD10-I70.0). Electronically Signed   By: Ilona Sorrel M.D.   On: 08/28/2017 13:53   US Renal  Result Date: 08/19/2017 CLINICAL DATA:  Acute kidney injury, chronic kidney disease stage 3 EXAM: RENAL / URINARY TRACT ULTRASOUND COMPLETE COMPARISON:  CT 09/20/2015 FINDINGS: Right Kidney: Length: 10.5 cm. 2.3 cm cyst. Normal echotexture. Mild cortical thinning. No hydronephrosis. Left Kidney: Length: 11.7 cm. 1.2 cm lower pole cyst. Mild cortical thinning. Normal echotexture. No hydronephrosis. Rounded soft tissue area noted adjacent to the mid to lower pole of the left kidney. This does not appear to be definitively arising from the left kidney. This may simply reflect prominent perinephric fat. I am not convinced this is a true lesion. Bladder: Appears normal for degree of bladder distention. IMPRESSION: Mild cortical thinning bilaterally.  No hydronephrosis. Rounded soft tissue area adjacent to the mid to lower pole of the left kidney. I do not believe this is arising from the left kidney and may simply represent prominent perinephric fat. CT abdomen with contrast could be performed to completely exclude exophytic renal lesion. Electronically Signed   By: Rolm Baptise M.D.   On: 08/19/2017 18:54   Dg Chest Port 1 View  Result Date: 08/22/2017 CLINICAL DATA:  Shortness of breath EXAM: PORTABLE CHEST 1 VIEW COMPARISON:  August 21, 2017 FINDINGS: There is moderate interstitial edema. There is patchy consolidation in the left base. There is a minimal left pleural effusion. There is cardiomegaly with pulmonary venous hypertension. There is aortic atherosclerosis. Patient is status post coronary artery bypass grafting. Multiple sternal wires are fractured, a stable finding. No bone lesions. IMPRESSION: Cardiomegaly with pulmonary venous hypertension. Interstitial edema. Suspect a degree of congestive heart failure.  Probable superimposed pneumonia left base. There is aortic atherosclerosis. Aortic Atherosclerosis (ICD10-I70.0). Electronically Signed   By: Lowella Grip III M.D.   On: 08/22/2017 21:33    Assessment and plan- Patient is a 84 y.o. 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  Patient is drifting off to sleep in between sedation and is unable to understand his underlying medical condition and the possible plan for the same.  Patient has had 3 back to back admissions for acute on chronic hypoxic respiratory failure.  He does have multiple comorbidities including obesity hypoventilation syndrome, obstructive sleep apnea CHF and ongoing pneumonia which is compromising his respiratory status.  He has also been by pulmonary and he is not deemed to be a candidate for bronchoscopy given the risk of sedation.  I asked the family if they wish to consider work-up for this lung mass or if they are considering hospice given his multiple comorbidities.  Patient's family have not made up their mind regarding hospice but are open to idea of home based palliative care. Even if a diagnosis of lung cancer is made, he may not be a candidate for further treatment.  Patients family would like to work-up this mass further.  Given that he is not a candidate for bronchoscopy I would recommend an outpatient PET scan to assess the left adrenal lesion and to see if there are any other sites of distant metastatic disease which would be amenable to needle biopsy.    It is unclear if patient would  be able to lay flat for the PET scan and the biopsy but his family atleast desire an attempt for the same.  He never had a formal diagnosis of carcinoid but given the slow growth of his lung  nodules carcinoid versus low-grade adenocarcinoma was considered a possibility.  He did have a prior biopsy 2015 which was negative for malignancy.  However present findings are highly concerning for primary lung cancer likely stage IV given the left adrenal lesion.  Depending on his performance status post discharge from the hospital, if we are able to obtain a tissue diagnosis -I will assess if his performance status permits any kind of treatment such as immunotherapy although he is unlikely going to be a candidate for chemotherapy.  I will check plasma metanephrines tomorrow in anticipation of adrenal biopsy in the future.  If we are unable to obtain tissue diagnosis, based on CT findings he likely has Stage IV lung cancer and hospice would be a reasonable consideration at that time given his overall comorbidities. They would like his care at [facility] at this time and I will discuss all this further as an outpatient.  I will coordinate his PET scan and biopsy as well as outpatient appointment with me upon his discharge.    Patient's family had multiple insightful questions and all of them were answered to their satisfaction   Visit Diagnosis 1. Acute on chronic respiratory failure with hypoxia (HCC)   2. SOB (shortness of breath)     Dr. Randa Evens, MD, MPH [facility] at [facility] [phone] 08/29/2017 6:37 PM

## 2017-08-30 LAB — GLUCOSE, CAPILLARY
GLUCOSE-CAPILLARY: 217 mg/dL — AB (ref 70–99)
Glucose-Capillary: 138 mg/dL — ABNORMAL HIGH (ref 70–99)
Glucose-Capillary: 277 mg/dL — ABNORMAL HIGH (ref 70–99)
Glucose-Capillary: 146 mg/dL — ABNORMAL HIGH (ref 70–99)

## 2017-08-30 MED ORDER — METHOCARBAMOL 500 MG PO TABS
500.0000 mg | ORAL_TABLET | Freq: Once | ORAL | Status: AC
Start: 2017-08-30 — End: 2017-08-30
  Administered 2017-08-30: 500 mg via ORAL
  Filled 2017-08-30 (×3): qty 1

## 2017-08-30 MED ORDER — BUDESONIDE 0.25 MG/2ML IN SUSP
0.2500 mg | Freq: Two times a day (BID) | RESPIRATORY_TRACT | Status: DC
  Administered 2017-08-30 – 2017-08-31 (×3): 0.25 mg via RESPIRATORY_TRACT
  Filled 2017-08-30 (×5): qty 2

## 2017-08-30 NOTE — Progress Notes (Signed)
New referral for [facility] services at home received from [facility]. Patient is an 84 year old man with a known history of atrial fibrillation, CKD, CHF, lung nodules,coronary artery disease, osteoarthritis, obstructive sleep apnea, hypertension admitted to [facility] on 7/21 for evaluation of shortness of breath. He has had 3 admissions in the past 6 months. His oxygen requirement has been increasing fro 5 to 10 liters also requiring Bipap at night d/t decreased oxygen saturations. Per chart note review patient has lung nodules that have increased  in size as well as a left adrenal nodule that has increased in size. Family has met with oncology, attending hospitalist and PCP and at this time have chosen for patient to return home with the support of hospice services.  Write met in the patient's room with his wife, daughters and son to initiate education regarding hospice services, philosophy and team approach to care with understanding voiced, patient was alert and did participate some in the conversation. DME needs include: 10 liter concentrator, BiPAP and nebulizer machine. Much discussion was had with family and patient regarding the BIPAP, he has a CPAP at home and was satisfied with that, but his wife was concerned that it would "not keep his oxygen saturations up enough". Writer contacted respiratory therapist Iona Beard to obtain the BiPAP settings. DME ordered for delivery tomorrow. Mask to accompany patient home. Patient will need EMS transport with signed DNR in place. Patient information faxed to referral. Hospice information and contact number left with patein's daughter Jan. [facility] care team updated. Will continue to follow through discharge. Flo Shanks RN, BSN, [facility], hospital Liaison [phone]

## 2017-08-30 NOTE — Progress Notes (Signed)
PT Cancellation Note  Patient Details Name: Norman Morales MRN: [id number] DOB: 1926-07-18   Cancelled Treatment:    Reason Eval/Treat Not Completed: Other (comment)(Per Dr. Posey Pronto, plan is likely home with hospice care. PT will follow up at later date if patient is medically appropriate. )   Lieutenant Diego PT, DPT 10:58 AM,08/30/17 [phone]

## 2017-08-30 NOTE — Progress Notes (Addendum)
I discussed the case with patient's primary care provider Dr. Caryl Comes who reports that patient wants to continue to be treated in hopes of going home.  I was updated later that family and patient ok with him going home with hospices on Wednesday.

## 2017-08-30 NOTE — Progress Notes (Signed)
RN at bedside to assess pt/ pt has removed his BIPAP stating that "he does not want to wear it"/ 5L 02 [place] applied/ 02 sats remain in the 60s/ Resp called to assess/ HFNC 10L applied/ sats improved for short amount of time/ 02 sats again 76% on 10L HFNC/ Dr. Posey Pronto made aware/ MD stated that he will and pts PCP ( Dr. Caryl Comes) will  have discussion with pt and pts family about plan of care, possible hospice/ MD states " pt is dying"/ MD at bedside with pt and pts son/  will continue to monitor.

## 2017-08-30 NOTE — Care Management Important Message (Signed)
Copy of signed IM left with patient in room.  

## 2017-08-30 NOTE — Consult Note (Signed)
[facility] Nurse wound consult note Reason for Consult:Unna boots to manage edema and weeping.  Skin intact Dressing procedure/placement/frequency:LEgs wrapped with zinc layer and secured with self adherent coban.  Twice weekly.  [facility] team will follow Domenic Moras RN BSN [facility] Pager [phone]

## 2017-08-30 NOTE — Care Management (Signed)
Patient admitted from home with Acute on chronic hypoxic respiratory failure.  Patient CT scan suggestive of bronchogenic cancer.  Patient lives at home with wife.  Patient's 2 daughters, and son are at bedside.  PCP Caryl Comes.  [place].  Patient was recently discharged from [facility] in June.  Patient has chronic O2 through [facility].  Patient has BSC, RW, pulse Ox, and lift chairs in the home.  Patient sleeps in a lift chair, and plans to continue when he gets home.  PCP has had a family meeting and plans are for home with hospice.  Hospice agency preference was offered and [facility] was selected.  Referral was made to [facility]. Patient is currently requiring 10 L O2, home concentrator will have to be switched.  Patient/family are requesting wheelchair, and bipap in the home.  Santiago Glad notified.  Per MD anticipate home with hospice on Wednesday. RNCM following

## 2017-08-30 NOTE — Progress Notes (Signed)
Patient ID: Norman Morales, male   DOB: 10-20-1926, 84 y.o.   MRN: [id number]  Norman PROGRESS NOTE  Norman Morales [id number] DOB: 1927/01/31 DOA: 08/22/2017 PCP: Norman Hector, MD  HPI/Subjective: Patient earlier this morning could not keep his saturation up and was sleepy however at the time of my evaluation he was more awake and communicating. I was contacted by his primary care provider stating that he will be meeting with the family at 11 recommending hospice home  Objective: Vitals:   08/30/17 0850 08/30/17 1048  BP:    Pulse:    Resp:    Temp:    SpO2: 91% 96%    Filed Weights   08/28/17 0416 08/29/17 0350 08/30/17 0501  Weight: 126.6 kg (279 lb 3.2 oz) 126.5 kg (278 lb 12.8 oz) 128.7 kg (283 lb 12.8 oz)    ROS: Review of Systems  Constitutional: Positive for malaise/fatigue. Negative for chills and fever.  Eyes: Negative for blurred vision.  Respiratory: Positive for cough and shortness of breath. Negative for stridor.   Cardiovascular: Negative for chest pain.  Gastrointestinal: Negative for abdominal pain, constipation, diarrhea, nausea and vomiting.  Genitourinary: Negative for dysuria.  Musculoskeletal: Negative for joint pain.  Skin: Negative for rash.  Neurological: Negative for dizziness and headaches.  Psychiatric/Behavioral: Negative for depression. The patient is not nervous/anxious.    Exam: Physical Exam  Constitutional: He is oriented to person, place, and time.  HENT:  Nose: No mucosal edema.  Mouth/Throat: No oropharyngeal exudate or posterior oropharyngeal edema.  Eyes: Pupils are equal, round, and reactive to light. Conjunctivae, EOM and lids are normal.  Neck: No JVD present. Carotid bruit is not present. No edema present. No thyroid mass and no thyromegaly present.  Cardiovascular: S1 normal and S2 normal. Exam reveals no gallop.  No murmur heard. Pulses:      Dorsalis pedis pulses are 2+ on the right side, and 2+ on the  left side.  Respiratory: Effort normal. No respiratory distress. He has decreased breath sounds. He has no wheezes. He has no rhonchi. He has rales.  GI: Soft. Bowel sounds are normal. There is no tenderness.  Musculoskeletal: He exhibits edema.       Right ankle: He exhibits swelling.       Left ankle: He exhibits swelling.  Lymphadenopathy:    He has no cervical adenopathy.  Neurological: He is alert and oriented to person, place, and time. No cranial nerve deficit.  Skin: Skin is warm. Nails show no clubbing.  Chronic lower extremity discoloration.  Weeping edema left leg.  Psychiatric: He has a normal mood and affect.      Data Reviewed: Basic Metabolic Panel: Recent Labs  Lab 08/26/17 0428 08/26/17 1106 08/27/17 0730 08/27/17 1445 08/28/17 0404  NA 139 136 135 136 136  K 5.7* 5.0 5.6* 5.3* 5.5*  CL 94* 93* 92* 93* 93*  CO2 37* 36* 37* 38* 36*  GLUCOSE 185* 323* 203* 193* 146*  BUN 52* 53* 57* 56* 59*  CREATININE 1.23 1.30* 1.38* 1.26* 1.25*  CALCIUM 8.8* 8.7* 8.7* 8.8* 8.8*  MG  --   --  2.6*  --   --    Liver Function Tests: No results for input(s): AST, ALT, ALKPHOS, BILITOT, PROT, ALBUMIN in the last 168 hours. CBC: No results for input(s): WBC, NEUTROABS, HGB, HCT, MCV, PLT in the last 168 hours. Cardiac Enzymes: Recent Labs  Lab 08/28/17 0714 08/28/17 1022 08/28/17 1856  TROPONINI 0.05*  0.05* 0.05*   BNP (last 3 results) Recent Labs    07/13/17 1416 08/16/17 1533 08/18/17 1206  BNP 312.0* 321.0* 273.0*      Recent Results (from the past 240 hour(s))  Culture, blood (routine x 2) Call MD if unable to obtain prior to antibiotics being given     Status: None   Collection Time: 08/23/17  2:58 AM  Result Value Ref Range Status   Specimen Description BLOOD LEFT ANTECUBITAL  Final   Special Requests   Final    BOTTLES DRAWN AEROBIC AND ANAEROBIC Blood Culture adequate volume   Culture   Final    NO GROWTH 5 DAYS Performed at [facility],  [address] [place], [place] [address]    Report Status 08/28/2017 FINAL  Final  Culture, blood (routine x 2) Call MD if unable to obtain prior to antibiotics being given     Status: None   Collection Time: 08/23/17  3:04 AM  Result Value Ref Range Status   Specimen Description BLOOD RIGHT ANTECUBITAL  Final   Special Requests   Final    BOTTLES DRAWN AEROBIC AND ANAEROBIC Blood Culture adequate volume   Culture   Final    NO GROWTH 5 DAYS Performed at [facility], [address] [place], [place] [address]    Report Status 08/28/2017 FINAL  Final  MRSA PCR Screening     Status: None   Collection Time: 08/23/17  1:30 PM  Result Value Ref Range Status   MRSA by PCR NEGATIVE NEGATIVE Final    Comment:        The GeneXpert MRSA Assay (FDA approved for NASAL specimens only), is one component of a comprehensive MRSA colonization surveillance program. It is not intended to diagnose MRSA infection nor to guide or monitor treatment for MRSA infections. Performed at [facility], [address] [place], [place] [address]       Scheduled Meds: . allopurinol  100 mg Oral Daily  . atorvastatin  80 mg Oral QHS  . chlorpheniramine-HYDROcodone  5 mL Oral Q12H  . furosemide  40 mg Intravenous BID  . insulin aspart  0-5 Units Subcutaneous QHS  . insulin aspart  0-9 Units Subcutaneous TID WC  . methylPREDNISolone (SOLU-MEDROL) injection  40 mg Intravenous Q12H  . traZODone  50 mg Oral QHS   Continuous Infusions: . ceFEPime (MAXIPIME) IV Stopped (08/30/17 0622)    Assessment/Plan:  1. Acute on chronic hypoxic respiratory failure.  Patient CT scan suggestive of bronchogenic cancer prognosis very poor near-term mortality very high.  I have discussed with the family yesterday regarding overall prognosis recommend hospice home.  Patient's primary care provider will be discussing with the family regarding this. 2. COPD exacerbation continue Solu-Medrol 40 mg IV  twice daily.  Continue nebulizer treatments.  Continue Pulmicort 3. Left lower lobe pneumonia.  MRSA PCR negative,  Continue cefepime.  CT scan changes consistent with likely inflammatory changes unclear if this is pneumonia.  Procalcitonin level is normal. 4. Acute on chronic diastolic congestive heart failure.  Continue diuresis with IV Lasix 40 mg IV twice daily. 5. Chronic kidney disease stage III.  Watch with diuresis. 6. Chronic atrial fibrillation on Eliquis for anticoagulation and metoprolol for rate control 7. Hyperlipidemia unspecified on atorvastatin 8. Insomnia continue trazodone   Code Status:     Code Status Orders  (From admission, onward)        Start     Ordered   08/23/17 0211  Full code  Continuous     08/23/17 0210    Code Status History    Date Active Date Inactive Code Status Order ID Comments User Context   08/18/2017 1828 08/22/2017 1725 Full Code [id number]  Gladstone Lighter, MD Inpatient   06/04/2017 1414 06/10/2017 1744 DNR [id number]  Hillary Bow, MD Inpatient   06/04/2017 1244 06/04/2017 1414 Full Code [id number]  Hillary Bow, MD Inpatient   12/23/2016 0806 12/27/2016 1617 Full Code [id number]  Lance Coon, MD Inpatient   12/23/2016 0714 12/23/2016 0806 Full Code [id number]  Hillary Bow, MD ED   12/30/2015 1429 12/30/2015 1859 Full Code [id number]  Dew, Erskine Squibb, MD Inpatient    Advance Directive Documentation     Most Recent Value  Type of Advance Directive  Living will  Pre-existing out of facility DNR order (yellow form or pink MOST form)  -  "MOST" Form in Place?  -     Family Communication: His wife and son at the bedside Disposition Plan:  Consultants:  Cardiology  Antibiotics:  Cefepime  Time spent: 35 minutes  [place]

## 2017-08-31 ENCOUNTER — Ambulatory Visit: Payer: Medicare Other | Admitting: Family

## 2017-08-31 LAB — GLUCOSE, CAPILLARY
GLUCOSE-CAPILLARY: 146 mg/dL — AB (ref 70–99)
Glucose-Capillary: 164 mg/dL — ABNORMAL HIGH (ref 70–99)
Glucose-Capillary: 155 mg/dL — ABNORMAL HIGH (ref 70–99)
Glucose-Capillary: 194 mg/dL — ABNORMAL HIGH (ref 70–99)

## 2017-08-31 LAB — URINALYSIS, ROUTINE W REFLEX MICROSCOPIC
BILIRUBIN URINE: NEGATIVE
Glucose, UA: NEGATIVE mg/dL
Ketones, ur: NEGATIVE mg/dL
Leukocytes, UA: NEGATIVE
NITRITE: NEGATIVE
PH: 5 (ref 5.0–8.0)
Protein, ur: NEGATIVE mg/dL
SPECIFIC GRAVITY, URINE: 1.013 (ref 1.005–1.030)

## 2017-08-31 MED ORDER — OXYCODONE HCL 5 MG PO TABS
5.0000 mg | ORAL_TABLET | ORAL | Status: DC | PRN
  Administered 2017-08-31 – 2017-09-01 (×7): 5 mg via ORAL
  Filled 2017-08-31 (×7): qty 1

## 2017-08-31 MED ORDER — PHENAZOPYRIDINE HCL 200 MG PO TABS
200.0000 mg | ORAL_TABLET | Freq: Three times a day (TID) | ORAL | Status: DC
  Administered 2017-08-31 – 2017-09-01 (×3): 200 mg via ORAL
  Filled 2017-08-31 (×4): qty 1

## 2017-08-31 MED ORDER — GUAIFENESIN-DM 100-10 MG/5ML PO SYRP
5.0000 mL | ORAL_SOLUTION | ORAL | Status: DC | PRN
  Administered 2017-08-31: 5 mL via ORAL
  Filled 2017-08-31 (×2): qty 5

## 2017-08-31 NOTE — Progress Notes (Signed)
Pt has been c/o cramping/pain to left hip/leg since the beginning of shift, this nurse spoke to MD [facility] and obtained a verbal order for Robaxin 500 mg PO once earlier on shift. Pt stated that Robaxin "helped a little but the pain is still there". Spoke to doctor Willis and obtained an order for Oxycodone 5 mg every 4 hours PRN for moderate pain.  Pt BLEs are wrapped with coban, popliteal pulse is present on left extremity, extremity is warm to touch. Family at bedside are expressing concerns that pt is not on any anticoagulant. Educated family that Eliquis was stopped due to pt coughing up blood. Did advice family to be present during MD rounding in the morning. MD made aware. Will continue to monitor.

## 2017-08-31 NOTE — Progress Notes (Signed)
Follow up visit made to new referral for [facility] services at home. Patient seen sitting up in the recliner, wife and daughter Jan at bedside. Patient appears more confused at this visit today. Writer answered questions regarding DME ordered and delivery. Plan is for discharge home tomorrow via EMS. DME to be delivered today. Family needed time to arrange 24 hr private care for the patient. Emotional support given. Hospital team updated. Will continue to follow through final disposition. Flo Shanks RN, BSN, [place] [facility], hospital liasion [phone]

## 2017-08-31 NOTE — Progress Notes (Signed)
Patient ID: Norman Morales, male   DOB: [birth year], 84 y.o.   MRN: [id number]  [place] PROGRESS NOTE  Norman Morales [id number] DOB: [birth year] DOA: 08/22/2017 PCP: Adin Hector, MD  HPI/Subjective:  Patient still requiring 10 L of oxygen. Has intermittent shortness of breath  Objective: Vitals:   08/31/17 0747 08/31/17 0754  BP: (!) 161/94   Pulse: (!) 115   Resp:    Temp:    SpO2:  100%    Filed Weights   08/28/17 0416 08/29/17 0350 08/30/17 0501  Weight: 126.6 kg (279 lb 3.2 oz) 126.5 kg (278 lb 12.8 oz) 128.7 kg (283 lb 12.8 oz)    ROS: Review of Systems  Constitutional: Positive for malaise/fatigue. Negative for chills and fever.  Eyes: Negative for blurred vision.  Respiratory: Positive for cough and shortness of breath. Negative for stridor.   Cardiovascular: Negative for chest pain.  Gastrointestinal: Negative for abdominal pain, constipation, diarrhea, nausea and vomiting.  Genitourinary: Negative for dysuria.  Musculoskeletal: Negative for joint pain.  Skin: Negative for rash.  Neurological: Negative for dizziness and headaches.  Psychiatric/Behavioral: Negative for depression. The patient is not nervous/anxious.    Exam: Physical Exam  Constitutional: He is oriented to person, place, and time.  HENT:  Nose: No mucosal edema.  Mouth/Throat: No oropharyngeal exudate or posterior oropharyngeal edema.  Eyes: Pupils are equal, round, and reactive to light. Conjunctivae, EOM and lids are normal.  Neck: No JVD present. Carotid bruit is not present. No edema present. No thyroid mass and no thyromegaly present.  Cardiovascular: S1 normal and S2 normal. Exam reveals no gallop.  No murmur heard. Pulses:      Dorsalis pedis pulses are 2+ on the right side, and 2+ on the left side.  Respiratory: Effort normal. No respiratory distress. He has decreased breath sounds. He has no wheezes. He has no rhonchi. He has rales.  GI: Soft. Bowel sounds are  normal. There is no tenderness.  Musculoskeletal: He exhibits edema.       Right ankle: He exhibits swelling.       Left ankle: He exhibits swelling.  Lymphadenopathy:    He has no cervical adenopathy.  Neurological: He is alert and oriented to person, place, and time. No cranial nerve deficit.  Skin: Skin is warm. Nails show no clubbing.  Chronic lower extremity discoloration.  Weeping edema left leg.  Psychiatric: He has a normal mood and affect.      Data Reviewed: Basic Metabolic Panel: Recent Labs  Lab 08/26/17 0428 08/26/17 1106 08/27/17 0730 08/27/17 1445 08/28/17 0404  NA 139 136 135 136 136  K 5.7* 5.0 5.6* 5.3* 5.5*  CL 94* 93* 92* 93* 93*  CO2 37* 36* 37* 38* 36*  GLUCOSE 185* 323* 203* 193* 146*  BUN 52* 53* 57* 56* 59*  CREATININE 1.23 1.30* 1.38* 1.26* 1.25*  CALCIUM 8.8* 8.7* 8.7* 8.8* 8.8*  MG  --   --  2.6*  --   --    Liver Function Tests: No results for input(s): AST, ALT, ALKPHOS, BILITOT, PROT, ALBUMIN in the last 168 hours. CBC: No results for input(s): WBC, NEUTROABS, HGB, HCT, MCV, PLT in the last 168 hours. Cardiac Enzymes: Recent Labs  Lab 08/28/17 0714 08/28/17 1022 08/28/17 1856  TROPONINI 0.05* 0.05* 0.05*   BNP (last 3 results) Recent Labs    07/13/17 1416 08/16/17 1533 08/18/17 1206  BNP 312.0* 321.0* 273.0*      Recent Results (from the  past 240 hour(s))  Culture, blood (routine x 2) Call MD if unable to obtain prior to antibiotics being given     Status: None   Collection Time: 08/23/17  2:58 AM  Result Value Ref Range Status   Specimen Description BLOOD LEFT ANTECUBITAL  Final   Special Requests   Final    BOTTLES DRAWN AEROBIC AND ANAEROBIC Blood Culture adequate volume   Culture   Final    NO GROWTH 5 DAYS Performed at [facility], [address] [place], [place] [address]    Report Status 08/28/2017 FINAL  Final  Culture, blood (routine x 2) Call MD if unable to obtain prior to antibiotics being given      Status: None   Collection Time: 08/23/17  3:04 AM  Result Value Ref Range Status   Specimen Description BLOOD RIGHT ANTECUBITAL  Final   Special Requests   Final    BOTTLES DRAWN AEROBIC AND ANAEROBIC Blood Culture adequate volume   Culture   Final    NO GROWTH 5 DAYS Performed at [facility], [address] [place], [place] [address]    Report Status 08/28/2017 FINAL  Final  MRSA PCR Screening     Status: None   Collection Time: 08/23/17  1:30 PM  Result Value Ref Range Status   MRSA by PCR NEGATIVE NEGATIVE Final    Comment:        The GeneXpert MRSA Assay (FDA approved for NASAL specimens only), is one component of a comprehensive MRSA colonization surveillance program. It is not intended to diagnose MRSA infection nor to guide or monitor treatment for MRSA infections. Performed at [facility], [address] [place], [place] [address]       Scheduled Meds: . allopurinol  100 mg Oral Daily  . atorvastatin  80 mg Oral QHS  . budesonide (PULMICORT) nebulizer solution  0.25 mg Nebulization BID  . chlorpheniramine-HYDROcodone  5 mL Oral Q12H  . furosemide  40 mg Intravenous BID  . insulin aspart  0-5 Units Subcutaneous QHS  . insulin aspart  0-9 Units Subcutaneous TID WC  . methylPREDNISolone (SOLU-MEDROL) injection  40 mg Intravenous Q12H  . traZODone  50 mg Oral QHS   Continuous Infusions: . ceFEPime (MAXIPIME) IV Stopped (08/31/17 0630)    Assessment/Plan:  1. Acute on chronic hypoxic respiratory failure.  Patient CT scan suggestive of bronchogenic cancer prognosis very poor near-term mortality very high.  Plan for home with hospice tomorrow 2. COPD exacerbation continue Solu-Medrol 40 mg IV twice daily.  Continue nebulizer treatments.  Continue Pulmicort 3. Left lower lobe pneumonia.  MRSA PCR negative,  Continue cefepime.  CT scan changes consistent with likely inflammatory changes unclear if this is pneumonia.  Procalcitonin level is  normal. 4. Acute on chronic diastolic congestive heart failure.  Continue diuresis with IV Lasix 40 mg IV twice daily. 5. Chronic kidney disease stage III.  Watch with diuresis. 6. Chronic atrial fibrillation on Eliquis for anticoagulation and metoprolol for rate control 7. Hyperlipidemia unspecified on atorvastatin 8. Insomnia continue trazodone   Code Status:     Code Status Orders  (From admission, onward)        Start     Ordered   08/23/17 0211  Full code  Continuous     08/23/17 0210    Code Status History    Date Active Date Inactive Code Status Order ID Comments User Context   08/18/2017 1828 08/22/2017 1725 Full Code [id number]  Tressia Miners,  Hart [place], MD Inpatient   06/04/2017 1414 06/10/2017 1744 DNR [id number]  Hillary Bow, MD Inpatient   06/04/2017 1244 06/04/2017 1414 Full Code [id number]  Hillary Bow, MD Inpatient   12/23/2016 0806 12/27/2016 1617 Full Code [id number]  Lance Coon, MD Inpatient   12/23/2016 0714 12/23/2016 0806 Full Code [id number]  Hillary Bow, MD ED   12/30/2015 1429 12/30/2015 1859 Full Code [id number]  Dew, Erskine Squibb, MD Inpatient    Advance Directive Documentation     Most Recent Value  Type of Advance Directive  Living will  Pre-existing out of facility DNR order (yellow form or pink MOST form)  -  "MOST" Form in Place?  -     Family Communication: His wife and son at the bedside Disposition Plan:  Consultants:  Cardiology  Antibiotics:  Cefepime  Time spent: 32 minutes  [place]

## 2017-08-31 NOTE — Care Management (Addendum)
Patient to discharge home 7/31  with hospice services through [facility].  He will travel by ems.  Currently on 10 liters nasal cannula.FAmily is rearranging furniture and setting up round the clock caregiver/nursing support in the home.

## 2017-09-01 LAB — METANEPHRINES, PLASMA
Metanephrine, Free: 24 pg/mL (ref 0–62)
Normetanephrine, Free: 172 pg/mL — ABNORMAL HIGH (ref 0–145)

## 2017-09-01 LAB — GLUCOSE, CAPILLARY
GLUCOSE-CAPILLARY: 193 mg/dL — AB (ref 70–99)
Glucose-Capillary: 165 mg/dL — ABNORMAL HIGH (ref 70–99)
Glucose-Capillary: 140 mg/dL — ABNORMAL HIGH (ref 70–99)

## 2017-09-01 MED ORDER — GUAIFENESIN-DM 100-10 MG/5ML PO SYRP: 5.0000 mL | ORAL_SOLUTION | ORAL | 0 refills | Status: AC | PRN

## 2017-09-01 MED ORDER — PREDNISONE 20 MG PO TABS: 40.0000 mg | ORAL_TABLET | Freq: Every day | ORAL | 0 refills | Status: AC

## 2017-09-01 MED ORDER — OXYCODONE HCL 5 MG PO TABS: 5.0000 mg | ORAL_TABLET | Freq: Four times a day (QID) | ORAL | 0 refills | Status: AC | PRN

## 2017-09-01 MED ORDER — FLEET ENEMA 7-19 GM/118ML RE ENEM: 1.0000 | ENEMA | Freq: Once | RECTAL | Status: DC

## 2017-09-01 NOTE — Discharge Summary (Signed)
[facility] NAME: Norman Morales    MR#:  [id number]  [place] OF BIRTH:  Feb 24, 1926  DATE OF ADMISSION:  08/22/2017   ADMITTING PHYSICIAN: Amelia Jo, MD  DATE OF DISCHARGE: 09/01/2017  PRIMARY CARE PHYSICIAN: Tama High III, MD   ADMISSION DIAGNOSIS:  SOB (shortness of breath) [R06.02] Acute on chronic respiratory failure with hypoxia (HCC) [J96.21] DISCHARGE DIAGNOSIS:  Active Problems:   Acute exacerbation of CHF (congestive heart failure) ([place])  SECONDARY DIAGNOSIS:   Past Medical History:  Diagnosis Date  . A-fib ([place])   . Adrenal adenoma   . CHF (congestive heart failure) ([place])   . Coronary artery disease   . DDD (degenerative disc disease), cervical   . Hyperlipidemia   . Hypertension   . Lung nodule   . Restrictive lung disease    HOSPITAL COURSE:  Norman Morales  is a 84 y.o. male with a known history of atrial fibrillation, CKD, CHF, coronary artery disease, osteoarthritis, obstructive sleep apnea, hypertension and other comorbidities. Patient presented to emergency room for worsening shortness of breath, associated with occasional productive cough and hypoxia going on for the past, few hours, gradually getting worse.   1. Acute on chronic hypoxic respiratory failure.  Patient CT scan suggestive of bronchogenic cancer prognosis very poor near-term mortality very high.  Plan for home with hospice. 2. COPD exacerbation. He is treated with Solu-Medrol 40 mg IV twice daily.  Continue nebulizer treatments.  Change to p.o. prednisone.   3. Left lower lobe pneumonia.  MRSA PCR negative, he has been treated with cefepime.  CT scan changes consistent with likely inflammatory changes unclear if this is pneumonia.  Procalcitonin level is normal.  Discontinue antibiotics. 4. Acute on chronic diastolic congestive heart failure.  He has been treated with with IV Lasix 40 mg IV twice daily.  Change to p.o. 5. Chronic kidney  disease stage III.  Watch with diuresis. 6. Chronic atrial fibrillation on Eliquis for anticoagulation at home and metoprolol for rate control. Eliquis.was hold.  7. Hyperlipidemia unspecified on atorvastatin. Discontinue. 8. Insomnia. On trazodone in the hospital. 9. Urinary retention.  Keep Foley catheter on discharge. DISCHARGE CONDITIONS:  Poor prognosis, discharged to home with hospice care. CONSULTS OBTAINED:  Treatment Team:  Yolonda Kida, MD Demetrios Loll, MD Sindy Guadeloupe, MD DRUG ALLERGIES:   Allergies  Allergen Reactions  . Ambien [Zolpidem Tartrate] Other (See Comments)    Nightmares/ yelling out    DISCHARGE MEDICATIONS:   Allergies as of 09/01/2017      Reactions   Ambien [zolpidem Tartrate] Other (See Comments)   Nightmares/ yelling out      Medication List    STOP taking these medications   allopurinol 100 MG tablet Commonly known as:  ZYLOPRIM   apixaban 2.5 MG Tabs tablet Commonly known as:  ELIQUIS   atorvastatin 80 MG tablet Commonly known as:  LIPITOR   cefdinir 300 MG capsule Commonly known as:  OMNICEF   CoQ-10 200 MG Caps     TAKE these medications   albuterol 108 (90 Base) MCG/ACT inhaler Commonly known as:  PROVENTIL HFA;VENTOLIN HFA Inhale 2 puffs into the lungs every 4 (four) hours as needed for wheezing or shortness of breath.   ammonium lactate 12 % cream Commonly known as:  AMLACTIN Apply topically 2 (two) times daily as needed for dry skin.   ANORO ELLIPTA 62.5-25 MCG/INH Aepb Generic drug:  umeclidinium-vilanterol Inhale 1 puff  into the lungs daily.   azelastine 0.1 % nasal spray Commonly known as:  ASTELIN Place 1-2 sprays into the nose 2 (two) times daily as needed for rhinitis.   gabapentin 300 MG capsule Commonly known as:  NEURONTIN Take 1 capsule (300MG ) by mouth every morning and 2 capsules (600MG ) by mouth every evening   guaiFENesin-dextromethorphan 100-10 MG/5ML syrup Commonly known as:  ROBITUSSIN  DM Take 5 mLs by mouth every 4 (four) hours as needed for cough.   Melatonin 10 MG Tabs Take 20 mg by mouth daily as needed (sleep).   metoprolol tartrate 50 MG tablet Commonly known as:  LOPRESSOR Take 50 mg by mouth 2 (two) times daily.   oxyCODONE 5 MG immediate release tablet Commonly known as:  Oxy IR/ROXICODONE Take 1 tablet (5 mg total) by mouth every 6 (six) hours as needed for up to 5 days for moderate pain, severe pain or breakthrough pain.   polyethylene glycol packet Commonly known as:  MIRALAX / GLYCOLAX Take 17 g by mouth daily as needed for mild constipation.   predniSONE 20 MG tablet Commonly known as:  DELTASONE Take 2 tablets (40 mg total) by mouth daily with breakfast.   spironolactone 25 MG tablet Commonly known as:  ALDACTONE Take 25 mg by mouth every other day.   torsemide 20 MG tablet Commonly known as:  DEMADEX Take 2 tablets (40 mg total) by mouth 2 (two) times daily.        DISCHARGE INSTRUCTIONS:  See AVS.  If you experience worsening of your admission symptoms, develop shortness of breath, life threatening emergency, suicidal or homicidal thoughts you must seek medical attention immediately by calling 911 or calling your MD immediately  if symptoms less severe.  You Must read complete instructions/literature along with all the possible adverse reactions/side effects for all the Medicines you take and that have been prescribed to you. Take any new Medicines after you have completely understood and accpet all the possible adverse reactions/side effects.   Please note  You were cared for by a hospitalist during your hospital stay. If you have any questions about your discharge medications or the care you received while you were in the hospital after you are discharged, you can call the unit and asked to speak with the hospitalist on call if the hospitalist that took care of you is not available. Once you are discharged, your primary care physician  will handle any further medical issues. Please note that NO REFILLS for any discharge medications will be authorized once you are discharged, as it is imperative that you return to your primary care physician (or establish a relationship with a primary care physician if you do not have one) for your aftercare needs so that they can reassess your need for medications and monitor your lab values.    On the day of Discharge:  VITAL SIGNS:  Blood pressure (!) 163/88, pulse (!) 103, temperature 98.2 F (36.8 C), temperature source Oral, resp. rate 18, height 6' (1.829 m), weight 283 lb 12.8 oz (128.7 kg), SpO2 94 %. PHYSICAL EXAMINATION:  GENERAL:  84 y.o.-year-old patient lying in the bed with no acute distress.  Obesity. EYES: Pupils equal, round, reactive to light and accommodation. No scleral icterus. Extraocular muscles intact.  HEENT: Head atraumatic, normocephalic. Oropharynx and nasopharynx clear.  NECK:  Supple, no jugular venous distention. No thyroid enlargement, no tenderness.  LUNGS: Crackles bilaterally, no wheezing, rales,rhonchi or crepitation. No use of accessory muscles of respiration.  CARDIOVASCULAR:  S1, S2 normal. No murmurs, rubs, or gallops.  ABDOMEN: Soft, non-tender, non-distended. Bowel sounds present. No organomegaly or mass.  EXTREMITIES: No pedal edema, cyanosis, or clubbing.  NEUROLOGIC: Cranial nerves II through XII are intact. Muscle strength 3-4/5 in all extremities. Sensation intact. Gait not checked.  PSYCHIATRIC: The patient is alert and oriented x 3.  SKIN: No obvious rash, lesion, or ulcer.  DATA REVIEW:   CBC No results for input(s): WBC, HGB, HCT, PLT in the last 168 hours.  Chemistries  Recent Labs  Lab 08/27/17 0730  08/28/17 0404  NA 135   < > 136  K 5.6*   < > 5.5*  CL 92*   < > 93*  CO2 37*   < > 36*  GLUCOSE 203*   < > 146*  BUN 57*   < > 59*  CREATININE 1.38*   < > 1.25*  CALCIUM 8.7*   < > 8.8*  MG 2.6*  --   --    < > = values in this  interval not displayed.     Microbiology Results  Results for orders placed or performed during the hospital encounter of 08/22/17  Culture, blood (routine x 2) Call MD if unable to obtain prior to antibiotics being given     Status: None   Collection Time: 08/23/17  2:58 AM  Result Value Ref Range Status   Specimen Description BLOOD LEFT ANTECUBITAL  Final   Special Requests   Final    BOTTLES DRAWN AEROBIC AND ANAEROBIC Blood Culture adequate volume   Culture   Final    NO GROWTH 5 DAYS Performed at [facility], [address], [place], [place] [address]    Report Status 08/28/2017 FINAL  Final  Culture, blood (routine x 2) Call MD if unable to obtain prior to antibiotics being given     Status: None   Collection Time: 08/23/17  3:04 AM  Result Value Ref Range Status   Specimen Description BLOOD RIGHT ANTECUBITAL  Final   Special Requests   Final    BOTTLES DRAWN AEROBIC AND ANAEROBIC Blood Culture adequate volume   Culture   Final    NO GROWTH 5 DAYS Performed at [facility], [address] [place], [place] [address]    Report Status 08/28/2017 FINAL  Final  MRSA PCR Screening     Status: None   Collection Time: 08/23/17  1:30 PM  Result Value Ref Range Status   MRSA by PCR NEGATIVE NEGATIVE Final    Comment:        The GeneXpert MRSA Assay (FDA approved for NASAL specimens only), is one component of a comprehensive MRSA colonization surveillance program. It is not intended to diagnose MRSA infection nor to guide or monitor treatment for MRSA infections. Performed at [facility], [address] [place], [place] [address]     RADIOLOGY:  No results found.   Management plans discussed with the patient, his wife and daughters and they are in agreement.  CODE STATUS: DNR   TOTAL TIME TAKING CARE OF THIS PATIENT: 42 minutes.    Demetrios Loll M.D on 09/01/2017 at 4:12 PM  Between 7am to 6pm - Pager - [phone]  After 6pm go to  [email or web address] - password [email or web address]  [facility]  Office  [phone]  CC: Primary care physician; Adin Hector, MD   Note: This dictation was prepared with Dragon dictation along with smaller phrase technology. Any transcriptional errors  that result from this process are unintentional.

## 2017-09-01 NOTE — Care Management Important Message (Signed)
Copy of signed IM left with patient in room.  

## 2017-09-01 NOTE — Discharge Instructions (Signed)
Hospice care at home. °

## 2017-09-01 NOTE — Plan of Care (Signed)
Patient is being discharge home with hospice care at this time

## 2017-09-01 NOTE — Progress Notes (Signed)
Patient is being discharge home with hospice, EMS called awaiting for transport , family at bedside, discharge medication and discharge instruction  reviewed with family, Iv removed.

## 2017-09-01 NOTE — Care Management (Addendum)
Family has just  changed their mind about the need for a hospital bed.  They and patient would like the bed and must be in place prior to arrival home.  Notified Santiago Glad , liaison and it will be ordered.  Notified karen that family requests a gel  pad over the mattress. This  request for DME may delay discharge.

## 2017-09-01 NOTE — Progress Notes (Addendum)
16 th french foley catheter inserted for urinary retention , patient tolerated

## 2017-09-01 NOTE — Progress Notes (Signed)
Writer was notified by [facility] Joni Reining that family would like a hospital bed. DME ordered for delivery as soon as possible, wide wheel chair also ordered. Per Nann patient will discharge when bed is in place. Flo Shanks RN, BSN, [place] [facility], hospital Liaison [phone]

## 2017-09-01 NOTE — Progress Notes (Signed)
Follow up visit made to new referral for hospice services at home. Patient seen sitting up in the recliner, all family at bedside. Patient remains on 10 liters of oxygen, BIPAP used last evening. Plan continues for discharge via EMS today. All DME in place. Please send BIPAP mask home with patient, staff RN and aide and family aware of this. Singed DNR in place for transport. Referral updated. Flo Shanks RN, BSN, [facility] [place], hospital liaison [phone]

## 2017-09-02 LAB — URINE CULTURE

## 2017-09-06 ENCOUNTER — Ambulatory Visit: Payer: Medicare Other | Admitting: Family

## 2017-10-03 DEATH — deceased
